# Patient Record
Sex: Male | Born: 1960 | Race: White | Hispanic: No | Marital: Single | Smoking: Current some day smoker
Health system: Southern US, Community
[De-identification: ages and names within clinical notes are randomized; demographics above are authoritative.]

## PROBLEM LIST (undated history)

## (undated) DIAGNOSIS — I872 Venous insufficiency (chronic) (peripheral): Secondary | ICD-10-CM

## (undated) DIAGNOSIS — J449 Chronic obstructive pulmonary disease, unspecified: Secondary | ICD-10-CM

## (undated) DIAGNOSIS — B20 Human immunodeficiency virus [HIV] disease: Secondary | ICD-10-CM

## (undated) DIAGNOSIS — F329 Major depressive disorder, single episode, unspecified: Secondary | ICD-10-CM

## (undated) DIAGNOSIS — Z21 Asymptomatic human immunodeficiency virus [HIV] infection status: Secondary | ICD-10-CM

## (undated) DIAGNOSIS — I472 Ventricular tachycardia, unspecified: Secondary | ICD-10-CM

## (undated) DIAGNOSIS — I341 Nonrheumatic mitral (valve) prolapse: Secondary | ICD-10-CM

## (undated) DIAGNOSIS — L738 Other specified follicular disorders: Secondary | ICD-10-CM

## (undated) DIAGNOSIS — J329 Chronic sinusitis, unspecified: Secondary | ICD-10-CM

## (undated) DIAGNOSIS — J849 Interstitial pulmonary disease, unspecified: Secondary | ICD-10-CM

## (undated) DIAGNOSIS — K7460 Unspecified cirrhosis of liver: Secondary | ICD-10-CM

## (undated) DIAGNOSIS — Q8740 Marfan's syndrome, unspecified: Secondary | ICD-10-CM

## (undated) DIAGNOSIS — H7190 Unspecified cholesteatoma, unspecified ear: Secondary | ICD-10-CM

## (undated) DIAGNOSIS — K219 Gastro-esophageal reflux disease without esophagitis: Secondary | ICD-10-CM

## (undated) DIAGNOSIS — F32A Depression, unspecified: Secondary | ICD-10-CM

## (undated) DIAGNOSIS — B181 Chronic viral hepatitis B without delta-agent: Secondary | ICD-10-CM

## (undated) HISTORY — DX: Venous insufficiency (chronic) (peripheral): I87.2

## (undated) HISTORY — DX: Chronic sinusitis, unspecified: J32.9

## (undated) HISTORY — DX: Chronic viral hepatitis B without delta-agent: B18.1

## (undated) HISTORY — PX: OTHER SURGICAL HISTORY: SHX169

## (undated) HISTORY — DX: Human immunodeficiency virus (HIV) disease: B20

## (undated) HISTORY — DX: Asymptomatic human immunodeficiency virus (hiv) infection status: Z21

## (undated) HISTORY — DX: Unspecified cirrhosis of liver: K74.60

## (undated) HISTORY — DX: Unspecified cholesteatoma, unspecified ear: H71.90

## (undated) HISTORY — DX: Gastro-esophageal reflux disease without esophagitis: K21.9

## (undated) HISTORY — DX: Interstitial pulmonary disease, unspecified: J84.9

## (undated) HISTORY — DX: Other specified follicular disorders: L73.8

## (undated) HISTORY — DX: Chronic obstructive pulmonary disease, unspecified: J44.9

## (undated) HISTORY — DX: Depression, unspecified: F32.A

## (undated) HISTORY — DX: Ventricular tachycardia, unspecified: I47.20

## (undated) HISTORY — DX: Marfan syndrome, unspecified: Q87.40

## (undated) HISTORY — DX: Ventricular tachycardia: I47.2

## (undated) HISTORY — DX: Major depressive disorder, single episode, unspecified: F32.9

## (undated) HISTORY — PX: APPENDECTOMY: SHX54

---

## 1998-04-21 ENCOUNTER — Emergency Department (HOSPITAL_COMMUNITY): Admission: EM | Admit: 1998-04-21 | Discharge: 1998-04-22 | Payer: Self-pay | Admitting: Internal Medicine

## 1999-07-09 ENCOUNTER — Encounter (INDEPENDENT_AMBULATORY_CARE_PROVIDER_SITE_OTHER): Payer: Self-pay | Admitting: *Deleted

## 1999-07-09 LAB — CONVERTED CEMR LAB: CD4 Count: 425 microliters

## 1999-09-29 ENCOUNTER — Inpatient Hospital Stay (HOSPITAL_COMMUNITY): Admission: EM | Admit: 1999-09-29 | Discharge: 1999-09-30 | Payer: Self-pay | Admitting: Emergency Medicine

## 1999-09-29 ENCOUNTER — Encounter: Payer: Self-pay | Admitting: Emergency Medicine

## 2000-03-02 ENCOUNTER — Encounter: Admission: RE | Admit: 2000-03-02 | Discharge: 2000-03-02 | Payer: Self-pay | Admitting: Infectious Diseases

## 2000-03-02 ENCOUNTER — Ambulatory Visit (HOSPITAL_COMMUNITY): Admission: RE | Admit: 2000-03-02 | Discharge: 2000-03-02 | Payer: Self-pay | Admitting: Infectious Diseases

## 2000-05-31 ENCOUNTER — Ambulatory Visit (HOSPITAL_COMMUNITY): Admission: RE | Admit: 2000-05-31 | Discharge: 2000-05-31 | Payer: Self-pay | Admitting: Infectious Diseases

## 2000-05-31 ENCOUNTER — Encounter: Admission: RE | Admit: 2000-05-31 | Discharge: 2000-05-31 | Payer: Self-pay | Admitting: Infectious Diseases

## 2000-08-23 ENCOUNTER — Encounter: Admission: RE | Admit: 2000-08-23 | Discharge: 2000-08-23 | Payer: Self-pay | Admitting: Infectious Diseases

## 2000-08-23 ENCOUNTER — Ambulatory Visit (HOSPITAL_COMMUNITY): Admission: RE | Admit: 2000-08-23 | Discharge: 2000-08-23 | Payer: Self-pay | Admitting: Infectious Diseases

## 2000-10-25 ENCOUNTER — Encounter: Admission: RE | Admit: 2000-10-25 | Discharge: 2000-10-25 | Payer: Self-pay | Admitting: Infectious Diseases

## 2001-04-18 ENCOUNTER — Encounter: Admission: RE | Admit: 2001-04-18 | Discharge: 2001-04-18 | Payer: Self-pay | Admitting: Infectious Diseases

## 2001-04-18 ENCOUNTER — Ambulatory Visit (HOSPITAL_COMMUNITY): Admission: RE | Admit: 2001-04-18 | Discharge: 2001-04-18 | Payer: Self-pay | Admitting: Infectious Diseases

## 2001-07-25 ENCOUNTER — Encounter: Admission: RE | Admit: 2001-07-25 | Discharge: 2001-07-25 | Payer: Self-pay | Admitting: Infectious Diseases

## 2001-07-25 ENCOUNTER — Ambulatory Visit (HOSPITAL_COMMUNITY): Admission: RE | Admit: 2001-07-25 | Discharge: 2001-07-25 | Payer: Self-pay | Admitting: Infectious Diseases

## 2002-01-01 ENCOUNTER — Encounter: Admission: RE | Admit: 2002-01-01 | Discharge: 2002-01-01 | Payer: Self-pay | Admitting: Internal Medicine

## 2002-01-01 ENCOUNTER — Ambulatory Visit (HOSPITAL_COMMUNITY): Admission: RE | Admit: 2002-01-01 | Discharge: 2002-01-01 | Payer: Self-pay | Admitting: Infectious Diseases

## 2002-01-16 ENCOUNTER — Encounter: Admission: RE | Admit: 2002-01-16 | Discharge: 2002-01-16 | Payer: Self-pay | Admitting: Infectious Diseases

## 2002-04-03 ENCOUNTER — Encounter: Admission: RE | Admit: 2002-04-03 | Discharge: 2002-04-03 | Payer: Self-pay | Admitting: Infectious Diseases

## 2002-04-03 ENCOUNTER — Ambulatory Visit (HOSPITAL_COMMUNITY): Admission: RE | Admit: 2002-04-03 | Discharge: 2002-04-03 | Payer: Self-pay | Admitting: Infectious Diseases

## 2002-06-18 ENCOUNTER — Encounter: Payer: Self-pay | Admitting: Emergency Medicine

## 2002-06-18 ENCOUNTER — Emergency Department (HOSPITAL_COMMUNITY): Admission: EM | Admit: 2002-06-18 | Discharge: 2002-06-18 | Payer: Self-pay | Admitting: Emergency Medicine

## 2002-07-08 ENCOUNTER — Ambulatory Visit (HOSPITAL_COMMUNITY): Admission: RE | Admit: 2002-07-08 | Discharge: 2002-07-08 | Payer: Self-pay | Admitting: Infectious Diseases

## 2002-07-08 ENCOUNTER — Encounter: Admission: RE | Admit: 2002-07-08 | Discharge: 2002-07-08 | Payer: Self-pay | Admitting: Infectious Diseases

## 2002-07-24 ENCOUNTER — Ambulatory Visit (HOSPITAL_COMMUNITY): Admission: RE | Admit: 2002-07-24 | Discharge: 2002-07-24 | Payer: Self-pay | Admitting: Infectious Diseases

## 2002-07-24 ENCOUNTER — Encounter: Payer: Self-pay | Admitting: Cardiology

## 2002-08-06 ENCOUNTER — Encounter: Admission: RE | Admit: 2002-08-06 | Discharge: 2002-08-06 | Payer: Self-pay | Admitting: Infectious Diseases

## 2002-10-30 ENCOUNTER — Encounter: Admission: RE | Admit: 2002-10-30 | Discharge: 2002-10-30 | Payer: Self-pay | Admitting: Infectious Diseases

## 2002-10-30 ENCOUNTER — Encounter: Payer: Self-pay | Admitting: Infectious Diseases

## 2003-01-29 ENCOUNTER — Ambulatory Visit (HOSPITAL_COMMUNITY): Admission: RE | Admit: 2003-01-29 | Discharge: 2003-01-29 | Payer: Self-pay | Admitting: Infectious Diseases

## 2003-01-29 ENCOUNTER — Encounter: Payer: Self-pay | Admitting: Infectious Diseases

## 2003-01-29 ENCOUNTER — Encounter: Admission: RE | Admit: 2003-01-29 | Discharge: 2003-01-29 | Payer: Self-pay | Admitting: Infectious Diseases

## 2003-03-11 ENCOUNTER — Emergency Department (HOSPITAL_COMMUNITY): Admission: EM | Admit: 2003-03-11 | Discharge: 2003-03-11 | Payer: Self-pay

## 2003-04-30 ENCOUNTER — Encounter: Admission: RE | Admit: 2003-04-30 | Discharge: 2003-04-30 | Payer: Self-pay | Admitting: Infectious Diseases

## 2003-04-30 ENCOUNTER — Encounter: Payer: Self-pay | Admitting: Infectious Diseases

## 2003-04-30 ENCOUNTER — Ambulatory Visit (HOSPITAL_COMMUNITY): Admission: RE | Admit: 2003-04-30 | Discharge: 2003-04-30 | Payer: Self-pay | Admitting: Infectious Diseases

## 2003-07-30 ENCOUNTER — Encounter: Admission: RE | Admit: 2003-07-30 | Discharge: 2003-07-30 | Payer: Self-pay | Admitting: Infectious Diseases

## 2003-08-06 ENCOUNTER — Ambulatory Visit (HOSPITAL_COMMUNITY): Admission: RE | Admit: 2003-08-06 | Discharge: 2003-08-06 | Payer: Self-pay | Admitting: Infectious Diseases

## 2003-08-13 ENCOUNTER — Encounter: Admission: RE | Admit: 2003-08-13 | Discharge: 2003-08-13 | Payer: Self-pay | Admitting: Infectious Diseases

## 2003-08-15 ENCOUNTER — Encounter (INDEPENDENT_AMBULATORY_CARE_PROVIDER_SITE_OTHER): Payer: Self-pay | Admitting: Cardiovascular Disease

## 2003-08-15 ENCOUNTER — Ambulatory Visit (HOSPITAL_COMMUNITY): Admission: RE | Admit: 2003-08-15 | Discharge: 2003-08-15 | Payer: Self-pay | Admitting: Infectious Diseases

## 2003-08-19 ENCOUNTER — Ambulatory Visit (HOSPITAL_COMMUNITY): Admission: RE | Admit: 2003-08-19 | Discharge: 2003-08-19 | Payer: Self-pay | Admitting: Infectious Diseases

## 2003-10-15 ENCOUNTER — Ambulatory Visit (HOSPITAL_COMMUNITY): Admission: RE | Admit: 2003-10-15 | Discharge: 2003-10-15 | Payer: Self-pay | Admitting: Infectious Diseases

## 2003-10-15 ENCOUNTER — Encounter: Admission: RE | Admit: 2003-10-15 | Discharge: 2003-10-15 | Payer: Self-pay | Admitting: Infectious Diseases

## 2003-10-17 ENCOUNTER — Ambulatory Visit (HOSPITAL_COMMUNITY): Admission: RE | Admit: 2003-10-17 | Discharge: 2003-10-17 | Payer: Self-pay | Admitting: Infectious Diseases

## 2003-11-18 ENCOUNTER — Encounter: Admission: RE | Admit: 2003-11-18 | Discharge: 2003-11-18 | Payer: Self-pay | Admitting: Infectious Diseases

## 2003-11-18 ENCOUNTER — Ambulatory Visit (HOSPITAL_COMMUNITY): Admission: RE | Admit: 2003-11-18 | Discharge: 2003-11-18 | Payer: Self-pay | Admitting: Infectious Diseases

## 2003-11-19 ENCOUNTER — Ambulatory Visit (HOSPITAL_COMMUNITY): Admission: RE | Admit: 2003-11-19 | Discharge: 2003-11-19 | Payer: Self-pay | Admitting: Infectious Diseases

## 2003-12-01 ENCOUNTER — Ambulatory Visit (HOSPITAL_COMMUNITY): Admission: RE | Admit: 2003-12-01 | Discharge: 2003-12-01 | Payer: Self-pay | Admitting: Infectious Diseases

## 2003-12-10 ENCOUNTER — Ambulatory Visit (HOSPITAL_COMMUNITY): Admission: RE | Admit: 2003-12-10 | Discharge: 2003-12-10 | Payer: Self-pay | Admitting: Infectious Diseases

## 2003-12-10 ENCOUNTER — Encounter: Admission: RE | Admit: 2003-12-10 | Discharge: 2003-12-10 | Payer: Self-pay | Admitting: Infectious Diseases

## 2004-03-17 ENCOUNTER — Ambulatory Visit (HOSPITAL_COMMUNITY): Admission: RE | Admit: 2004-03-17 | Discharge: 2004-03-17 | Payer: Self-pay | Admitting: Infectious Diseases

## 2004-03-17 ENCOUNTER — Ambulatory Visit: Payer: Self-pay | Admitting: Infectious Diseases

## 2004-03-31 ENCOUNTER — Ambulatory Visit (HOSPITAL_COMMUNITY): Admission: RE | Admit: 2004-03-31 | Discharge: 2004-03-31 | Payer: Self-pay | Admitting: Infectious Diseases

## 2004-04-02 ENCOUNTER — Ambulatory Visit (HOSPITAL_COMMUNITY): Admission: RE | Admit: 2004-04-02 | Discharge: 2004-04-02 | Payer: Self-pay | Admitting: Infectious Diseases

## 2004-04-14 ENCOUNTER — Ambulatory Visit: Payer: Self-pay | Admitting: Infectious Diseases

## 2004-05-04 ENCOUNTER — Ambulatory Visit (HOSPITAL_COMMUNITY): Admission: RE | Admit: 2004-05-04 | Discharge: 2004-05-04 | Payer: Self-pay | Admitting: Infectious Diseases

## 2004-05-04 ENCOUNTER — Ambulatory Visit: Payer: Self-pay | Admitting: Infectious Diseases

## 2004-05-14 ENCOUNTER — Ambulatory Visit: Payer: Self-pay | Admitting: Infectious Diseases

## 2004-05-19 ENCOUNTER — Ambulatory Visit: Payer: Self-pay | Admitting: Infectious Diseases

## 2004-05-21 ENCOUNTER — Ambulatory Visit (HOSPITAL_COMMUNITY): Admission: RE | Admit: 2004-05-21 | Discharge: 2004-05-21 | Payer: Self-pay | Admitting: Infectious Diseases

## 2004-05-24 ENCOUNTER — Ambulatory Visit: Payer: Self-pay | Admitting: Internal Medicine

## 2005-01-21 ENCOUNTER — Emergency Department (HOSPITAL_COMMUNITY): Admission: EM | Admit: 2005-01-21 | Discharge: 2005-01-22 | Payer: Self-pay | Admitting: Emergency Medicine

## 2005-02-02 ENCOUNTER — Ambulatory Visit: Payer: Self-pay | Admitting: Infectious Diseases

## 2005-02-02 ENCOUNTER — Ambulatory Visit (HOSPITAL_COMMUNITY): Admission: RE | Admit: 2005-02-02 | Discharge: 2005-02-02 | Payer: Self-pay | Admitting: Infectious Diseases

## 2005-02-07 ENCOUNTER — Ambulatory Visit: Payer: Self-pay | Admitting: Infectious Diseases

## 2005-02-09 ENCOUNTER — Ambulatory Visit: Payer: Self-pay | Admitting: Infectious Diseases

## 2005-02-09 ENCOUNTER — Ambulatory Visit (HOSPITAL_COMMUNITY): Admission: RE | Admit: 2005-02-09 | Discharge: 2005-02-09 | Payer: Self-pay | Admitting: Infectious Diseases

## 2005-03-18 ENCOUNTER — Emergency Department (HOSPITAL_COMMUNITY): Admission: EM | Admit: 2005-03-18 | Discharge: 2005-03-18 | Payer: Self-pay | Admitting: Emergency Medicine

## 2005-03-30 ENCOUNTER — Ambulatory Visit (HOSPITAL_COMMUNITY): Admission: RE | Admit: 2005-03-30 | Discharge: 2005-03-30 | Payer: Self-pay | Admitting: Internal Medicine

## 2005-03-30 ENCOUNTER — Ambulatory Visit: Payer: Self-pay | Admitting: Infectious Diseases

## 2005-03-30 ENCOUNTER — Ambulatory Visit: Payer: Self-pay | Admitting: Internal Medicine

## 2005-03-31 ENCOUNTER — Inpatient Hospital Stay (HOSPITAL_COMMUNITY): Admission: AD | Admit: 2005-03-31 | Discharge: 2005-04-04 | Payer: Self-pay | Admitting: Hospitalist

## 2005-03-31 ENCOUNTER — Ambulatory Visit: Payer: Self-pay | Admitting: Hospitalist

## 2005-04-01 ENCOUNTER — Ambulatory Visit: Payer: Self-pay | Admitting: Cardiology

## 2005-04-01 ENCOUNTER — Encounter: Payer: Self-pay | Admitting: Cardiology

## 2005-04-11 ENCOUNTER — Ambulatory Visit: Payer: Self-pay | Admitting: Hospitalist

## 2005-04-20 ENCOUNTER — Ambulatory Visit: Payer: Self-pay | Admitting: Infectious Diseases

## 2005-04-20 ENCOUNTER — Ambulatory Visit (HOSPITAL_COMMUNITY): Admission: RE | Admit: 2005-04-20 | Discharge: 2005-04-20 | Payer: Self-pay | Admitting: Infectious Diseases

## 2005-04-26 ENCOUNTER — Ambulatory Visit: Payer: Self-pay | Admitting: Infectious Diseases

## 2005-04-27 ENCOUNTER — Ambulatory Visit: Payer: Self-pay | Admitting: Internal Medicine

## 2005-05-04 ENCOUNTER — Ambulatory Visit: Payer: Self-pay | Admitting: Internal Medicine

## 2005-05-09 ENCOUNTER — Inpatient Hospital Stay (HOSPITAL_COMMUNITY): Admission: AD | Admit: 2005-05-09 | Discharge: 2005-05-14 | Payer: Self-pay | Admitting: Infectious Diseases

## 2005-05-09 ENCOUNTER — Ambulatory Visit: Payer: Self-pay | Admitting: Infectious Diseases

## 2005-05-10 ENCOUNTER — Ambulatory Visit: Payer: Self-pay | Admitting: Gastroenterology

## 2005-05-12 ENCOUNTER — Ambulatory Visit: Payer: Self-pay | Admitting: Infectious Diseases

## 2005-05-18 ENCOUNTER — Ambulatory Visit: Payer: Self-pay | Admitting: Infectious Diseases

## 2005-06-01 ENCOUNTER — Ambulatory Visit (HOSPITAL_COMMUNITY): Admission: RE | Admit: 2005-06-01 | Discharge: 2005-06-01 | Payer: Self-pay | Admitting: Infectious Diseases

## 2005-06-01 ENCOUNTER — Ambulatory Visit: Payer: Self-pay | Admitting: Infectious Diseases

## 2005-07-09 ENCOUNTER — Emergency Department (HOSPITAL_COMMUNITY): Admission: EM | Admit: 2005-07-09 | Discharge: 2005-07-09 | Payer: Self-pay | Admitting: Emergency Medicine

## 2005-07-18 ENCOUNTER — Ambulatory Visit: Payer: Self-pay | Admitting: Infectious Diseases

## 2005-07-18 ENCOUNTER — Ambulatory Visit (HOSPITAL_COMMUNITY): Admission: RE | Admit: 2005-07-18 | Discharge: 2005-07-18 | Payer: Self-pay | Admitting: Infectious Diseases

## 2005-09-28 ENCOUNTER — Ambulatory Visit: Payer: Self-pay | Admitting: Infectious Diseases

## 2005-09-28 ENCOUNTER — Encounter: Admission: RE | Admit: 2005-09-28 | Discharge: 2005-09-28 | Payer: Self-pay | Admitting: Infectious Diseases

## 2005-11-22 ENCOUNTER — Ambulatory Visit: Payer: Self-pay | Admitting: Infectious Diseases

## 2005-11-22 ENCOUNTER — Encounter: Admission: RE | Admit: 2005-11-22 | Discharge: 2005-11-22 | Payer: Self-pay | Admitting: Infectious Diseases

## 2005-12-13 ENCOUNTER — Emergency Department (HOSPITAL_COMMUNITY): Admission: EM | Admit: 2005-12-13 | Discharge: 2005-12-13 | Payer: Self-pay | Admitting: Emergency Medicine

## 2005-12-28 ENCOUNTER — Ambulatory Visit: Payer: Self-pay | Admitting: Internal Medicine

## 2006-01-18 ENCOUNTER — Ambulatory Visit (HOSPITAL_COMMUNITY): Admission: RE | Admit: 2006-01-18 | Discharge: 2006-01-18 | Payer: Self-pay | Admitting: Internal Medicine

## 2006-01-18 ENCOUNTER — Ambulatory Visit: Payer: Self-pay | Admitting: Internal Medicine

## 2006-01-25 ENCOUNTER — Encounter (INDEPENDENT_AMBULATORY_CARE_PROVIDER_SITE_OTHER): Payer: Self-pay | Admitting: *Deleted

## 2006-01-25 ENCOUNTER — Ambulatory Visit: Payer: Self-pay | Admitting: Infectious Diseases

## 2006-01-25 ENCOUNTER — Encounter: Admission: RE | Admit: 2006-01-25 | Discharge: 2006-01-25 | Payer: Self-pay | Admitting: Infectious Diseases

## 2006-01-25 LAB — CONVERTED CEMR LAB
CD4 Count: 200 microliters
HIV 1 RNA Quant: 399 copies/mL

## 2006-02-20 ENCOUNTER — Ambulatory Visit: Payer: Self-pay | Admitting: Internal Medicine

## 2006-02-20 ENCOUNTER — Encounter: Payer: Self-pay | Admitting: Infectious Diseases

## 2006-03-03 ENCOUNTER — Ambulatory Visit: Payer: Self-pay | Admitting: Internal Medicine

## 2006-03-22 ENCOUNTER — Encounter: Admission: RE | Admit: 2006-03-22 | Discharge: 2006-03-22 | Payer: Self-pay | Admitting: Infectious Diseases

## 2006-03-22 ENCOUNTER — Encounter (INDEPENDENT_AMBULATORY_CARE_PROVIDER_SITE_OTHER): Payer: Self-pay | Admitting: *Deleted

## 2006-03-22 ENCOUNTER — Ambulatory Visit: Payer: Self-pay | Admitting: Infectious Diseases

## 2006-03-22 LAB — CONVERTED CEMR LAB
CD4 Count: 180 microliters
HIV 1 RNA Quant: 49 copies/mL

## 2006-04-13 DIAGNOSIS — J440 Chronic obstructive pulmonary disease with acute lower respiratory infection: Secondary | ICD-10-CM

## 2006-04-13 DIAGNOSIS — F329 Major depressive disorder, single episode, unspecified: Secondary | ICD-10-CM

## 2006-04-13 DIAGNOSIS — E1142 Type 2 diabetes mellitus with diabetic polyneuropathy: Secondary | ICD-10-CM

## 2006-04-13 DIAGNOSIS — J209 Acute bronchitis, unspecified: Secondary | ICD-10-CM

## 2006-04-13 DIAGNOSIS — B20 Human immunodeficiency virus [HIV] disease: Secondary | ICD-10-CM

## 2006-04-13 DIAGNOSIS — Q8740 Marfan's syndrome, unspecified: Secondary | ICD-10-CM

## 2006-04-13 DIAGNOSIS — F32A Depression, unspecified: Secondary | ICD-10-CM | POA: Insufficient documentation

## 2006-04-13 DIAGNOSIS — G589 Mononeuropathy, unspecified: Secondary | ICD-10-CM

## 2006-04-13 DIAGNOSIS — Z8619 Personal history of other infectious and parasitic diseases: Secondary | ICD-10-CM

## 2006-06-21 ENCOUNTER — Encounter (INDEPENDENT_AMBULATORY_CARE_PROVIDER_SITE_OTHER): Payer: Self-pay | Admitting: *Deleted

## 2006-06-21 ENCOUNTER — Encounter: Admission: RE | Admit: 2006-06-21 | Discharge: 2006-06-21 | Payer: Self-pay | Admitting: Infectious Diseases

## 2006-06-21 ENCOUNTER — Ambulatory Visit: Payer: Self-pay | Admitting: Infectious Diseases

## 2006-06-21 LAB — CONVERTED CEMR LAB
ALT: 9 units/L (ref 0–53)
AST: 14 units/L (ref 0–37)
Albumin: 4.4 g/dL (ref 3.5–5.2)
Alkaline Phosphatase: 211 units/L — ABNORMAL HIGH (ref 39–117)
BUN: 10 mg/dL (ref 6–23)
CD4 Count: 250 microliters
CO2: 24 meq/L (ref 19–32)
Calcium: 9.3 mg/dL (ref 8.4–10.5)
Chloride: 103 meq/L (ref 96–112)
Creatinine, Ser: 0.78 mg/dL (ref 0.40–1.50)
Glucose, Bld: 76 mg/dL (ref 70–99)
HCT: 43.6 % (ref 41.0–49.0)
HIV 1 RNA Quant: 49 copies/mL
HIV 1 RNA Quant: <50 copies/mL (ref <50)
HIV-1 RNA Quant, Log: <1.7 (ref <1.70)
Hemoglobin: 14.6 g/dL (ref 13.9–16.8)
MCHC: 33.5 g/dL (ref 33.1–35.4)
MCV: 88.6 fL (ref 78.8–100.0)
Platelets: 248 10*3/uL (ref 152–374)
Potassium: 4.1 meq/L (ref 3.5–5.3)
RBC: 4.92 M/uL (ref 4.20–5.50)
RDW: 14.3 % (ref 11.5–15.3)
Sodium: 138 meq/L (ref 135–145)
Total Bilirubin: 0.6 mg/dL (ref 0.3–1.2)
Total Protein: 8.3 g/dL (ref 6.0–8.3)
WBC: 8.1 10*3/uL (ref 3.7–10.0)

## 2006-08-02 ENCOUNTER — Ambulatory Visit: Payer: Self-pay | Admitting: Infectious Diseases

## 2006-08-28 ENCOUNTER — Encounter (INDEPENDENT_AMBULATORY_CARE_PROVIDER_SITE_OTHER): Payer: Self-pay | Admitting: *Deleted

## 2006-08-28 LAB — CONVERTED CEMR LAB

## 2006-08-31 ENCOUNTER — Telehealth (INDEPENDENT_AMBULATORY_CARE_PROVIDER_SITE_OTHER): Payer: Self-pay | Admitting: Infectious Diseases

## 2006-09-10 ENCOUNTER — Encounter (INDEPENDENT_AMBULATORY_CARE_PROVIDER_SITE_OTHER): Payer: Self-pay | Admitting: *Deleted

## 2006-09-12 ENCOUNTER — Telehealth (INDEPENDENT_AMBULATORY_CARE_PROVIDER_SITE_OTHER): Payer: Self-pay | Admitting: *Deleted

## 2006-09-12 ENCOUNTER — Telehealth: Payer: Self-pay | Admitting: Infectious Diseases

## 2006-09-13 ENCOUNTER — Encounter: Payer: Self-pay | Admitting: Infectious Diseases

## 2006-10-12 ENCOUNTER — Telehealth: Payer: Self-pay

## 2006-10-16 ENCOUNTER — Telehealth (INDEPENDENT_AMBULATORY_CARE_PROVIDER_SITE_OTHER): Payer: Self-pay | Admitting: *Deleted

## 2006-10-25 ENCOUNTER — Telehealth: Payer: Self-pay | Admitting: Infectious Diseases

## 2006-10-27 ENCOUNTER — Encounter (INDEPENDENT_AMBULATORY_CARE_PROVIDER_SITE_OTHER): Payer: Self-pay | Admitting: *Deleted

## 2006-11-09 ENCOUNTER — Ambulatory Visit: Payer: Self-pay | Admitting: Internal Medicine

## 2006-11-20 ENCOUNTER — Telehealth (INDEPENDENT_AMBULATORY_CARE_PROVIDER_SITE_OTHER): Payer: Self-pay | Admitting: *Deleted

## 2006-11-20 ENCOUNTER — Encounter: Admission: RE | Admit: 2006-11-20 | Discharge: 2006-11-20 | Payer: Self-pay | Admitting: Infectious Diseases

## 2006-11-20 ENCOUNTER — Ambulatory Visit: Payer: Self-pay | Admitting: Infectious Diseases

## 2006-11-20 LAB — CONVERTED CEMR LAB
Alkaline Phosphatase: 197 units/L — ABNORMAL HIGH (ref 39–117)
BUN: 10 mg/dL (ref 6–23)
Cholesterol: 169 mg/dL (ref 0–200)
Creatinine, Ser: 0.84 mg/dL (ref 0.40–1.50)
Glucose, Bld: 112 mg/dL — ABNORMAL HIGH (ref 70–99)
HCT: 43.4 % (ref 39.0–52.0)
HDL: 36 mg/dL — ABNORMAL LOW (ref >39)
Hemoglobin: 14.1 g/dL (ref 13.0–17.0)
LDL Cholesterol: 108 mg/dL — ABNORMAL HIGH (ref 0–99)
MCHC: 32.5 g/dL (ref 30.0–36.0)
MCV: 87.7 fL (ref 78.0–100.0)
RBC: 4.95 M/uL (ref 4.22–5.81)
Sodium: 138 meq/L (ref 135–145)
Total Bilirubin: 0.5 mg/dL (ref 0.3–1.2)
Total CHOL/HDL Ratio: 4.7
Triglycerides: 127 mg/dL (ref <150)
VLDL: 25 mg/dL (ref 0–40)

## 2006-11-24 ENCOUNTER — Encounter (HOSPITAL_BASED_OUTPATIENT_CLINIC_OR_DEPARTMENT_OTHER): Admission: RE | Admit: 2006-11-24 | Discharge: 2006-12-05 | Payer: Self-pay | Admitting: Surgery

## 2006-11-30 ENCOUNTER — Telehealth (INDEPENDENT_AMBULATORY_CARE_PROVIDER_SITE_OTHER): Payer: Self-pay | Admitting: *Deleted

## 2006-11-30 ENCOUNTER — Encounter: Payer: Self-pay | Admitting: Infectious Diseases

## 2006-11-30 ENCOUNTER — Ambulatory Visit (HOSPITAL_COMMUNITY): Admission: RE | Admit: 2006-11-30 | Discharge: 2006-11-30 | Payer: Self-pay | Admitting: Infectious Diseases

## 2006-12-07 ENCOUNTER — Telehealth: Payer: Self-pay | Admitting: Infectious Diseases

## 2006-12-08 ENCOUNTER — Telehealth: Payer: Self-pay | Admitting: Infectious Diseases

## 2006-12-19 ENCOUNTER — Telehealth: Payer: Self-pay | Admitting: Infectious Diseases

## 2006-12-20 ENCOUNTER — Telehealth: Payer: Self-pay | Admitting: *Deleted

## 2006-12-20 ENCOUNTER — Encounter: Payer: Self-pay | Admitting: Infectious Diseases

## 2007-01-01 ENCOUNTER — Telehealth: Payer: Self-pay | Admitting: *Deleted

## 2007-01-16 ENCOUNTER — Telehealth: Payer: Self-pay | Admitting: Infectious Diseases

## 2007-01-18 ENCOUNTER — Encounter: Payer: Self-pay | Admitting: Infectious Diseases

## 2007-02-12 ENCOUNTER — Telehealth (INDEPENDENT_AMBULATORY_CARE_PROVIDER_SITE_OTHER): Payer: Self-pay | Admitting: *Deleted

## 2007-02-14 ENCOUNTER — Encounter: Payer: Self-pay | Admitting: Infectious Diseases

## 2007-03-21 ENCOUNTER — Encounter: Admission: RE | Admit: 2007-03-21 | Discharge: 2007-03-21 | Payer: Self-pay | Admitting: Infectious Diseases

## 2007-03-21 ENCOUNTER — Ambulatory Visit: Payer: Self-pay | Admitting: Infectious Diseases

## 2007-03-21 LAB — CONVERTED CEMR LAB
ALT: 13 units/L (ref 0–53)
AST: 13 units/L (ref 0–37)
Albumin: 4.4 g/dL (ref 3.5–5.2)
Alkaline Phosphatase: 173 units/L — ABNORMAL HIGH (ref 39–117)
BUN: 13 mg/dL (ref 6–23)
CO2: 22 meq/L (ref 19–32)
Calcium: 8.7 mg/dL (ref 8.4–10.5)
Chloride: 103 meq/L (ref 96–112)
Creatinine, Ser: 0.86 mg/dL (ref 0.40–1.50)
Glucose, Bld: 102 mg/dL — ABNORMAL HIGH (ref 70–99)
HCT: 42.2 % (ref 39.0–52.0)
HIV 1 RNA Quant: 88 copies/mL — ABNORMAL HIGH (ref <50)
HIV-1 RNA Quant, Log: 1.94 — ABNORMAL HIGH (ref <1.70)
Hemoglobin: 14.2 g/dL (ref 13.0–17.0)
MCHC: 33.6 g/dL (ref 30.0–36.0)
MCV: 86.3 fL (ref 78.0–100.0)
Platelets: 210 10*3/uL (ref 150–400)
Potassium: 4.4 meq/L (ref 3.5–5.3)
RBC: 4.89 M/uL (ref 4.22–5.81)
RDW: 14.7 % — ABNORMAL HIGH (ref 11.5–14.0)
Sodium: 138 meq/L (ref 135–145)
Total Bilirubin: 0.5 mg/dL (ref 0.3–1.2)
Total Protein: 7.9 g/dL (ref 6.0–8.3)
WBC: 7.9 10*3/uL (ref 4.0–10.5)

## 2007-03-22 ENCOUNTER — Telehealth: Payer: Self-pay | Admitting: *Deleted

## 2007-04-02 ENCOUNTER — Telehealth: Payer: Self-pay | Admitting: Infectious Diseases

## 2007-04-18 ENCOUNTER — Telehealth: Payer: Self-pay | Admitting: Infectious Diseases

## 2007-04-19 ENCOUNTER — Encounter: Payer: Self-pay | Admitting: Infectious Diseases

## 2007-05-07 ENCOUNTER — Encounter: Payer: Self-pay | Admitting: Infectious Diseases

## 2007-05-10 ENCOUNTER — Encounter: Payer: Self-pay | Admitting: Infectious Diseases

## 2007-05-10 ENCOUNTER — Ambulatory Visit (HOSPITAL_COMMUNITY): Admission: RE | Admit: 2007-05-10 | Discharge: 2007-05-10 | Payer: Self-pay | Admitting: Infectious Diseases

## 2007-05-17 ENCOUNTER — Telehealth: Payer: Self-pay | Admitting: Infectious Diseases

## 2007-05-22 ENCOUNTER — Telehealth (INDEPENDENT_AMBULATORY_CARE_PROVIDER_SITE_OTHER): Payer: Self-pay | Admitting: *Deleted

## 2007-06-15 ENCOUNTER — Telehealth (INDEPENDENT_AMBULATORY_CARE_PROVIDER_SITE_OTHER): Payer: Self-pay | Admitting: *Deleted

## 2007-06-18 ENCOUNTER — Telehealth (INDEPENDENT_AMBULATORY_CARE_PROVIDER_SITE_OTHER): Payer: Self-pay | Admitting: *Deleted

## 2007-06-20 ENCOUNTER — Encounter: Admission: RE | Admit: 2007-06-20 | Discharge: 2007-06-20 | Payer: Self-pay | Admitting: Infectious Diseases

## 2007-06-20 ENCOUNTER — Ambulatory Visit: Payer: Self-pay | Admitting: Infectious Diseases

## 2007-06-20 LAB — CONVERTED CEMR LAB
ALT: 11 units/L (ref 0–53)
Albumin: 4.3 g/dL (ref 3.5–5.2)
CO2: 22 meq/L (ref 19–32)
Calcium: 9.2 mg/dL (ref 8.4–10.5)
Chloride: 101 meq/L (ref 96–112)
Glucose, Bld: 97 mg/dL (ref 70–99)
HCT: 42 % (ref 39.0–52.0)
HIV 1 RNA Quant: 63 copies/mL — ABNORMAL HIGH (ref <50)
HIV-1 RNA Quant, Log: 1.8 — ABNORMAL HIGH (ref <1.70)
MCV: 86.8 fL (ref 78.0–100.0)
Platelets: 235 10*3/uL (ref 150–400)
RBC: 4.84 M/uL (ref 4.22–5.81)
Sodium: 138 meq/L (ref 135–145)
Total Bilirubin: 0.4 mg/dL (ref 0.3–1.2)
Total Protein: 7.9 g/dL (ref 6.0–8.3)
WBC: 8.5 10*3/uL (ref 4.0–10.5)

## 2007-07-18 ENCOUNTER — Telehealth: Payer: Self-pay | Admitting: Infectious Diseases

## 2007-08-20 ENCOUNTER — Telehealth (INDEPENDENT_AMBULATORY_CARE_PROVIDER_SITE_OTHER): Payer: Self-pay | Admitting: *Deleted

## 2007-09-13 ENCOUNTER — Telehealth: Payer: Self-pay | Admitting: Infectious Diseases

## 2007-10-22 ENCOUNTER — Ambulatory Visit: Payer: Self-pay | Admitting: Infectious Diseases

## 2007-10-22 ENCOUNTER — Encounter: Admission: RE | Admit: 2007-10-22 | Discharge: 2007-10-22 | Payer: Self-pay | Admitting: Infectious Diseases

## 2007-10-22 LAB — CONVERTED CEMR LAB: HIV 1 RNA Quant: 107 copies/mL — ABNORMAL HIGH (ref <50)

## 2007-10-24 LAB — CONVERTED CEMR LAB
ALT: 15 units/L (ref 0–53)
AST: 13 units/L (ref 0–37)
Albumin: 4.5 g/dL (ref 3.5–5.2)
Alkaline Phosphatase: 169 units/L — ABNORMAL HIGH (ref 39–117)
BUN: 10 mg/dL (ref 6–23)
Calcium: 8.9 mg/dL (ref 8.4–10.5)
Chloride: 105 meq/L (ref 96–112)
HDL: 37 mg/dL — ABNORMAL LOW (ref >39)
Hemoglobin: 14.2 g/dL (ref 13.0–17.0)
LDL Cholesterol: 115 mg/dL — ABNORMAL HIGH (ref 0–99)
MCHC: 32.8 g/dL (ref 30.0–36.0)
Platelets: 239 10*3/uL (ref 150–400)
Potassium: 4.2 meq/L (ref 3.5–5.3)
RDW: 15.1 % (ref 11.5–15.5)
Sodium: 137 meq/L (ref 135–145)
Total Protein: 8 g/dL (ref 6.0–8.3)

## 2007-11-14 ENCOUNTER — Telehealth: Payer: Self-pay | Admitting: Infectious Diseases

## 2007-11-19 ENCOUNTER — Telehealth: Payer: Self-pay | Admitting: Infectious Diseases

## 2007-11-22 DIAGNOSIS — R197 Diarrhea, unspecified: Secondary | ICD-10-CM

## 2007-12-13 ENCOUNTER — Telehealth: Payer: Self-pay | Admitting: Infectious Diseases

## 2007-12-18 ENCOUNTER — Ambulatory Visit: Payer: Self-pay | Admitting: Internal Medicine

## 2007-12-18 DIAGNOSIS — R0789 Other chest pain: Secondary | ICD-10-CM

## 2008-01-10 ENCOUNTER — Telehealth: Payer: Self-pay | Admitting: Infectious Diseases

## 2008-01-15 ENCOUNTER — Telehealth (INDEPENDENT_AMBULATORY_CARE_PROVIDER_SITE_OTHER): Payer: Self-pay | Admitting: *Deleted

## 2008-01-21 ENCOUNTER — Encounter: Admission: RE | Admit: 2008-01-21 | Discharge: 2008-01-21 | Payer: Self-pay | Admitting: Infectious Diseases

## 2008-01-21 ENCOUNTER — Ambulatory Visit: Payer: Self-pay | Admitting: Infectious Diseases

## 2008-01-21 DIAGNOSIS — L97529 Non-pressure chronic ulcer of other part of left foot with unspecified severity: Secondary | ICD-10-CM

## 2008-01-21 DIAGNOSIS — L259 Unspecified contact dermatitis, unspecified cause: Secondary | ICD-10-CM

## 2008-01-21 DIAGNOSIS — E11621 Type 2 diabetes mellitus with foot ulcer: Secondary | ICD-10-CM

## 2008-01-21 LAB — CONVERTED CEMR LAB
AST: 11 units/L (ref 0–37)
Albumin: 4.2 g/dL (ref 3.5–5.2)
Alkaline Phosphatase: 181 units/L — ABNORMAL HIGH (ref 39–117)
Basophils Relative: 0 % (ref 0–1)
Calcium: 9 mg/dL (ref 8.4–10.5)
Chloride: 103 meq/L (ref 96–112)
Eosinophils Absolute: 0.1 10*3/uL (ref 0.0–0.7)
Glucose, Bld: 116 mg/dL — ABNORMAL HIGH (ref 70–99)
Lymphs Abs: 1.5 10*3/uL (ref 0.7–4.0)
MCV: 86.3 fL (ref 78.0–100.0)
Neutro Abs: 4.8 10*3/uL (ref 1.7–7.7)
Neutrophils Relative %: 69 % (ref 43–77)
Platelets: 245 10*3/uL (ref 150–400)
Potassium: 3.8 meq/L (ref 3.5–5.3)
RBC: 4.38 M/uL (ref 4.22–5.81)
Sodium: 139 meq/L (ref 135–145)
Total Protein: 7.6 g/dL (ref 6.0–8.3)
WBC: 6.9 10*3/uL (ref 4.0–10.5)

## 2008-02-01 ENCOUNTER — Telehealth: Payer: Self-pay

## 2008-02-06 ENCOUNTER — Telehealth: Payer: Self-pay | Admitting: Infectious Disease

## 2008-02-20 ENCOUNTER — Ambulatory Visit: Payer: Self-pay | Admitting: Infectious Diseases

## 2008-03-06 ENCOUNTER — Encounter (INDEPENDENT_AMBULATORY_CARE_PROVIDER_SITE_OTHER): Payer: Self-pay | Admitting: *Deleted

## 2008-03-12 ENCOUNTER — Telehealth (INDEPENDENT_AMBULATORY_CARE_PROVIDER_SITE_OTHER): Payer: Self-pay | Admitting: *Deleted

## 2008-03-31 ENCOUNTER — Encounter (INDEPENDENT_AMBULATORY_CARE_PROVIDER_SITE_OTHER): Payer: Self-pay | Admitting: *Deleted

## 2008-04-10 ENCOUNTER — Encounter: Payer: Self-pay | Admitting: Infectious Diseases

## 2008-04-25 ENCOUNTER — Encounter: Payer: Self-pay | Admitting: Infectious Diseases

## 2008-05-05 ENCOUNTER — Encounter (INDEPENDENT_AMBULATORY_CARE_PROVIDER_SITE_OTHER): Payer: Self-pay | Admitting: *Deleted

## 2008-05-08 ENCOUNTER — Telehealth: Payer: Self-pay | Admitting: Infectious Diseases

## 2008-06-09 ENCOUNTER — Telehealth: Payer: Self-pay | Admitting: Infectious Diseases

## 2008-06-16 ENCOUNTER — Ambulatory Visit: Payer: Self-pay | Admitting: Infectious Diseases

## 2008-06-16 LAB — CONVERTED CEMR LAB
ALT: 18 units/L (ref 0–53)
AST: 17 units/L (ref 0–37)
Alkaline Phosphatase: 190 units/L — ABNORMAL HIGH (ref 39–117)
Basophils Absolute: 0 10*3/uL (ref 0.0–0.1)
Basophils Relative: 1 % (ref 0–1)
Eosinophils Absolute: 0.1 10*3/uL (ref 0.0–0.7)
Eosinophils Relative: 1 % (ref 0–5)
Glucose, Bld: 101 mg/dL — ABNORMAL HIGH (ref 70–99)
HCT: 44.4 % (ref 39.0–52.0)
HIV-1 RNA Quant, Log: 1.83 — ABNORMAL HIGH (ref <1.68)
MCV: 86.7 fL (ref 78.0–100.0)
Platelets: 232 10*3/uL (ref 150–400)
RDW: 14.7 % (ref 11.5–15.5)
Sodium: 134 meq/L — ABNORMAL LOW (ref 135–145)
Total Bilirubin: 0.5 mg/dL (ref 0.3–1.2)
Total Protein: 8.1 g/dL (ref 6.0–8.3)

## 2008-07-07 ENCOUNTER — Ambulatory Visit: Payer: Self-pay | Admitting: Infectious Diseases

## 2008-07-23 ENCOUNTER — Ambulatory Visit: Payer: Self-pay | Admitting: Cardiovascular Disease

## 2008-07-23 ENCOUNTER — Encounter: Payer: Self-pay | Admitting: Infectious Diseases

## 2008-07-23 ENCOUNTER — Ambulatory Visit (HOSPITAL_COMMUNITY): Admission: RE | Admit: 2008-07-23 | Discharge: 2008-07-23 | Payer: Self-pay | Admitting: Infectious Diseases

## 2008-08-05 ENCOUNTER — Telehealth: Payer: Self-pay | Admitting: Infectious Diseases

## 2008-09-05 ENCOUNTER — Telehealth: Payer: Self-pay | Admitting: Infectious Diseases

## 2008-09-18 ENCOUNTER — Telehealth: Payer: Self-pay

## 2008-11-04 ENCOUNTER — Telehealth: Payer: Self-pay | Admitting: Infectious Diseases

## 2008-11-18 ENCOUNTER — Ambulatory Visit: Payer: Self-pay | Admitting: Infectious Diseases

## 2008-11-18 LAB — CONVERTED CEMR LAB
ALT: 13 units/L (ref 0–53)
Basophils Absolute: 0 10*3/uL (ref 0.0–0.1)
Basophils Relative: 0 % (ref 0–1)
CO2: 22 meq/L (ref 19–32)
Calcium: 9.3 mg/dL (ref 8.4–10.5)
Chloride: 102 meq/L (ref 96–112)
Cholesterol: 201 mg/dL — ABNORMAL HIGH (ref 0–200)
Creatinine, Ser: 0.84 mg/dL (ref 0.40–1.50)
GFR calc Af Amer: >60 mL/min (ref >60)
HIV 1 RNA Quant: 135 copies/mL — ABNORMAL HIGH (ref <48)
HIV-1 RNA Quant, Log: 2.13 — ABNORMAL HIGH (ref <1.68)
MCHC: 33.7 g/dL (ref 30.0–36.0)
Monocytes Relative: 5 % (ref 3–12)
Neutro Abs: 6.2 10*3/uL (ref 1.7–7.7)
Neutrophils Relative %: 73 % (ref 43–77)
RBC: 4.92 M/uL (ref 4.22–5.81)
RDW: 14.7 % (ref 11.5–15.5)

## 2008-12-10 ENCOUNTER — Ambulatory Visit: Payer: Self-pay | Admitting: Infectious Diseases

## 2008-12-10 ENCOUNTER — Emergency Department (HOSPITAL_COMMUNITY): Admission: EM | Admit: 2008-12-10 | Discharge: 2008-12-10 | Payer: Self-pay | Admitting: Emergency Medicine

## 2008-12-10 DIAGNOSIS — S61209A Unspecified open wound of unspecified finger without damage to nail, initial encounter: Secondary | ICD-10-CM | POA: Insufficient documentation

## 2008-12-23 ENCOUNTER — Telehealth (INDEPENDENT_AMBULATORY_CARE_PROVIDER_SITE_OTHER): Payer: Self-pay | Admitting: *Deleted

## 2009-01-06 ENCOUNTER — Telehealth: Payer: Self-pay | Admitting: Infectious Diseases

## 2009-03-10 ENCOUNTER — Telehealth: Payer: Self-pay | Admitting: Infectious Disease

## 2009-03-25 ENCOUNTER — Ambulatory Visit: Payer: Self-pay | Admitting: Infectious Diseases

## 2009-03-25 LAB — CONVERTED CEMR LAB
ALT: 295 units/L — ABNORMAL HIGH (ref 0–53)
Albumin: 3.7 g/dL (ref 3.5–5.2)
Alkaline Phosphatase: 141 units/L — ABNORMAL HIGH (ref 39–117)
CO2: 21 meq/L (ref 19–32)
Glucose, Bld: 122 mg/dL — ABNORMAL HIGH (ref 70–99)
Hemoglobin: 13.8 g/dL (ref 13.0–17.0)
Lymphocytes Relative: 31 % (ref 12–46)
Monocytes Absolute: 0.4 10*3/uL (ref 0.1–1.0)
Monocytes Relative: 6 % (ref 3–12)
Neutro Abs: 3.8 10*3/uL (ref 1.7–7.7)
Potassium: 3.8 meq/L (ref 3.5–5.3)
RBC: 4.91 M/uL (ref 4.22–5.81)
Sodium: 132 meq/L — ABNORMAL LOW (ref 135–145)
Total Protein: 8.9 g/dL — ABNORMAL HIGH (ref 6.0–8.3)
WBC: 6.5 10*3/uL (ref 4.0–10.5)

## 2009-04-01 LAB — CONVERTED CEMR LAB: HIV 1 RNA Quant: 3060 copies/mL — ABNORMAL HIGH (ref <48)

## 2009-08-03 ENCOUNTER — Telehealth: Payer: Self-pay | Admitting: Infectious Diseases

## 2009-08-04 ENCOUNTER — Encounter: Payer: Self-pay | Admitting: Infectious Diseases

## 2009-08-05 ENCOUNTER — Encounter (INDEPENDENT_AMBULATORY_CARE_PROVIDER_SITE_OTHER): Payer: Self-pay | Admitting: *Deleted

## 2009-08-11 ENCOUNTER — Ambulatory Visit: Payer: Self-pay | Admitting: Infectious Diseases

## 2009-08-11 DIAGNOSIS — L97909 Non-pressure chronic ulcer of unspecified part of unspecified lower leg with unspecified severity: Secondary | ICD-10-CM

## 2009-08-11 DIAGNOSIS — I83009 Varicose veins of unspecified lower extremity with ulcer of unspecified site: Secondary | ICD-10-CM

## 2009-08-11 LAB — CONVERTED CEMR LAB
AST: 23 units/L (ref 0–37)
BUN: 11 mg/dL (ref 6–23)
Calcium: 9.2 mg/dL (ref 8.4–10.5)
Chloride: 99 meq/L (ref 96–112)
Cholesterol: 150 mg/dL (ref 0–200)
Creatinine, Ser: 0.83 mg/dL (ref 0.40–1.50)
Eosinophils Relative: 2 % (ref 0–5)
Glucose, Bld: 105 mg/dL — ABNORMAL HIGH (ref 70–99)
HCT: 42.7 % (ref 39.0–52.0)
HDL: 25 mg/dL — ABNORMAL LOW (ref >39)
Hemoglobin: 14.2 g/dL (ref 13.0–17.0)
LDL Cholesterol: 97 mg/dL (ref 0–99)
Lymphocytes Relative: 17 % (ref 12–46)
Lymphs Abs: 1 10*3/uL (ref 0.7–4.0)
Monocytes Absolute: 0.4 10*3/uL (ref 0.1–1.0)
Monocytes Relative: 6 % (ref 3–12)
Neutro Abs: 4.6 10*3/uL (ref 1.7–7.7)
RBC: 5.11 M/uL (ref 4.22–5.81)
RDW: 15.4 % (ref 11.5–15.5)
Total CHOL/HDL Ratio: 6
Triglycerides: 140 mg/dL (ref <150)
VLDL: 28 mg/dL (ref 0–40)
WBC: 6.2 10*3/uL (ref 4.0–10.5)

## 2009-08-19 ENCOUNTER — Ambulatory Visit (HOSPITAL_COMMUNITY): Admission: RE | Admit: 2009-08-19 | Discharge: 2009-08-19 | Payer: Self-pay | Admitting: Infectious Diseases

## 2009-08-19 ENCOUNTER — Encounter: Payer: Self-pay | Admitting: Infectious Diseases

## 2009-08-19 ENCOUNTER — Ambulatory Visit: Payer: Self-pay | Admitting: Cardiology

## 2009-10-09 ENCOUNTER — Ambulatory Visit: Payer: Self-pay | Admitting: Infectious Diseases

## 2009-11-25 ENCOUNTER — Ambulatory Visit: Payer: Self-pay | Admitting: Infectious Diseases

## 2010-01-06 ENCOUNTER — Telehealth: Payer: Self-pay | Admitting: Internal Medicine

## 2010-01-25 ENCOUNTER — Ambulatory Visit: Payer: Self-pay | Admitting: Infectious Diseases

## 2010-01-25 DIAGNOSIS — F17210 Nicotine dependence, cigarettes, uncomplicated: Secondary | ICD-10-CM

## 2010-01-26 ENCOUNTER — Encounter: Payer: Self-pay | Admitting: Infectious Diseases

## 2010-01-26 LAB — CONVERTED CEMR LAB
HIV 1 RNA Quant: 29600 {copies}/mL — ABNORMAL HIGH (ref <48)
HIV-1 RNA Quant, Log: 4.47 — ABNORMAL HIGH (ref <1.68)

## 2010-03-19 ENCOUNTER — Telehealth: Payer: Self-pay | Admitting: Internal Medicine

## 2010-04-28 ENCOUNTER — Ambulatory Visit (HOSPITAL_COMMUNITY): Admission: RE | Admit: 2010-04-28 | Discharge: 2010-04-28 | Payer: Self-pay | Admitting: Infectious Diseases

## 2010-04-28 ENCOUNTER — Ambulatory Visit: Payer: Self-pay | Admitting: Infectious Diseases

## 2010-04-28 LAB — CONVERTED CEMR LAB
Albumin: 3.9 g/dL (ref 3.5–5.2)
Alkaline Phosphatase: 102 units/L (ref 39–117)
BUN: 6 mg/dL (ref 6–23)
CO2: 25 meq/L (ref 19–32)
Eosinophils Absolute: 0.1 10*3/uL (ref 0.0–0.7)
Eosinophils Relative: 2 % (ref 0–5)
Glucose, Bld: 84 mg/dL (ref 70–99)
HCT: 38 % — ABNORMAL LOW (ref 39.0–52.0)
HIV 1 RNA Quant: 59000 copies/mL — ABNORMAL HIGH (ref <20)
HIV-1 RNA Quant, Log: 4.77 — ABNORMAL HIGH (ref <1.30)
Lymphocytes Relative: 18 % (ref 12–46)
Lymphs Abs: 1 10*3/uL (ref 0.7–4.0)
MCV: 82.6 fL (ref 78.0–100.0)
Monocytes Relative: 6 % (ref 3–12)
Platelets: 164 10*3/uL (ref 150–400)
Potassium: 3.8 meq/L (ref 3.5–5.3)
RBC: 4.6 M/uL (ref 4.22–5.81)
Sodium: 139 meq/L (ref 135–145)
Total Protein: 8.1 g/dL (ref 6.0–8.3)
WBC: 5.6 10*3/uL (ref 4.0–10.5)

## 2010-05-31 ENCOUNTER — Encounter (INDEPENDENT_AMBULATORY_CARE_PROVIDER_SITE_OTHER): Payer: Self-pay | Admitting: *Deleted

## 2010-07-25 ENCOUNTER — Encounter: Payer: Self-pay | Admitting: Infectious Diseases

## 2010-08-03 NOTE — Miscellaneous (Signed)
  Clinical Lists Changes  Observations: Added Tekesha Almgren observation of YEARAIDSPOS: 2006  (05/31/2010 16:32)

## 2010-08-03 NOTE — Medication Information (Signed)
Summary: [facility]: RX  [facility]   Imported By: Florinda Marker 08/04/2009 14:17:01  _____________________________________________________________________  External Attachment:    Type:   Image     Comment:   External Document

## 2010-08-03 NOTE — Progress Notes (Signed)
Summary: cough  Phone Note Call from Patient   Caller: Patient Call For: Johny Sax MD Summary of Call: Patient called stating that he has been coughing all night, and he can't afford otc medication but rx would be free on his insurance. The patient is requesting tussinex for cough called in to [facility] on [phone] Initial call taken by: Starleen Arms CMA,  March 19, 2010 3:53 PM  Follow-up for Phone Call        ok Follow-up by: Yisroel Ramming MD,  March 19, 2010 4:25 PM    New/Updated Medications: Sandria Senter ER 10-8 MG/5ML LQCR (HYDROCOD POLST-CHLORPHEN POLST) 1 teaspoon every 12 hours Prescriptions: TUSSIONEX PENNKINETIC ER 10-8 MG/5ML LQCR (HYDROCOD POLST-CHLORPHEN POLST) 1 teaspoon every 12 hours  #64ml x 0   Entered by:   Starleen Arms CMA   Authorized by:   Yisroel Ramming MD   Signed by:   Starleen Arms CMA on 03/19/2010   Method used:   Telephoned to ...       [facility] [address] [phone]* (retail)       [address] [place], [place]  [address]       Ph: [phone]       Fax: [phone]   RxID:   [id number]

## 2010-08-03 NOTE — Progress Notes (Signed)
Summary: change in medication--Protonix not on formulary.  Phone Note Other Incoming   Caller: Fax from [facility] patient's insurere Summary of Call: Received a fax from patient's insurance stating that beginning July 04, 2009, [facility] will no longer cover Protonix.  They suggest physician change medication to Pantorprazole 40mg  in which will be covered under plan in tier 2.  Dr. Ninetta Lights changed medication to Omeprazole 40mg  #90 one a day.  Form scanned in EMR. Pharmacy and patient notified..  Drug changed as well in EMR. Initial call taken by: Paulo Fruit  BS,CPht II,MPH,  August 03, 2009 3:40 PM    New/Updated Medications: OMEPRAZOLE 40 MG CPDR (OMEPRAZOLE) Take 1 capsule by mouth once a day Prescriptions: OMEPRAZOLE 40 MG CPDR (OMEPRAZOLE) Take 1 capsule by mouth once a day  #90 x 3   Entered by:   Paulo Fruit  BS,CPht II,MPH   Authorized by:   Johny Sax MD   Signed by:   Paulo Fruit  BS,CPht II,MPH on 08/03/2009   Method used:   Electronically to        [facility] [phone]* (retail)       [address]       [place], [place]  [address]       Ph: [phone]       Fax: [phone]   RxID:   [id number]  Left message for patient to call office so he can be informed of the change.  Paulo Fruit  BS,CPht II,MPH  August 03, 2009 3:46 PM  Appended Document: change in medication--Protonix not on formulary. Patient called back and was told that his Medicare-D plan stopped covering the protonix so Ninetta Lights changed his medication to Omeprazole .I informed patient so he will know that he has something different waiting to be picked up at his pharmacy. Patient was very grateful that I called to let him know. He thought I called to tell him he missed his lab appt.  He wanted to be transferred to reshedule in which I did do for him.

## 2010-08-03 NOTE — Assessment & Plan Note (Signed)
Summary: 1 month f/u cfb   CC:  1 month follow up.  History of Present Illness: 50 yo M with PMH significant for neuropathy, HIV+, DM that is diet controlled, Marfan's syndrome, and COPD.   CD4 150, VL 31,900 (08-11-2009). Is having feeling of GERD, fullness in throat. Having trouble getting transportation, trouble sleeping, conflict with psychiatrist at [facility].   TTE (08-2009): Normal LV size with mild LV hypertrophy. EF 55-60%. There was mild       mitral regurgitation without significant mitral valve prolapse.       The aortic root was mildly dilated. The RV appeared normal. Given       known Marfan Syndrome, would consider MRA of full thoracic aorta       if this has not already been done.  Preventive Screening-Counseling & Management  Alcohol-Tobacco     Alcohol drinks/day: 0     Smoking Status: quit < 6 months     Smoking Cessation Counseling: yes     Packs/Day: 1.0     Year Started: started at the age of 29     Passive Smoke Exposure: no  Caffeine-Diet-Exercise     Caffeine use/day: sodas everyday     Does Patient Exercise: no     Type of exercise: walking     Times/week: 8+  Safety-Violence-Falls     Seat Belt Use: yes   Updated Prior Medication List: * VITAMIN E  * VITAMIN C  * FLAXSEED  OXYCONTIN 40 MG TB12 (OXYCODONE HCL) by mouth q 12h PROPRANOLOL HCL 20 MG TABS (PROPRANOLOL HCL) by mouth two times a day KLONOPIN 2 MG TABS (CLONAZEPAM) Take 1 tablet by mouth at bedtime SPIRONOLACTONE 25 MG TABS (SPIRONOLACTONE) Take one tablet by mouth once a day XANAX 0.5 MG TABS (ALPRAZOLAM) Take one tablet by mouth every  8 hours as needed FUROSEMIDE 40 MG  TABS (FUROSEMIDE) Take 1 tablet by mouth once a day OMEPRAZOLE 40 MG CPDR (OMEPRAZOLE) Take 1 capsule by mouth once a day PROMETHAZINE HCL 25 MG TABS (PROMETHAZINE HCL) Take 1 tablet by mouth three times a day as needed nausea  Current Allergies (reviewed today): ! TETRACYCLINE HCL (TETRACYCLINE HCL) Past  History:  Past medical, surgical, family and social histories (including risk factors) reviewed, and no changes noted (except as noted below).  Past Medical History: HIV+     Genotype 08-11-09 (L10V, Y188l) Marfan's Diet controlled DM2 COPD Depression Hepatitis B, hx of Mitral Stenosis (area .12) 1-10  Family History: Reviewed history from 01/21/2008 and no changes required. sister is local, involved. dad on home O2.  Social History: Reviewed history from 12/10/2008 and no changes required. Recently quit.   smoked for 32 years with  a 2 1/2 packs per day.  Prev 1 PPD.  Review of Systems       wt up 5#, feels like he has fluid in his lungs.   Vital Signs:  Patient profile:   50 year old male Height:      76 inches (193.04 cm) Weight:      246.4 pounds (112 kg) BMI:     30.10 Temp:     98.2 degrees F (36.78 degrees C) oral Pulse rate:   96 / minute BP sitting:   131 / 88  (right arm)  Vitals Entered By: Baxter Hire) (October 09, 2009 11:38 AM) CC: 1 month follow up Is Patient Diabetic? No Pain Assessment Patient in pain? yes     Location: feet Intensity: 2  Type: aching Onset of pain  Constant Nutritional Status BMI of > 30 = obese Nutritional Status Detail appetite is fine per patient  Have you ever been in a relationship where you felt threatened, hurt or afraid?No   Does patient need assistance? Functional Status Self care Ambulation Normal   Physical Exam  General:  well-developed, well-nourished, well-hydrated, and overweight-appearing.   Eyes:  pupils equal, pupils round, and pupils reactive to light.   Mouth:  pharynx pink and moist and no exudates.   Neck:  no masses.   Lungs:  normal respiratory effort.  basilar crackles Heart:  normal rate, regular rhythm, and grade  2/6 HSM.   Abdomen:  soft, non-tender, normal bowel sounds, and distended.   Psych:  depressed affect.  denies SI. not sure why he should restart ART.    Impression &  Recommendations:  Problem # 1:  HIV INFECTION (ICD-042)  will restart him on ART. He is amenable to this. will give him TRV/ISS due to his liver dysfunction. will have him back in 3-4 weeks. will need to see maria in the intervening period.   Orders: Est. Patient Level IV ([address])  Problem # 2:  DEPRESSION (ICD-311)  he is not suicidal but he is depressed. he needs to f/u with his psych at [facility] he is getting overloaded emotionally.  The following medications were removed from the medication list:    Nortriptyline Hcl Caps (Nortriptyline hcl caps) .Marland Kitchen... 125 mg by mouth at bedtime His updated medication list for this problem includes:    Klonopin 2 Mg Tabs (Clonazepam) .Marland Kitchen... Take 1 tablet by mouth at bedtime    Xanax 0.5 Mg Tabs (Alprazolam) .Marland Kitchen... Take one tablet by mouth every  8 hours as needed  Orders: Est. Patient Level IV ([address])  Medications Added to Medication List This Visit: 1)  Omeprazole 40 Mg Cpdr (Omeprazole) .... Take 1 capsule by mouth once a day 2)  Truvada 200-300 Mg Tabs (Emtricitabine-tenofovir) .... Take 1 tablet by mouth once a day 3)  Isentress 400 Mg Tabs (Raltegravir potassium) .... Take 1 tablet by mouth two times a day Prescriptions: ISENTRESS 400 MG TABS (RALTEGRAVIR POTASSIUM) Take 1 tablet by mouth two times a day  #60 x 3   Entered and Authorized by:   Johny Sax MD   Signed by:   Johny Sax MD on 10/21/2009   Method used:   Electronically to        [facility]* (retail)       [address]       [place], [place]  [address]       Ph: [phone] or [phone]       Fax: [phone]   RxID:   [id number] TRUVADA 200-300 MG TABS (EMTRICITABINE-TENOFOVIR) Take 1 tablet by mouth once a day  #90 x 3   Entered and Authorized by:   Johny Sax MD   Signed by:   Johny Sax MD on 10/21/2009   Method used:   Electronically to        [facility]* (retail)       [address]        [place], [place]  [address]       Ph: [phone] or [phone]       Fax: [phone]   RxID:   [id number]

## 2010-08-03 NOTE — Assessment & Plan Note (Signed)
Summary: EST-CK/FU/MEDS/CFB   CC:  follow-up visit.  History of Present Illness: 50 yo M with PMH significant for neuropathy, HIV+, DM that is diet controlled, Marfan's syndrome (last echo 11-08), and COPD.   CD4 360, VL 3060 (Sept-2010).  trying different diets for constipation. tried raw spinach, pinapple and apple juice drink last night which gave him best bm inseveral years! feels like he has built up a tolerance to his pain meds. has also recently had his klonipin decreased and has had worsening insomnia (has psy f/u at [facility]).   Preventive Screening-Counseling & Management  Alcohol-Tobacco     Alcohol drinks/day: 0     Smoking Status: current     Smoking Cessation Counseling: yes     Packs/Day: 1.0     Year Started: started at the age of 47     Passive Smoke Exposure: no  Caffeine-Diet-Exercise     Caffeine use/day: sodas everyday     Does Patient Exercise: no     Type of exercise: walking     Times/week: 8+  Safety-Violence-Falls     Seat Belt Use: yes  Current Medications (verified): 1)  Vitamin E 2)  Vitamin C 3)  Flaxseed 4)  Oxycontin 40 Mg Tb12 (Oxycodone Hcl) .... By Mouth Q 12h 5)  Propranolol Hcl 20 Mg Tabs (Propranolol Hcl) .... By Mouth Two Times A Day 6)  Nortriptyline Hcl  Caps (Nortriptyline Hcl Caps) .Marland Kitchen.. 125 Mg By Mouth At Bedtime 7)  Klonopin 2 Mg Tabs (Clonazepam) .... Take 1 Tablet By Mouth At Bedtime 8)  Spironolactone 25 Mg Tabs (Spironolactone) .... Take One Tablet By Mouth Once A Day 9)  Xanax 0.5 Mg Tabs (Alprazolam) .... Take One Tablet By Mouth Every  8 Hours As Needed 10)  Furosemide 40 Mg  Tabs (Furosemide) .... Take 1 Tablet By Mouth Once A Day 11)  Omeprazole 40 Mg Cpdr (Omeprazole) .... Take 1 Capsule By Mouth Once A Day  Allergies (verified): 1)  ! Tetracycline Hcl (Tetracycline Hcl)    Updated Prior Medication List: * VITAMIN E  * VITAMIN C  * FLAXSEED  OXYCONTIN 40 MG TB12 (OXYCODONE HCL) by mouth q 12h PROPRANOLOL HCL 20 MG  TABS (PROPRANOLOL HCL) by mouth two times a day NORTRIPTYLINE HCL  CAPS (NORTRIPTYLINE HCL CAPS) 125 mg by mouth at bedtime KLONOPIN 2 MG TABS (CLONAZEPAM) Take 1 tablet by mouth at bedtime SPIRONOLACTONE 25 MG TABS (SPIRONOLACTONE) Take one tablet by mouth once a day XANAX 0.5 MG TABS (ALPRAZOLAM) Take one tablet by mouth every  8 hours as needed FUROSEMIDE 40 MG  TABS (FUROSEMIDE) Take 1 tablet by mouth once a day OMEPRAZOLE 40 MG CPDR (OMEPRAZOLE) Take 1 capsule by mouth once a day  Current Allergies (reviewed today): ! TETRACYCLINE HCL (TETRACYCLINE HCL) Review of Systems       has quit taking his ART to "give his liver and kidneys a break", recently removed a growth on his big toe (topical clinda), cut his own toe nails, cont rash on his face, wants to see derm.  Vital Signs:  Patient profile:   50 year old male Height:      76 inches (193.04 cm) Weight:      241.6 pounds (109.82 kg) BMI:     29.51 Temp:     97.2 degrees F (36.22 degrees C) oral Pulse rate:   96 / minute BP sitting:   143 / 93  (left arm)  Vitals Entered By: Baxter Hire) (August 11, 2009 2:24 PM) CC: follow-up visit Is Patient Diabetic? Yes Did you bring your meter with you today? No Pain Assessment Patient in pain? yes     Location: lower back Intensity: 4 Type: dull Onset of pain  Constant Nutritional Status BMI of 25 - 29 = overweight Nutritional Status Detail patient checks blood sugar and it runs between 100-140 per patient. Appetite is good per patient.  Have you ever been in a relationship where you felt threatened, hurt or afraid?No   Does patient need assistance? Functional Status Self care Ambulation Normal           Prevention For Positives: 08/11/2009   Safe sex practices discussed with patient. Condoms offered.                             Physical Exam  General:  well-developed, well-nourished, well-hydrated, and underweight appearing.   Eyes:  pupils equal,  pupils round, and pupils reactive to light.   Mouth:  pharynx pink and moist and no exudates.   Neck:  no masses.   Lungs:  normal respiratory effort and normal breath sounds.   Heart:  normal rate, regular rhythm, and grade  4/6 HSM.   Abdomen:  soft, non-tender, and normal bowel sounds.   Extremities:  2-3+ edema, mod-severe venous insufficiency   Impression & Recommendations:  Problem # 1:  HIV INFECTION (ICD-042) he is off meds. will check his CD4 and VL, genotype as well as yearly labs. offered condoms. will have him back in 1-2 months.  Orders: Est. Patient Level IV [phone]) T-CD4SP [facility]) (CD4SP) T-HIV Viral Load [phone]) T-Comprehensive Metabolic Panel [phone]) T-Lipid Profile [phone]) T-CBC w/Diff [phone]) T-RPR (Syphilis) [phone]) T-HIV Genotype ([phone]) 2 D Echo (2 D Echo)  Problem # 2:  MARFAN'S SYNDROME (ICD-759.82)  will check repeat TTE.   Orders: 2 D Echo (2 D Echo)  Problem # 3:  DEPRESSION (ICD-311) he will f/u with Psy at [facility]. he is having a personality conflict with his provider there.  His updated medication list for this problem includes:    Nortriptyline Hcl Caps (Nortriptyline hcl caps) .Marland Kitchen... 125 mg by mouth at bedtime    Klonopin 2 Mg Tabs (Clonazepam) .Marland Kitchen... Take 1 tablet by mouth at bedtime    Xanax 0.5 Mg Tabs (Alprazolam) .Marland Kitchen... Take one tablet by mouth every  8 hours as needed  Problem # 4:  VENOUS INSUFFICIENCY, LEGS (ICD-459.81) advised pt to use compression stockings.

## 2010-08-03 NOTE — Assessment & Plan Note (Signed)
Summary: 81month f/u [mkj]   CC:  2 month follow up.  History of Present Illness: 50 yo M with PMH significant for neuropathy, HIV+, DM that is diet controlled, suspected  Marfan's syndrome, and COPD.   CD4 130, VL 29,600 (01-26-2010).  was written new ART at last visit (TFV/ISN). today feels like his modd is good, has not been gorging on food as much. Recently had URI but is improving. Has been having trouble with internal hemmeroids.  Cont to smoke. Has gotten new psychiatrist and he is pleased.   Preventive Screening-Counseling & Management  Alcohol-Tobacco     Alcohol drinks/day: 0     Smoking Status: current     Smoking Cessation Counseling: yes     Packs/Day: 1.0     Year Started: started at the age of 37     Passive Smoke Exposure: no  Caffeine-Diet-Exercise     Caffeine use/day: coffee, soda and tea     Does Patient Exercise: no     Type of exercise: walking  Safety-Violence-Falls     Seat Belt Use: yes   Updated Prior Medication List: * VITAMIN E  * VITAMIN C  * FLAXSEED  OXYCONTIN 40 MG TB12 (OXYCODONE HCL) by mouth q 12h PROPRANOLOL HCL 20 MG TABS (PROPRANOLOL HCL) by mouth two times a day KLONOPIN 2 MG TABS (CLONAZEPAM) Take 1 tablet by mouth at bedtime SPIRONOLACTONE 25 MG TABS (SPIRONOLACTONE) Take one tablet by mouth once a day XANAX 0.5 MG TABS (ALPRAZOLAM) Take one tablet by mouth every  8 hours as needed FUROSEMIDE 40 MG  TABS (FUROSEMIDE) Take 1 tablet by mouth once a day OMEPRAZOLE 40 MG CPDR (OMEPRAZOLE) Take 1 capsule by mouth once a day PROMETHAZINE HCL 25 MG TABS (PROMETHAZINE HCL) Take 1 tablet by mouth three times a day as needed nausea TRUVADA 200-300 MG TABS (EMTRICITABINE-TENOFOVIR) Take 1 tablet by mouth once a day ISENTRESS 400 MG TABS (RALTEGRAVIR POTASSIUM) Take 1 tablet by mouth two times a day TUSSIONEX PENNKINETIC ER 10-8 MG/5ML LQCR (HYDROCOD POLST-CHLORPHEN POLST) 1 teaspoon every 12 hours  Current Allergies (reviewed today): !  TETRACYCLINE HCL (TETRACYCLINE HCL) Past History:  Past Medical History: HIV+     Genotype 08-11-09 (L10V, Y188l) Marfan's     TTE (08-2009): Normal LV size with mild LV hypertrophy. EF 55-60%. There was mild       mitral regurgitation without significant mitral valve prolapse.       The aortic root was mildly dilated. The RV appeared normal. Given       known Marfan Syndrome, would consider MRA of full thoracic aorta       if this has not already been done. Diet controlled DM2 COPD Depression Hepatitis B, hx of Mitral Stenosis (area .12) 1-10  Review of Systems       wt down 5#, has started walking.   Vital Signs:  Patient profile:   50 year old male Height:      76 inches (193.04 cm) Weight:      238.8 pounds (108.55 kg) BMI:     29.17 Temp:     98.5 degrees F (36.94 degrees C) oral Pulse rate:   89 / minute BP sitting:   113 / 73  (left arm)  Vitals Entered By: Baxter Hire) (April 28, 2010 10:12 AM) CC: 2 month follow up Pain Assessment Patient in pain? yes     Location: feet and legs Intensity: 1 Type: aching Onset of pain  Constant Nutritional Status  BMI of 25 - 29 = overweight Nutritional Status Detail appetite is okay per patient  Have you ever been in a relationship where you felt threatened, hurt or afraid?Unable to ask-sister in the room   Does patient need assistance? Functional Status Self care Ambulation Normal   Physical Exam  General:  well-developed, well-nourished, well-hydrated, and overweight-appearing.   Eyes:  pupils equal, pupils round, and pupils reactive to light.   Mouth:  pharynx pink and moist and no exudates.   Neck:  no masses.   Lungs:  normal respiratory effort.  mild lower lung field crackles.  Heart:  normal rate, regular rhythm, and no murmur.   Abdomen:  soft, non-tender, and normal bowel sounds.   Rectal:  no external abnormalities and no hemorrhoids.             Prevention For Positives: 04/28/2010   Safe sex  practices discussed with patient. Condoms offered.                             Impression & Recommendations:  Problem # 1:  HIV INFECTION (ICD-042)  encouraged to start ART. not sexually active. get flushot today. will give pt rx for hemmeroids/hydrocortisone.  return to clinic 2-3 months.  Orders: Est. Patient Level IV ([address]) T-CD4SP ([facility]) (CD4SP) T-HIV Viral Load [phone]) T-Comprehensive Metabolic Panel [phone]) T-CBC w/Diff ([id number])  Problem # 2:  TOBACCO ABUSE (ICD-305.1) once again, counseled to quit....  Problem # 3:  COPD (ICD-496)  will check his CXr, he is concerned he has "fluid".   Orders: CXR- 2view (CXR)  Medications Added to Medication List This Visit: 1)  Hydrocortisone 1 % Crea (Hydrocortisone) .... Apply to rectum two times a day  Prescriptions: HYDROCORTISONE 1 % CREA (HYDROCORTISONE) apply to rectum two times a day  #2g x 0   Entered and Authorized by:   Johny Sax MD   Signed by:   Johny Sax MD on 04/28/2010   Method used:   Handwritten   RxID:   [id number] OXYCONTIN 40 MG TB12 (OXYCODONE HCL) by mouth q 12h  #60 x 0   Entered and Authorized by:   Johny Sax MD   Signed by:   Johny Sax MD on 04/28/2010   Method used:   Handwritten   RxID:   [id number]   Appended Document: Orders Update-flu shot    Clinical Lists Changes  Orders: Added new Service order of Influenza Vaccine MCR [phone]) - Signed Observations: Added new observation of FLU VAX#1VIS: 01/26/10 version given April 29, 2010. (04/29/2010 8:36) Added new observation of FLU VAXLOT: 11033P (04/29/2010 8:36) Added new observation of FLU VAX EXP: 10/03/2010 (04/29/2010 8:36) Added new observation of FLU VAXBY: Kathi Simpers [facility]) (04/29/2010 8:36) Added new observation of FLU VAXRTE: IM (04/29/2010 8:36) Added new observation of FLU VAX DSE: 0.5 ml (04/29/2010 8:36) Added new observation of FLU VAXMFR: [employer] (04/29/2010  8:36) Added new observation of FLU VAX SITE: left deltoid (04/29/2010 8:36) Added new observation of FLU VAX: Fluvax MCR (04/29/2010 8:36)       Influenza Vaccine    Vaccine Type: Fluvax MCR    Site: left deltoid    Mfr: [employer]    Dose: 0.5 ml    Route: IM    Given by: Kathi Simpers CMA(AAMA)    Exp. Date: 10/03/2010    Lot #: [id number]    VIS given: 01/26/10 version given April 29, 2010.  Flu Vaccine Consent Questions  Do you have a history of severe allergic reactions to this vaccine? no    Any prior history of allergic reactions to egg and/or gelatin? no    Do you have a sensitivity to the preservative Thimersol? no    Do you have a past history of Guillan-Barre Syndrome? no    Do you currently have an acute febrile illness? no    Have you ever had a severe reaction to latex? no    Vaccine information given and explained to patient? yes

## 2010-08-03 NOTE — Miscellaneous (Signed)
Summary: RW Update  Clinical Lists Changes  Observations: Added new observation of HIV STATUS: CDC-defined AIDS (08/05/2009 16:38)

## 2010-08-03 NOTE — Progress Notes (Signed)
Summary: RX  Phone Note Call from Patient   Caller: Patient Reason for Call: Refill Medication Summary of Call: Pt. called requesting to pick up pain medication RX on Friday 01/08/10 Initial call taken by: Wendall Mola CMA Duncan Dull),  January 06, 2010 3:43 PM
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
## 2010-08-03 NOTE — Assessment & Plan Note (Signed)
Summary: 6WK F/U/VS   CC:  6 week follow up visit.  History of Present Illness: 50 yo M with PMH significant for neuropathy, HIV+, DM that is diet controlled, Marfan's syndrome, and COPD.   CD4 150, VL 31,900 (08-11-2009).  TTE (08-2009): Normal LV size with mild LV hypertrophy. EF 55-60%. There was mild       mitral regurgitation without significant mitral valve prolapse.       The aortic root was mildly dilated. The RV appeared normal. Given       known Marfan Syndrome, would consider MRA of full thoracic aorta       if this has not already been done.  was written new ART at last visit (TFV/ISN). He did not start this as he was scared.  today states that he is doing well physically but mentally is having difficulties with [facility] psychiatry. FElt that they have been disrespectful of him acussing him of being an alcoholic (he does not drink) and that his brother gave him HIV (not true).   Preventive Screening-Counseling & Management  Alcohol-Tobacco     Alcohol drinks/day: 0     Smoking Status: current     Smoking Cessation Counseling: yes     Packs/Day: 1.0     Year Started: started at the age of 77     Passive Smoke Exposure: no  Caffeine-Diet-Exercise     Caffeine use/day: coffee, soda and tea     Does Patient Exercise: no     Type of exercise: walking     Times/week: 8+  Safety-Violence-Falls     Seat Belt Use: yes   Updated Prior Medication List: * VITAMIN E  * VITAMIN C  * FLAXSEED  OXYCONTIN 40 MG TB12 (OXYCODONE HCL) by mouth q 12h PROPRANOLOL HCL 20 MG TABS (PROPRANOLOL HCL) by mouth two times a day KLONOPIN 2 MG TABS (CLONAZEPAM) Take 1 tablet by mouth at bedtime SPIRONOLACTONE 25 MG TABS (SPIRONOLACTONE) Take one tablet by mouth once a day XANAX 0.5 MG TABS (ALPRAZOLAM) Take one tablet by mouth every  8 hours as needed FUROSEMIDE 40 MG  TABS (FUROSEMIDE) Take 1 tablet by mouth once a day OMEPRAZOLE 40 MG CPDR (OMEPRAZOLE) Take 1 capsule by mouth once a  day PROMETHAZINE HCL 25 MG TABS (PROMETHAZINE HCL) Take 1 tablet by mouth three times a day as needed nausea TRUVADA 200-300 MG TABS (EMTRICITABINE-TENOFOVIR) Take 1 tablet by mouth once a day ISENTRESS 400 MG TABS (RALTEGRAVIR POTASSIUM) Take 1 tablet by mouth two times a day  Current Allergies (reviewed today): ! TETRACYCLINE HCL (TETRACYCLINE HCL) Past History:  Past medical, surgical, family and social histories (including risk factors) reviewed, and no changes noted (except as noted below).  Past Medical History: Reviewed history from 10/09/2009 and no changes required. HIV+     Genotype 08-11-09 (L10V, Y188l) Marfan's Diet controlled DM2 COPD Depression Hepatitis B, hx of Mitral Stenosis (area .12) 1-10  Family History: Reviewed history from 10/09/2009 and no changes required. sister is local, involved.  Social History: Reviewed history from 11/25/2009 and no changes required. Current Smoker 0.5-1ppd Alcohol use-no  Review of Systems       states LE edema improves when he quits drinking [employer]   Vital Signs:  Patient profile:   50 year old male Height:      76 inches (193.04 cm) Weight:      243.0 pounds (110.45 kg) BMI:     29.69 Temp:     98.1 degrees F (36.72  degrees C) oral Pulse rate:   98 / minute BP sitting:   118 / 80  (left arm)  Vitals Entered By: Baxter Hire) (January 25, 2010 3:40 PM) CC: 6 week follow up visit Pain Assessment Patient in pain? no      Nutritional Status BMI of 25 - 29 = overweight Nutritional Status Detail appetite is good per patient  Have you ever been in a relationship where you felt threatened, hurt or afraid?No   Does patient need assistance? Functional Status Self care Ambulation Normal Comments Patient says pain is tolerable because of taking pain medication.   Physical Exam  General:  well-developed, well-nourished, well-hydrated, and overweight-appearing.   Eyes:  pupils equal, pupils round, and pupils  reactive to light.   Mouth:  pharynx pink and moist and poor dentition.   Neck:  no masses.   Lungs:  normal respiratory effort and normal breath sounds.   Heart:  normal rate, regular rhythm, and no murmur.   Abdomen:  soft, non-tender, and normal bowel sounds.   Extremities:  left pretibial edema and right pretibial edema.          Medication Adherence: 01/25/2010   Adherence to medications reviewed with patient. Counseling to provide adequate adherence provided                                Impression & Recommendations:  Problem # 1:  HIV INFECTION (ICD-042) pt will start MVI with iron. He is encouraged by me and his sister to start his ART. will check his CD4 and VL today at thier request. return to clinic 6-8 weeks.    Problem # 2:  DEPRESSION (ICD-311) will refer him to new mental health worker.  His updated medication list for this problem includes:    Klonopin 2 Mg Tabs (Clonazepam) .Marland Kitchen... Take 1 tablet by mouth at bedtime    Xanax 0.5 Mg Tabs (Alprazolam) .Marland Kitchen... Take one tablet by mouth every  8 hours as needed  Problem # 3:  TOBACCO ABUSE (ICD-305.1) encouraged to quit smoking.   Allergies: 1)  ! Tetracycline Hcl (Tetracycline Hcl)  Appended Document: Orders Update    Clinical Lists Changes  Orders: Added new Referral order of Psychiatric Referral (Psych) - Signed Added new Service order of Est. Patient Level IV (16109) - Signed Added new Test order of T-CD4SP [facility]) (CD4SP) - Signed Added new Test order of T-HIV Viral Load [phone]) - Signed

## 2010-08-18 ENCOUNTER — Encounter: Payer: Self-pay | Admitting: Infectious Diseases

## 2010-08-18 ENCOUNTER — Ambulatory Visit (INDEPENDENT_AMBULATORY_CARE_PROVIDER_SITE_OTHER): Payer: PRIVATE HEALTH INSURANCE | Admitting: Infectious Diseases

## 2010-08-18 DIAGNOSIS — Z23 Encounter for immunization: Secondary | ICD-10-CM

## 2010-08-18 DIAGNOSIS — B20 Human immunodeficiency virus [HIV] disease: Secondary | ICD-10-CM
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
## 2010-09-02 ENCOUNTER — Telehealth (INDEPENDENT_AMBULATORY_CARE_PROVIDER_SITE_OTHER): Payer: Self-pay | Admitting: *Deleted

## 2010-09-09 NOTE — Progress Notes (Addendum)
Summary: pain rx printed for signature  Phone Note Call from Patient   Caller: Patient Summary of Call: wants to make sure his pain med rx will be ready for pick up monday or tuesday Initial call taken by: Golden Circle RN,  September 02, 2010 4:50 PM    Prescriptions: OXYCONTIN 40 MG TB12 (OXYCODONE HCL) by mouth q 12h  #60 x 0   Entered by:   Jennet Maduro RN   Authorized by:   Johny Sax MD   Signed by:   Jennet Maduro RN on 09/02/2010   Method used:   Print then Give to Patient   RxID:   [id number]   Appended Document: pain rx printed for signature notified pt that rx was ready

## 2010-09-15 LAB — T-HELPER CELL (CD4) - (RCID CLINIC ONLY): CD4 T Cell Abs: 120 uL — ABNORMAL LOW (ref 400–2700)

## 2010-10-06 ENCOUNTER — Other Ambulatory Visit: Payer: Self-pay | Admitting: Licensed Clinical Social Worker

## 2010-10-06 DIAGNOSIS — G629 Polyneuropathy, unspecified: Secondary | ICD-10-CM

## 2010-10-06 MED ORDER — OXYCODONE HCL 40 MG PO TB12: 40.0000 mg | ORAL_TABLET | Freq: Two times a day (BID) | ORAL | Status: DC

## 2010-10-08 LAB — T-HELPER CELL (CD4) - (RCID CLINIC ONLY)
CD4 % Helper T Cell: 17 % — ABNORMAL LOW (ref 33–55)
CD4 T Cell Abs: 360 uL — ABNORMAL LOW (ref 400–2700)

## 2010-10-18 ENCOUNTER — Ambulatory Visit (INDEPENDENT_AMBULATORY_CARE_PROVIDER_SITE_OTHER): Payer: PRIVATE HEALTH INSURANCE

## 2010-10-18 DIAGNOSIS — Z79899 Other long term (current) drug therapy: Secondary | ICD-10-CM

## 2010-10-18 DIAGNOSIS — B20 Human immunodeficiency virus [HIV] disease: Secondary | ICD-10-CM

## 2010-10-18 DIAGNOSIS — Z113 Encounter for screening for infections with a predominantly sexual mode of transmission: Secondary | ICD-10-CM

## 2010-10-19 LAB — CBC WITH DIFFERENTIAL/PLATELET
Basophils Absolute: 0 10*3/uL (ref 0.0–0.1)
Eosinophils Absolute: 0.1 10*3/uL (ref 0.0–0.7)
Eosinophils Relative: 4 % (ref 0–5)
Lymphocytes Relative: 25 % (ref 12–46)
MCV: 83.2 fL (ref 78.0–100.0)
Platelets: 173 10*3/uL (ref 150–400)
RDW: 16.1 % — ABNORMAL HIGH (ref 11.5–15.5)
WBC: 3.5 10*3/uL — ABNORMAL LOW (ref 4.0–10.5)

## 2010-10-19 LAB — COMPLETE METABOLIC PANEL WITH GFR
Albumin: 3.9 g/dL (ref 3.5–5.2)
BUN: 8 mg/dL (ref 6–23)
CO2: 24 mEq/L (ref 19–32)
Calcium: 9.3 mg/dL (ref 8.4–10.5)
Chloride: 100 mEq/L (ref 96–112)
Creat: 0.8 mg/dL (ref 0.40–1.50)
GFR, Est African American: >60 mL/min (ref >60)
Potassium: 3.8 mEq/L (ref 3.5–5.3)

## 2010-10-19 LAB — LIPID PANEL
Cholesterol: 136 mg/dL (ref 0–200)
HDL: 21 mg/dL — ABNORMAL LOW (ref >39)
LDL Cholesterol: 89 mg/dL (ref 0–99)
Total CHOL/HDL Ratio: 6.5 Ratio
Triglycerides: 129 mg/dL (ref <150)
VLDL: 26 mg/dL (ref 0–40)

## 2010-10-19 LAB — T-HELPER CELL (CD4) - (RCID CLINIC ONLY): CD4 % Helper T Cell: 12 % — ABNORMAL LOW (ref 33–55)

## 2010-11-02 ENCOUNTER — Ambulatory Visit: Payer: PRIVATE HEALTH INSURANCE | Admitting: Infectious Diseases

## 2010-11-05 ENCOUNTER — Encounter: Payer: Self-pay | Admitting: Infectious Diseases

## 2010-11-05 ENCOUNTER — Ambulatory Visit (INDEPENDENT_AMBULATORY_CARE_PROVIDER_SITE_OTHER): Payer: PRIVATE HEALTH INSURANCE | Admitting: Infectious Diseases

## 2010-11-05 ENCOUNTER — Other Ambulatory Visit: Payer: Self-pay | Admitting: *Deleted

## 2010-11-05 DIAGNOSIS — F17200 Nicotine dependence, unspecified, uncomplicated: Secondary | ICD-10-CM

## 2010-11-05 DIAGNOSIS — G629 Polyneuropathy, unspecified: Secondary | ICD-10-CM

## 2010-11-05 DIAGNOSIS — B20 Human immunodeficiency virus [HIV] disease: Secondary | ICD-10-CM

## 2010-11-05 DIAGNOSIS — F329 Major depressive disorder, single episode, unspecified: Secondary | ICD-10-CM

## 2010-11-05 MED ORDER — OXYCODONE HCL 40 MG PO TB12: 40.0000 mg | ORAL_TABLET | Freq: Two times a day (BID) | ORAL | Status: DC

## 2010-11-05 NOTE — Progress Notes (Signed)
  Subjective:    Patient ID: [name], male    DOB: Oct 05, 1960, 50 y.o.   MRN: [id number]  HPI 50 yo M with neuropathy, HIV/AIDS, DM that is diet controlled, suspected  Marfan's syndrome, and COPD.  CD4 110, VL 56k (10-18-2010). Cont to smoke occasionally.  He was written new ART in 2011 (TRV/ISN) and has not yet started. He is here with his sister, and he has concerns about his mouth dropping open due to his lack of dentures. He worries about people staring at him.   Review of Systems     Objective:   Physical Exam  Constitutional: He appears well-developed and well-nourished.  Eyes: EOM are normal. Pupils are equal, round, and reactive to light.  Neck: Neck supple.  Cardiovascular: Normal rate, regular rhythm and normal heart sounds.   Pulmonary/Chest: Effort normal and breath sounds normal.  Abdominal: Soft. Bowel sounds are normal. He exhibits no distension.          Assessment & Plan:

## 2010-11-05 NOTE — Assessment & Plan Note (Signed)
Continued to encourage him to quit.Marland KitchenMarland KitchenMarland Kitchen

## 2010-11-05 NOTE — Assessment & Plan Note (Signed)
He has concerns about recurrent negative thoughts. I gave him some strategies about reframing these.

## 2010-11-05 NOTE — Assessment & Plan Note (Signed)
i have again encouraged him to start his ART. He is worried about this changing his skin. Will see him back in 3-4 months.

## 2010-11-16 NOTE — Consult Note (Signed)
[name], [name]                   ACCOUNT NO.:  [id number]   MEDICAL RECORD NO.:  [id number]          PATIENT TYPE:  REC   LOCATION:  FOOT                         FACILITY:  [facility]   PHYSICIAN:  Theresia Majors. Tanda Rockers, M.D.DATE OF BIRTH:  1961-05-14   DATE OF CONSULTATION:  11/28/2006  DATE OF DISCHARGE:                                 CONSULTATION   SUBJECTIVE:  Mr. Wigfall is a 50 year old man referred by Dr. Eligah East for  evaluation of an ulceration of his left foot.   IMPRESSION:  Resolved ulceration, left foot.   RECOMMENDATIONS:  The patient is reassured and encouraged to keep his  follow-up with Dr. Maple Hudson and Dr. Ninetta Lights.   SUBJECTIVE:  Mr. Schara is a 50 year old man who was seen 1 week ago by  Dr. Maple Hudson and was noted to have an ulceration on his left foot.  The  wound had been present for a month and was related to trauma.   PAST MEDICAL HISTORY:  Remarkable for a history of congestive failure  and a heart murmur.  The patient is concurrently being managed by Dr. Ninetta Lights for positive  HIV.   CURRENT MEDICATIONS:  1. Nortriptyline 25 mg h.s.  2. Clonidine 2 mg h.s.  3. Xanax 0.5 mg q.8h.  4. Genalac 10 mg per 15mL 4-6 tablespoons two to three times a day.  5. Atripla tablets h.s.  6. Spirolactone 25 mg a day.  7. Protonix 40 mg a day.  8. Propranolol 20 mg b.i.d.  9. Lasix 40 mg q.a.m.  10.OxyContin 40 mg q.12h.   The patient is allergic to TETRACYCLINE, which gives him hives and  swelling.   His past surgeries included a resection of a right cholesteatoma and a  ruptured colon repair 5 years ago/.   FAMILY HISTORY:  Positive for vascular disease and hypercoagulable  state.   SOCIAL HISTORY:  He is disabled due to his HIV medications and  management.   He is also having significant exercise intolerance due to presumed  valvular heart disease.  He denies syncope.  His exercise tolerance has  actually improved over the last 6 months.  He has trouble with  occasional  constipation but of recent has improved due to medication.  There are no GU symptoms.  He denies syncope, seizure activity,  transient visual loss or paralysis.  The remainder of the review of  systems is negative.   PHYSICAL EXAMINATION:  GENERAL:  He is an alert, oriented man in no  acute distress.  He is in good contact with reality, responding  appropriately to all questioning.  HEENT:  Clear.  NECK:  Supple.  Trachea is midline.  Thyroid is nonpalpable.  LUNGS:  Clear.  CARDIAC:  The heart sounds are abnormal with a systolic murmur at the  left sternal border.  ABDOMEN:  There is trace ascites.  EXTREMITIES:  Femoral pulses are 3+.  The dorsalis pedis pulse is 3+  bilaterally.  There is +3 pitting edema bilaterally.  The patient is  insensate to [employer] in both feet.   DISCUSSION:  This  patient was initially referred for evaluation of an  ulceration.  This has completely resolved.  We have reassured the  patient that our exam fails to disclose any concurrent ulcerations in  spite of a thorough exam of his foot.  We have encouraged him to  continue his follow-up with Dr. Ninetta Lights and Dr. Maple Hudson.  We will see him  on a p.r.n. basis.      Harold A. Tanda Rockers, M.D.  Electronically Signed     HAN/MEDQ  D:  11/28/2006  T:  11/28/2006  Job:  [id number]

## 2010-11-18 ENCOUNTER — Other Ambulatory Visit: Payer: Self-pay | Admitting: Infectious Diseases

## 2010-11-19 NOTE — Discharge Summary (Signed)
[place]  Patient:    [name], [name]                          MRN: [id number] Adm. Date:  [id number] Disc. Date: [id number] Attending:  Heber Carrollton Dictator:   Cornell Barman, P.A. CC:         Oliver Barre, M.D., Medicine                           Discharge Summary  DISCHARGE DIAGNOSES: 1. Abdominal pain. 2. Constipation.  BRIEF ADMISSION HISTORY:  Mr. Tapanes is a 50 year old white male who developed epigastric pain around 9 a.m. on the morning of admission.  Pain radiated to his lower abdomen and back to epigastric region.   The pain was constant cramping. He did have some associated shortness of breath.  He did have nausea around 10:30 a.m., but no emesis.  He did have a bowel movement on the morning of admission, but it was a small bowel movement.  Patient does occasionally have constipation.  PAST MEDICAL HISTORY: 1. HIV positive since 1996. 2. Marfan syndrome. 3. Mitral valve prolapse. 4. History of perforated colon, status post surgical repair. 5. Status post appendectomy. 6. Gastroesophageal reflux disease. 7. Hypertension. 8. Depression. 9. Removal of his right eardrum. 10. Status post resection of a benign growth to his right skull.  LABS ON ADMISSION: Unremarkable.  Abdomen films showed increased stool.  HOSPITAL COURSE:  Abdominal pain consistent with constipation secondary to increased narcotics.  No evidence of obstruction.  The patient was given Fleet enema and simethicone with good results.  The patient was felt to be stable for  discharge home the next day, and patient was discharged on his previous home medications, including Zygan, Combivir, Prilosec, Nortriptyline, OxyContin, Clonopin, Xanax, and Phenergan p.r.n. DD:  10/22/99 TD:  10/23/99 Job: [id number]

## 2010-11-19 NOTE — Discharge Summary (Signed)
[name], [name]                   ACCOUNT NO.:  [id number]   MEDICAL RECORD NO.:  [id number]          PATIENT TYPE:  INP   LOCATION:  5740                         FACILITY:  [facility]   PHYSICIAN:  Judie Grieve, MD  DATE OF BIRTH:  [birth year]   DATE OF ADMISSION:  03/31/2005  DATE OF DISCHARGE:  04/04/2005                                 DISCHARGE SUMMARY   PRIMARY CARE PHYSICIAN:  OBC.   DISCHARGE DIAGNOSES:  1.  Bilateral lower extremity edema.  2.  Human immunodeficiency virus (HIV).  3.  Anemia.  4.  Depression/anxiety.  5.  Shingles.  6.  Hepatitis-B infection.  7.  Hemorrhoids.  8.  Status post surgical repair of perforated colon.  9.  Status post appendectomy.  10. Gastroesophageal reflux disease.  11. Mitral regurgitation.  12. History of lower extremity cellulitis.   DISCHARGE MEDICATIONS:  1.  Furosemide 40 mg a day.  2.  Azithromycin 1200 mg q. week.  3.  Bactrim 40 mg every Monday, Wednesday, Friday.  4.  Atenolol 50 mg a day.  5.  Ferrous sulfate 325 mg a day.  6.  Protonix 40 mg a day.  7.  Nortriptyline 50 mg a day.  8.  Clonazepam 2 mg q.h.s.   DISPOSITION:  [name] was discharged with improvement of his lower  extremity edema, which is believed to be due to noninfectious causes not  related to congestive heart failure but primarily related to chronic venous  insufficiency.  He was discharged with an increased dose of Lasix p.o. and  encouraged to use compression hose during the day.  He was also encouraged  to follow up with Dr. Ninetta Lights to restart HAART for HIV.   PROCEDURES:  1.  Transthoracic echocardiogram performed on September 29th:  Left      ventricular size was at the upper limits of normal, normal left      ventricular systolic dysfunction with an ejection fraction of 55 to 65%,      dyskinesis of the basal posterolateral wall, mild mitral valvular      regurgitation, mildly dilated left atrium, mitral valve prolapse no      longer  clearly present from prior study.  2.  Images:  Tibia-fibula x-ray, no evidence for bony infection.  3.  Abdominal ultrasound:  Hepatosplenomegaly; stable, diffusely, echogenic      liver compatible with fatty infiltration; contracted gallbladder with      progressive cholelithiasis; mild gallbladder wall thickening; stable,      chronically dilated, common duct.   HISTORY OF PRESENT ILLNESS:  [name] is a 50 year old man with HIV,  Marfan's disease, hepatitis-B, and COPD, who finished a six-weeks course of  Augmentin and Cipro for right leg cellulitis.  He presents complaining of  leg swelling bilaterally for years that recently has been worsening over the  last two weeks.  He has completed two six-week courses of Cipro and  Augmentin.  No fever and chills.  No nausea and vomiting.  Increasing  shortness of breath with exertion.  No chest pain.   VITAL  SIGNS ON ADMISSION:  Temp 98.7, pulse 93, respirations 20, blood  pressure 124/73, satting 96% on room air, 74 inches tall, 103 kg in weight.   PHYSICAL EXAMINATION:  NECK:  No JVD.  No carotid bruits.  SKIN:  No ulcerations.  HEART:  3/6 systolic ejection murmur heard best at the left lower sternal  border, radiation to axilla.  EXTREMITIES:  Slight erythema on right anterior leg.  Bilateral 2+ pitting  edema to knee.  Decreased sensation in lower extremity to six inches below  knee.   ADMISSION LABORATORY DATA:  White blood cells 3.9, hemoglobin 9.7,  hematocrit 29.3, MCV 73.9, platelets 203, PT 16.5, INR 1.3, PTT 38.  Sodium  137, potassium 3.4, chloride 105, CO2 25, glucose 103, BUN 3, creatinine  0.7.  Alkaline phosphatase 97, AST 33, ALT 18, total protein 7.8, albumin  2.9, calcium 8.5.  BNP less than 30.  Urine drug screen negative.  Blood  culture no growth.   HOSPITAL COURSE:  1.  Bilateral lower extremity edema:  The patient was initially started on      Vancomycin on admission in the event that there was a component of       cellulitis in his bilateral lower extremity edema.  It was determined      over the course of the hospitalization that infectious causes were      extremely unlikely, and antibiotics were discontinued.  The patient was      also evaluated for CHF exacerbation with an echocardiogram, which showed      good systolic function.  It was determined that congestive heart failure      was likely not playing a large role in his lower extremity edema and      that its worsening was due mainly to inadequate diuresis on Lasix 20 mg      a day.  He was treated with Lasix 40 mg IV throughout the course of his      hospitalization and discharged on Lasix 40 mg by mouth.  We will follow      him up in one week in clinic to evaluate for a continuing resolution of      his lower extremity edema and to follow his electrolytes.  2.  HIV:  The patient was started on antibiotic prophylaxis on admission.      Last CD-4 count was 80.  He was started on Bactrim every Monday,      Wednesday, and Friday, and on Azithromycin every week for PCP and MAI      prophylaxis respectively.  The patient has not taken any retroviral      therapy due to side effects.  Patient was encouraged to follow up with      Dr. Ninetta Lights and take his HAART for better control of his HIV infection.      Patient expressed understanding of the need to restart HAART and      expressed a willingness to do so.  3.  Anemia:  Iron panel in-house showed a total iron of 36, a total iron      binding capacity of 365, percent saturation 10, B12 and folate within      normal limits, ferritin 16.  This picture was most consistent with iron      deficiency anemia.  The patient said he has had frequent nosebleeds,      which he attributes to using irritating perfumes in his home.  He is  instructed how to properly apply pressure to stop these nosebleeds and     to avoid using irritating chemicals.  He was also given three stool      cards to check  for fecal occult blood.  We will follow these results in      clinic, and if necessary we will schedule the patient for a colonoscopy.      He does have a history of duodenal ulcers diagnosed by EGD, which healed      several years ago and have not been a problem for him since.  4.  Depression/anxiety:  His psychiatric meds were continued throughout the      hospitalization.  Symptoms were stable on discharge.  5.  Shingles:  These lesions appear to be crusted over.  He says they tend      to become red after he showers.  The patient was placed on __________      precautions starting on hospital day two of his hospitalization.  6.  Gastroesophageal reflux disease:  Protonix was continued throughout the      hospitalization.      Judie Grieve, MD     SY/MEDQ  D:  04/04/2005  T:  04/04/2005  Job:  [id number]   cc:   Lacretia Leigh. Ninetta Lights, M.D.  Fax: [phone]

## 2010-11-19 NOTE — Discharge Summary (Signed)
[name] NO.:  [id number]   MEDICAL RECORD NO.:  [id number]          PATIENT TYPE:  INP   LOCATION:  4742                         FACILITY:  [facility]   PHYSICIAN:  Fransisco Hertz, M.D.  DATE OF BIRTH:  14-Sep-1960   DATE OF ADMISSION:  05/09/2005  DATE OF DISCHARGE:  05/14/2005                                 DISCHARGE SUMMARY   CONSULTATIONS:  Rachael Fee, M.D., [facility], Department of  Gastroenterology.   CONTINUITY PHYSICIAN:  Lacretia Leigh. Ninetta Lights, M.D., [facility],  Infectious Disease.   DISCHARGE DIAGNOSES:  1.  Hepatomegaly suggestive of cirrhosis, likely secondary to hepatitis B      infection.  2.  Iron-deficiency anemia secondary to sessile colonic polyps, status post      polypectomy.  3.  Sessile colonic polyp, status post polypectomy, pending pathology      report.  4.  Chronic hepatitis B infection.  5.  Advanced human immunodeficiency virus disease. Viral load of 3680 and      CD4 of 130, dated April 20, 2005.  6.  Hypertensive gastropathy.  7.  Chronic bilateral lower extremity edema.  8.  Depression.   DISCHARGE MEDICATIONS:  1.  Spironolactone 25 mg p.o. once daily.  2.  Furosemide 10 mg one tablet p.o. once daily.  3.  Lactulose 20-30 mL three to four times daily to get two to three soft      bowel movements daily.  4.  Nortriptyline 125 mg p.o. once daily.  5.  Atripla one tablet p.o. q.h.s.  6.  Pantoprazole 40 mg p.o. q.a.m.  7.  Ferrous sulfate one tablet p.o. daily.  8.  Propranolol 20 mg p.o. b.i.d.  9.  Bactrim one double strength tablet daily on Monday, Wednesday, and      Friday.  10. Clonazepam 0.5 mg p.o. b.i.d. p.r.n. anxiety.  11. Preparation H cream.   The patient was also placed on a low salt diet.   DISPOSITION AND FOLLOW-UP:  At the time of discharge [name] is a 50 year  old HIV/HBV coinfected male who was admitted with hepatomegaly, complicated  by ascites, left at the time  of discharge in good condition. The patient was  asked to follow up with Dr. Ninetta Lights on the morning of November 15th at [facility], outpatient services, to follow up on hepatomegaly as well as  HIV therapy along with iron-deficiency anemia. The patient will see Dr.  Ninetta Lights for that follow-up at gastroenterology with Dr. Christella Hartigan on an as-  needed basis. He should receive a colonoscopy every five years for  surveillance of further polyps, one sessile polyp diagnosed on colonoscopy  during admission. At the time of discharge the tissue sample from that polyp  was still in pathology, pending histopathological diagnosis. Please kindly  follow up on that if [name] has not done that himself.   Consultations were as above with Dr. Christella Hartigan of gastroenterology, primary  consultant.   PROCEDURE PERFORMED:  1.  Abdominal CT scan done at the time of admission that  revealed a large      fatty liver with multiple echogenicity suggestive of cirrhosis. Also      showed mild ascites as well as portosystemic shunting.  2.  On November 9th the patient had an upper endoscopy and EGD that showed a      hypertensive gastropathy.  3.  On May 13, 2005, the day prior to discharge, colonoscopy was      performed which revealed a 3-cm sessile colonic polyp that was excised      and sent for pathologic diagnosis. The histopathologic report is still      pending at the time of discharge.  4.  A Doppler ultrasound was obtained of the lower extremities bilaterally      on May 10, 2005, that was done to rule out venous thrombosis. It      showed no signs of deep venous thrombosis, Baker's cyst, or superficial      thrombosis.   ADMITTING HISTORY AND PHYSICAL:  On May 09, 2005, [name] is a 50-year-  old HIV infected male with CD4 count of 130 and viral load of 3680 and past  medical history significant for hepatitis B infection as well as Marfan  syndrome, who was admitted from the infectious  disease clinic on May 09, 2005, where he presented with a two week history of increasing abdominal  girth, right upper quadrant pain, shortness of breath, and bilateral lower  extremity edema. The patient was discharged from [facility] on  April 04, 2005, with an unexplained etiology of lower extremity edema with  a normal echocardiogram that showed a normal left ventricular ejection  fraction. The patient had been on furosemide 40 mg daily after discharge at  the beginning of October, but had to stop it two weeks prior to the current  hospital admission secondary to hypotension, after which his bilateral lower  extremity edema worsened and he experienced increasing abdominal girth,  followed by shortness of breath. There was no cough, chest pain, orthopnea,  dysuria, vomiting, or fever at the time of admission. At the time of  admission, his physical exam was as follows: Vital signs showed temperature  of 98.4, blood pressure 149/86, pulse 100, respiratory rate 22, weight 240.8  pounds. Generally,  he appeared as moderately distressed and lethargic. His  eyes showed pupils were equal and reactive to light bilaterally with 5-mm in  diameter. There was no icterus. ENT showed mucous membranes. Respiratory  exam was clear to auscultation bilaterally. Cardiovascular exam revealed a  regular sinus rhythm, with normal S1 and S2, accompanied by a holosystolic  murmur 3/6 best heard at the apex. GI exam was significant for distended  abdomen, positive right upper quadrant tenderness. The liver was distended  to approximately 8 cm below the right costal margin. There was shifting  dullness ascertained. There was no guarding. Bowel sounds were hypoactive.  In the extremity exam, there was 4+ pitting edema to the knees with warm  erythematous skin overlying the area of edema. Neurologically, there was no asterixis. Cranial nerves were intact, II-XII. 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   Labs on admission, for point of reference, showed sodium of 133, potassium  3.7, chloride 04, bicarbonate 25, BUN 5, creatinine 0.7, and glucose 107,  bilirubin 0.7, alkaline phosphatase 91, AST 26, ALT 13. Protein was 8.0,  albumin 2.7, calcium 8.3. CBC  showed a white count of 4.1, ANC 3.1, and an  MCV of 74.1, hemoglobin 10.0, hematocrit 30.4, platelet count 258,000.   EKG showed left ventricular hypertrophy and left atrial enlargement. Chest x-  ray showed bibasilar atelectasis.   HOSPITAL COURSE:  Problem #1: Hepatomegaly complicated by ascites. It was  felt that this problem was probably secondary to chronic hepatitis B  infection. On abdominal CT scan at the time of admission the patient was  found to have hepatomegaly with fatty infiltration, multiple echogenicity in  the liver with increased portal vein portal systemic shunting. It was  unclear as this was only a radiographic evaluation whether the patient  actually had cirrhosis. The patient was managed with diuretic therapy  including spironolactone and furosemide which he responded well to,  decreasing 13 pounds at the time of admission as compared to the weight on  admission. The patient had decreased bilateral lower extremity edema. At the  time of discharge his respiratory air movement improved due to decreased  abdominal distention. The patient also had a hepatitis panel conducted which  showed that he was positive for hepatitis B surface antigen as well as  positive for anti-hepatitis B/E component and antibody. He was negative for  hepatitis C virus RNA. He was negative for anti-hepatitis C antibody. He was  negative for hepatitis D core antibody. He was negative for hepatitis B/E  antigen. At the time of discharge the patient was also placed on  emtricitabine, which was in combination of antiretroviral therapy and  the  drug, Atripla, which was given at one tablet daily during admission at the  of admission.  This was done as emtricitabine has been shown to have strong  anti-hepatitis B viral activity. The patient tolerated Atripla well and was  continued on that at the time of discharge. The patient was to follow up  with Dr. Ninetta Lights regarding his hepatitis as well as to follow up for further  hepatitis B management.   Regarding hepatomegaly ascites, gastroenterologist, Dr. Christella Hartigan, was called  to consult regarding Mr. Sherren Kerns hepatomegaly. Gastroenterology was decided to  do an upper endoscopy with an EGD on May 12, 2005, to rule out any  occult esophageal varices, especially in the context of iron-deficiency  anemia. Please see the results under procedures performed, but briefly that  showed hypertensive gastropathy. They also performed a colonoscopy at the same time just to give them the iron-deficiency anemia and the patient was  found to have a sessile colonic polyp.   Problem #2: Colonic sessile polyp. Please see procedures performed and  problem #1 for further details.   Problem #3: Iron-deficiency anemia. It is felt that these were likely  secondary to problems #2 and hypertension gastropathy. Please see under #1  for further details. Iron-deficiency anemia was managed with ferrous sulfate  tablet given daily and the patient was discharged with at the time of  discharge.   Problem #4: HIV infection. The patient had multiple issues and inability to  adhere to followup appointments secondary to depression and a difficult  social situation in which he lives in public housing and has drug abuse in  the neighborhood. The patient does not use drugs, but the patient was  troubled abusing neighbors. The patient also recently experienced  bereavement from the loss of his mother, but the patient was counseled on  these issues and he agreed to continue after he started the first time  September  on antiretroviral therapy. The patient complained about nausea  and  vomiting secondary to after being started Sustiva and Truvada in the middle  of September. So we converted him to Atripla during admission which he  tolerated well. It was a good prognosis for this patient it seems in terms  of his HIV disease as he seems to be responding to antiretroviral therapy  which he has been given for the first time during his time with HIV disease.  Please Dr. Moshe Cipro clinic records for further details. Briefly, it was  shown that after the patient started for the first time in September 2006 on  Truvada and Sustiva the patient's viral load declined several fold and the  patient's CD4 count rose from 80 to 130 within a matter of four to five  weeks it seems.   Problem #5: Depression. The patient was continued on nortriptyline 125 mg  daily for his depression. The patient was also referred to mental health  services including a psychologist at [facility] where he gets  psychiatric care. In terms of his outlook on life and emotional affect, the  patient seemed to improve tremendously from time of admission to time of  discharge with multiple conversations from hospice, but the patient rejected  now the need for hospice as he has a more positive outlook on his diseases,  he feels assured by knowledge that there maybe a chance that any cirrhosis  damage to the liver may possibly be corrected as well as HIV disease be  further controlled with proper adherence to Atripla tablet.   Problem #6: Pain management. The patient has chronically been taking  narcotic pain medication for pain in the lower extremities secondary to  edema. The patient had been taking OxyContin 30 mg p.o. b.i.d. and had been  taking it for long-term for an outpatient basis. While narcotics are known to cause further damage to the liver and in this patient is particularly  important given his already extensive liver  failure. We decided at the time  of discharge to keep the patient on OxyContin and ask Dr. Ninetta Lights to  consider tapering the narcotics and converting to an alternative form of  pain management over a longer period.   At the time of discharge, laboratory values were as follows: The patient had  a basic metabolic panel that showed a sodium of 134, potassium 3.5, chloride  103, bicarbonate 24, BUN  8, creatinine 1.0, glucose 113, calcium 8.7.      Coralie Carpen, M.D.    ______________________________  Fransisco Hertz, M.D.    FR/MEDQ  D:  05/17/2005  T:  05/18/2005  Job:  [id number]   cc:   Lacretia Leigh. Ninetta Lights, M.D.  Fax: [phone]   Rachael Fee, M.D.

## 2010-12-06 ENCOUNTER — Other Ambulatory Visit: Payer: Self-pay | Admitting: Licensed Clinical Social Worker

## 2010-12-06 DIAGNOSIS — G629 Polyneuropathy, unspecified: Secondary | ICD-10-CM

## 2010-12-06 MED ORDER — OXYCODONE HCL 40 MG PO TB12: 40.0000 mg | ORAL_TABLET | Freq: Two times a day (BID) | ORAL | Status: DC

## 2010-12-30 ENCOUNTER — Telehealth: Payer: Self-pay | Admitting: *Deleted

## 2010-12-30 NOTE — Telephone Encounter (Signed)
States he fell this am & left a large dent in wall where his head hit it. He was not sure if he lost consiousness. Has not yet eaten or drunk anything. He was confused & rambling during the conversation. States he did not know he had diabetes. He also questioned his antianxiety meds. States he is not sure he is taking them right. I asked him to go drink juice while I waited on the phone. Advised calling 911 NOW & bring all meds with him to hospital. He promised he would do this. I asked him, to call us later to tell us how he was. He agreed with plan

## 2010-12-30 NOTE — Telephone Encounter (Signed)
States he does not want the bill for using EMS. States his sister will bring him in about an hour. She is at the doctor's now. He wanted to take a bath. Advised him not to. Told him I am concerned about falls. Asked that he NOT bathe, while alone in home. He assured me he will go to ED, but states this has happened before & he thinks he mixed up his medicines & that is why he fell

## 2011-01-06 ENCOUNTER — Other Ambulatory Visit: Payer: Self-pay | Admitting: *Deleted

## 2011-01-06 DIAGNOSIS — G629 Polyneuropathy, unspecified: Secondary | ICD-10-CM

## 2011-01-06 MED ORDER — OXYCODONE HCL 40 MG PO TB12: 40.0000 mg | ORAL_TABLET | Freq: Two times a day (BID) | ORAL | Status: DC

## 2011-01-23 ENCOUNTER — Observation Stay (HOSPITAL_COMMUNITY)
Admission: EM | Admit: 2011-01-23 | Discharge: 2011-01-25 | Disposition: A | Discharge status: Home / Self Care | Payer: PRIVATE HEALTH INSURANCE | Attending: Internal Medicine | Admitting: Internal Medicine

## 2011-01-23 ENCOUNTER — Emergency Department (HOSPITAL_COMMUNITY): Payer: PRIVATE HEALTH INSURANCE

## 2011-01-23 ENCOUNTER — Encounter: Payer: Self-pay | Admitting: Ophthalmology

## 2011-01-23 DIAGNOSIS — E876 Hypokalemia: Secondary | ICD-10-CM | POA: Insufficient documentation

## 2011-01-23 DIAGNOSIS — G609 Hereditary and idiopathic neuropathy, unspecified: Secondary | ICD-10-CM | POA: Insufficient documentation

## 2011-01-23 DIAGNOSIS — F411 Generalized anxiety disorder: Secondary | ICD-10-CM | POA: Insufficient documentation

## 2011-01-23 DIAGNOSIS — F29 Unspecified psychosis not due to a substance or known physiological condition: Secondary | ICD-10-CM | POA: Insufficient documentation

## 2011-01-23 DIAGNOSIS — M549 Dorsalgia, unspecified: Secondary | ICD-10-CM | POA: Insufficient documentation

## 2011-01-23 DIAGNOSIS — B20 Human immunodeficiency virus [HIV] disease: Secondary | ICD-10-CM | POA: Insufficient documentation

## 2011-01-23 DIAGNOSIS — K7460 Unspecified cirrhosis of liver: Secondary | ICD-10-CM | POA: Insufficient documentation

## 2011-01-23 DIAGNOSIS — B181 Chronic viral hepatitis B without delta-agent: Secondary | ICD-10-CM | POA: Insufficient documentation

## 2011-01-23 DIAGNOSIS — J438 Other emphysema: Secondary | ICD-10-CM | POA: Insufficient documentation

## 2011-01-23 DIAGNOSIS — F329 Major depressive disorder, single episode, unspecified: Secondary | ICD-10-CM | POA: Insufficient documentation

## 2011-01-23 DIAGNOSIS — R42 Dizziness and giddiness: Principal | ICD-10-CM | POA: Insufficient documentation

## 2011-01-23 DIAGNOSIS — Z79899 Other long term (current) drug therapy: Secondary | ICD-10-CM | POA: Insufficient documentation

## 2011-01-23 DIAGNOSIS — Q8740 Marfan's syndrome, unspecified: Secondary | ICD-10-CM | POA: Insufficient documentation

## 2011-01-23 DIAGNOSIS — B370 Candidal stomatitis: Secondary | ICD-10-CM | POA: Insufficient documentation

## 2011-01-23 DIAGNOSIS — F3289 Other specified depressive episodes: Secondary | ICD-10-CM | POA: Insufficient documentation

## 2011-01-23 DIAGNOSIS — R079 Chest pain, unspecified: Secondary | ICD-10-CM | POA: Insufficient documentation

## 2011-01-23 DIAGNOSIS — Z9181 History of falling: Secondary | ICD-10-CM | POA: Insufficient documentation

## 2011-01-23 LAB — URINALYSIS, ROUTINE W REFLEX MICROSCOPIC
Ketones, ur: NEGATIVE mg/dL
Leukocytes, UA: NEGATIVE
Protein, ur: NEGATIVE mg/dL
Urobilinogen, UA: >8 mg/dL — ABNORMAL HIGH (ref 0.0–1.0)

## 2011-01-23 LAB — COMPREHENSIVE METABOLIC PANEL
Alkaline Phosphatase: 94 U/L (ref 39–117)
BUN: 14 mg/dL (ref 6–23)
CO2: 27 mEq/L (ref 19–32)
Chloride: 101 mEq/L (ref 96–112)
Creatinine, Ser: 1.06 mg/dL (ref 0.50–1.35)
GFR calc non Af Amer: >60 mL/min (ref >60)
Glucose, Bld: 74 mg/dL (ref 70–99)
Potassium: 3.9 mEq/L (ref 3.5–5.1)
Total Bilirubin: 0.7 mg/dL (ref 0.3–1.2)

## 2011-01-23 LAB — CBC
MCH: 28.3 pg (ref 26.0–34.0)
MCHC: 34.4 g/dL (ref 30.0–36.0)
Platelets: 131 10*3/uL — ABNORMAL LOW (ref 150–400)
RBC: 4.06 MIL/uL — ABNORMAL LOW (ref 4.22–5.81)
RDW: 15.9 % — ABNORMAL HIGH (ref 11.5–15.5)

## 2011-01-23 LAB — DIFFERENTIAL
Basophils Relative: 0 % (ref 0–1)
Eosinophils Absolute: 0.1 10*3/uL (ref 0.0–0.7)
Eosinophils Relative: 2 % (ref 0–5)
Lymphs Abs: 0.8 10*3/uL (ref 0.7–4.0)
Monocytes Absolute: 0.2 10*3/uL (ref 0.1–1.0)
Monocytes Relative: 8 % (ref 3–12)

## 2011-01-23 LAB — CK TOTAL AND CKMB (NOT AT ARMC): Total CK: 23 U/L (ref 7–232)

## 2011-01-23 MED ORDER — IOHEXOL 300 MG/ML  SOLN: 80.0000 mL | Freq: Once | INTRAMUSCULAR | Status: AC | PRN

## 2011-01-23 NOTE — H&P (Signed)
Hospital Admission Note Date: 01/23/2011  Patient name: [name] Medical record number: [id number] Date of birth: Jul 22, 1960 Age: 50 y.o. Gender: male PCP: Johny Sax, MD, MD  Medical Service: Internal Medicine Teaching Service  Attending physician:  Dr. Josem Kaufmann Resident [phone]): Dr. Baltazar Apo  Pager: [phone] Resident (R1): Dr. Milbert Coulter   Pager: [phone]  Chief Complaint: Multiple falls, confusion  History of Present Illness: [name] is a 50 year old with a history of HIV, Hepatitis B, and peripheral neuropathy who presents with approximately 10 days of confusion and three recent falls. He reports that his head went through the stucco wall during his first fall approximately 10 days ago. He denies any loss of consciousness after the fall and reports that the falls are not associated with standing up and describes himself as being wobbly and his legs as feeling 'rubbery'. He also complained of sharp pain in his left chest that resolved while in the emergency room. He also complains of visual changes that occur after blinking in the same time period. He lives alone and self administers his medications and uses a cane to ambulate. He denies any infectious symptoms such as headache, nausea, vomiting, dyspnea, cough, urinary symptoms.  Meds:  Current Outpatient Prescriptions  Medication Sig Dispense Refill  . ALPRAZolam (XANAX) 0.5 MG tablet Take 0.5 mg by mouth every 8 (eight) hours.        . clonazePAM (KLONOPIN) 2 MG tablet Take 2 mg by mouth 2 (two) times daily as needed.        Marland Kitchen FREESTYLE LITE test strip USE AS DIRECTED  32 each  6  . furosemide (LASIX) 40 MG tablet Take 40 mg by mouth daily.        . hydrocortisone 1 % cream Apply 1 application topically 2 (two) times daily. Apply to rectum       . omeprazole (PRILOSEC) 40 MG capsule Take 40 mg by mouth daily.        Marland Kitchen oxyCODONE (OXYCONTIN) 40 MG 12 hr tablet Take 1 tablet (40 mg total) by mouth every 12 (twelve) hours.  60 tablet  0  .  PROMETHAZINE HCL PO Take 25 mg by mouth 3 (three) times daily as needed.        Marland Kitchen PROPRANOLOL HCL PO Take 20 mg by mouth 2 (two) times daily.        Marland Kitchen spironolactone (ALDACTONE) 25 MG tablet Take 25 mg by mouth daily.         Allergies:Tetracycline hcl  Past Medical History  Diagnosis Date  . Chronic hepatitis B   . HIV (human immunodeficiency virus infection)   . Cholesteatoma   . Depression    Past Surgical History  Procedure Date  . Cholesteotoma removal    No family history on file.  History   Social History  . Marital Status: Single    Spouse Name: [name]    Number of Children: [name]  . Years of Education: [name]   Occupational History  . Not on file.   Social History Main Topics  . Smoking status: Current Everyday Smoker -- 1.5 packs/day for 35 years    Types: Cigarettes  . Smokeless tobacco: Never Used  . Alcohol Use: No  . Drug Use: No  . Sexually Active: No     pt. declined condoms   Other Topics Concern  . Not on file   Social History Narrative  . No narrative on file   Review of Systems: Otherwise negative except as  noted in HPI  Physical Exam:  Vitals: T: 98.6  HR: 96   BP: 105/75   RR: 20   O2 saturation: 100% RA General: tall male resting in bed HEENT: PERRL, EOMI, no scleral icterus,  Cardiac: RRR, 3/6 holosystolic murmur best heard at the apex Pulm: clear to auscultation bilaterally, moving normal volumes of air Abd: soft, nontender, nondistended, BS present Ext: cool and poorly perfused, no pedal edema, vasculopathic skin changes in bilateral LE Neuro: alert and oriented X3, cranial nerves II-XII grossly intact, strength and sensation to light touch equal in bilateral upper and lower extremities  Lab results: Results for orders placed during the hospital encounter of 01/23/11 (from the past 24 hour(s))  DIFFERENTIAL     Status: Normal      Component Value Range   Neutrophils Relative 63  43 - 77 (%)   Neutro Abs 1.8  1.7 - 7.7 (K/uL)    Lymphocytes Relative 27  12 - 46 (%)   Lymphs Abs 0.8  0.7 - 4.0 (K/uL)   Monocytes Relative 8  3 - 12 (%)   Monocytes Absolute 0.2  0.1 - 1.0 (K/uL)   Eosinophils Relative 2  0 - 5 (%)   Eosinophils Absolute 0.1  0.0 - 0.7 (K/uL)   Basophils Relative 0  0 - 1 (%)   Basophils Absolute 0.0  0.0 - 0.1 (K/uL)  CBC     Status: Abnormal      Component Value Range   WBC 2.9 (*) 4.0 - 10.5 (K/uL)   RBC 4.06 (*) 4.22 - 5.81 (MIL/uL)   Hemoglobin 11.5 (*) 13.0 - 17.0 (g/dL)   HCT 40.9 (*) 81.1 - 52.0 (%)   MCV 82.3  78.0 - 100.0 (fL)   MCH 28.3  26.0 - 34.0 (pg)   MCHC 34.4  30.0 - 36.0 (g/dL)   RDW 91.4 (*) 78.2 - 15.5 (%)   Platelets 131 (*) 150 - 400 (K/uL)  CK TOTAL AND CKMB     Status: Normal      Component Value Range   Total CK 23  7 - 232 (U/L)   CK, MB 1.1  0.3 - 4.0 (ng/mL)   Relative Index RELATIVE INDEX IS INVALID  0.0 - 2.5   TROPONIN I     Status: Normal      Component Value Range   Troponin I <0.30  <0.30 (ng/mL)  COMPREHENSIVE METABOLIC PANEL     Status: Abnormal      Component Value Range   Sodium 138  135 - 145 (mEq/L)   Potassium 3.9  3.5 - 5.1 (mEq/L)   Chloride 101  96 - 112 (mEq/L)   CO2 27  19 - 32 (mEq/L)   Glucose, Bld 74  70 - 99 (mg/dL)   BUN 14  6 - 23 (mg/dL)   Creatinine, Ser 9.56  0.50 - 1.35 (mg/dL)   Calcium 9.1  8.4 - 21.3 (mg/dL)   Total Protein 8.4 (*) 6.0 - 8.3 (g/dL)   Albumin 3.1 (*) 3.5 - 5.2 (g/dL)   AST 26  0 - 37 (U/L)   ALT 20  0 - 53 (U/L)   Alkaline Phosphatase 94  39 - 117 (U/L)   Total Bilirubin 0.7  0.3 - 1.2 (mg/dL)   GFR calc non Af Amer >60  >60 (mL/min)   GFR calc Af Amer >60  >60 (mL/min)  AMMONIA - [place] ONLY     Status: Abnormal      Component  Value Range   Ammonia <10 (*) 11 - 60 (umol/L)  URINALYSIS, ROUTINE W REFLEX MICROSCOPIC     Status: Abnormal      Component Value Range   Color, Urine YELLOW  YELLOW    Appearance HAZY (*) CLEAR    Specific Gravity, Urine >1.046 (*) 1.005 - 1.030    pH 7.0  5.0 - 8.0     Glucose, UA NEGATIVE  NEGATIVE (mg/dL)   Hgb urine dipstick NEGATIVE  NEGATIVE    Bilirubin Urine SMALL (*) NEGATIVE    Ketones, ur NEGATIVE  NEGATIVE (mg/dL)   Protein, ur NEGATIVE  NEGATIVE (mg/dL)   Urobilinogen, UA >7.8 (*) 0.0 - 1.0 (mg/dL)   Nitrite NEGATIVE  NEGATIVE    Leukocytes, UA NEGATIVE  NEGATIVE    Imaging results:  CHEST - 2 VIEW: IMPRESSION: Stable COPD/emphysema and chronic elevation of the left hemidiaphragm with chronic atelectasis and/or scarring in the left lower lobe.  No acute cardiopulmonary disease.   CT HEAD WITHOUT CONTRAST   IMPRESSION: 1. Negative for bleed or other acute intracranial process. 2.  Postop changes as above.   CT ANGIOGRAPHY CHEST WITH CONTRAST    IMPRESSION:  1.  Negative for pulmonary embolus or thoracic aortic dissection. 2.  Bibasilar dependent consolidation or atelectasis. 3. Cirrhosis and splenomegaly as before.    Other results: EKG: no major changes from previous tracings  Assessment & Plan by Problem:  1) Frequent falls and instability: Likely due to Medications. He is on Xanax, clonazepam,NOrtryptiline, OxyContin.  He also has hepatic cirrhosis, although his LFTs are normal. Other differentials include HIV dementia- as he reports recent onset confusion and change in memory. - Will admit to regular bed -Hold Xanax and clonazepam.  - Will do PT OT evaluation -Social work consult for home safety measures.  2) Chest pain: Atypical, with EKG showing no signs of ACS and cardiac enzymes x 1 negative in ED.  CT angiogram chest shows no PE or dissection. Chest pain resolved in the ED. Will consider cyclical enzymes if chest pain recurs.  3) HIV: Last CD4 count 110 in April 2012. - Followed by Dr. Ninetta Lights. - Not on any HIV medications at home and last note from Dr. Moshe Cipro office visit says that he was encouraged to start back on his HIV medications.  4) Depression/Anxiety: Medications might be the cause of his current problem. -  Will hold benzos as per #1. -Needs adjustment of his medications.  5) Hepatic cirrhosis: LFTs normal. CT scan and past imaging consistent with cirrhosis of liver. -Will continue spironolactone and Lasix.  6) VTE Prophylaxis Lovenox 40mg  QD.    R2/3______________________________      R1________________________________  ATTENDING: I performed and/or observed a history and physical examination of the patient.  I discussed the case with the residents as noted and reviewed the residents' notes.  I agree with the findings and plan--please refer to the attending physician note for more details.  Signature________________________________  Printed Name_____________________________

## 2011-01-24 DIAGNOSIS — Z9181 History of falling: Secondary | ICD-10-CM

## 2011-01-24 DIAGNOSIS — R413 Other amnesia: Secondary | ICD-10-CM

## 2011-01-24 DIAGNOSIS — B20 Human immunodeficiency virus [HIV] disease: Secondary | ICD-10-CM

## 2011-01-24 LAB — BASIC METABOLIC PANEL
Calcium: 8.8 mg/dL (ref 8.4–10.5)
Creatinine, Ser: 0.93 mg/dL (ref 0.50–1.35)
GFR calc non Af Amer: >60 mL/min (ref >60)
Glucose, Bld: 93 mg/dL (ref 70–99)
Sodium: 138 mEq/L (ref 135–145)

## 2011-01-25 LAB — BASIC METABOLIC PANEL
BUN: 9 mg/dL (ref 6–23)
Chloride: 104 mEq/L (ref 96–112)
GFR calc Af Amer: >60 mL/min (ref >60)
GFR calc non Af Amer: >60 mL/min (ref >60)
Potassium: 3.7 mEq/L (ref 3.5–5.1)
Sodium: 137 mEq/L (ref 135–145)

## 2011-01-26 LAB — HIV-1 RNA QUANT-NO REFLEX-BLD
HIV 1 RNA Quant: 78400 copies/mL — ABNORMAL HIGH (ref <20)
HIV-1 RNA Quant, Log: 4.89 {Log} — ABNORMAL HIGH (ref <1.30)

## 2011-01-26 LAB — T-HELPER CELLS (CD4) COUNT (NOT AT ARMC): CD4 % Helper T Cell: 10 % — ABNORMAL LOW (ref 33–55)

## 2011-01-26 LAB — URINE CULTURE: Colony Count: 50000

## 2011-01-28 LAB — URINE CULTURE: Culture  Setup Time: 201207231925

## 2011-02-01 LAB — HIV-1 GENOTYPR PLUS: Resistance Assoc RT Mutations: NOT DETECTED

## 2011-02-02 ENCOUNTER — Ambulatory Visit (INDEPENDENT_AMBULATORY_CARE_PROVIDER_SITE_OTHER): Payer: PRIVATE HEALTH INSURANCE | Admitting: Infectious Diseases

## 2011-02-02 ENCOUNTER — Encounter: Payer: Self-pay | Admitting: Infectious Diseases

## 2011-02-02 DIAGNOSIS — B20 Human immunodeficiency virus [HIV] disease: Secondary | ICD-10-CM

## 2011-02-02 DIAGNOSIS — R4182 Altered mental status, unspecified: Secondary | ICD-10-CM

## 2011-02-02 DIAGNOSIS — F17200 Nicotine dependence, unspecified, uncomplicated: Secondary | ICD-10-CM

## 2011-02-02 NOTE — Assessment & Plan Note (Signed)
He needs to get onto ART to see if we can improve a possible HIV dementia or encephalopathy. Will see him back in 3-4 days. He does not want to go to SNF. Family would like to get him some home help. Wants to be DNR if he does not have a correctable condition. Has a living will but has not filled it out.

## 2011-02-02 NOTE — Progress Notes (Signed)
  Subjective:    Patient ID: [name], male    DOB: [birth year], 50 y.o.   MRN: [id number]  HPI  50 yo M with neuropathy, HIV/AIDS, DM that is diet controlled, suspected Marfan's syndrome, and COPD. CD4 110, VL 56k (10-18-2010).  Cont to smoke occasionally. He was written new ART in 2011 (TRV/ISN) and has not yet started. He was adm to [facility] 7-23 to 7-25 for dizziness and falls which was felt to be multi-factorial. He had CT head negative, cardiac enzymes negative. CD4 50, VL 78,400 and genotype naive.  Was given new SSRI which gave him headaches and he went back to xanax. He still has difficulty with short term memory loss, falling. Still having some paranoia. History provided by his sister as well today [phone]). Is getting home help and also from his sister and her kids. They do not want him in a SNF. Bowels moving well. C/o headaches and R ear pain.  Has lost 17# since May.   Review of Systems     Objective:   Physical Exam  Constitutional:       wasting  HENT:  Ears:  Eyes: EOM are normal. Pupils are equal, round, and reactive to light.  Neck: Neck supple.  Cardiovascular: Normal rate, regular rhythm and normal heart sounds.   Pulmonary/Chest: Effort normal and breath sounds normal.  Abdominal: Soft. Bowel sounds are normal. He exhibits distension. There is no tenderness.          Assessment & Plan:

## 2011-02-02 NOTE — Assessment & Plan Note (Signed)
He has prev been on [place] and had trouble tolerating the dreams. Will change him to complera. See him back in 3-4 weeks.

## 2011-02-02 NOTE — Assessment & Plan Note (Signed)
Encouraged to quit. 

## 2011-02-04 ENCOUNTER — Other Ambulatory Visit: Payer: Self-pay | Admitting: *Deleted

## 2011-02-04 DIAGNOSIS — G629 Polyneuropathy, unspecified: Secondary | ICD-10-CM

## 2011-02-04 DIAGNOSIS — B20 Human immunodeficiency virus [HIV] disease: Secondary | ICD-10-CM

## 2011-02-04 MED ORDER — EMTRICITABINE-TENOFOVIR DF 200-300 MG PO TABS: 1.0000 | ORAL_TABLET | Freq: Every day | ORAL | Status: DC

## 2011-02-04 MED ORDER — OXYCODONE HCL 40 MG PO TB12: 40.0000 mg | ORAL_TABLET | Freq: Two times a day (BID) | ORAL | Status: DC

## 2011-02-04 MED ORDER — RALTEGRAVIR POTASSIUM 400 MG PO TABS: 400.0000 mg | ORAL_TABLET | Freq: Two times a day (BID) | ORAL | Status: DC

## 2011-02-05 NOTE — Discharge Summary (Signed)
NAMERIVEN, MABILE NO.:  [id number]  MEDICAL RECORD NO.:  [id number]  LOCATION:  3023                         FACILITY:  [facility]  PHYSICIAN:  Doneen Poisson, MD     DATE OF BIRTH:  [birth year]  DATE OF ADMISSION:  01/23/2011 DATE OF DISCHARGE:  01/25/2011                              DISCHARGE SUMMARY   DISCHARGE DIAGNOSES: 1. Multifactorial dizziness. 2. Human immunodeficiency virus. 3. Depression. 4. Anxiety. 5. Hepatic cirrhosis. 6. Oral candidiasis.  DISCHARGE MEDICATIONS: 1. Citalopram 20 mg by mouth daily. 2. Fluconazole 100 mg by mouth daily for 7 days. 3. Clonazepam 1 mg by mouth daily at bedtime. 4. Furosemide 40 mg 1 tablet by mouth.  The patient was instructed to     hold furosemide every other day until his follow-up appointment at     [facility]. 5. Nortriptyline 75 mg 2 capsules by mouth daily at bedtime. 6. Omeprazole 40 mg by mouth every morning. 7. OxyContin 40 mg 1 tablet by mouth twice daily p.r.n. 8. Propranolol 20 mg 1 tablet by mouth twice daily. 9. Spironolactone 25 mg 1 tablet by mouth every morning.  DISPOSITION AND FOLLOWUP:  [name] is medically stable for discharge to home.  He was instructed to follow up with Dr. Ninetta Lights at his first available appointment to discuss restarting HIV medications.  He is also instructed to follow up with Dr. Donell Beers, his outpatient psychiatrist, regarding changes in his psychotropic medications.  After he was discharged, I called both him and his sister, [name] at [phone] who takes care of him to discuss holding Lasix every other day for 1 week until they follow up at the [facility]. The patient's sister agreed to make follow-up appointments as they wanted to ensure appropriate scheduling.  His sister is supportive and understood  all the changes and suggestions made at discharge.  PROCEDURES: 1. Chest x-ray; chronic  elevation of the left hemidiaphragm with chronic      atelectasis and/or scarring in the left lower lobe.  No acute      cardiopulmonary disease. 2. CT of head negative for bleed or other acute intracranial process. 3. CT angio of chest negative for pulmonary embolus or thoracic aortic     dissection, bibasilar dependent consolidation or atelectasis,     cirrhosis, and splenomegaly.  CONSULTATIONS:  None.  CHIEF COMPLAINT:  Multiple falls and confusion.  [name] is a 50 year old man with history of HIV, hepatitis B, and peripheral neuropathy who presents with approximately 10 days of confusion and recent falls.  He reports that his head went through the stucco wall during his first fall approximately 10 days ago.  He denies any loss of consciousness after the fall and reports that the falls are not associated with standing and describes himself as being wobbly and his legs feeling rubbery.  He also complained of sharp pain in his left chest that resolved while in the emergency room.  He also complained of visual changes that occur at the same time.  He lives alone and self  administers his medication and uses a cane to ambulate.  He  denies any  infectious symptoms such as headache, nausea, vomiting, dyspnea, cough,  or urinary symptoms.  PHYSICAL EXAMINATION:  VITAL SIGNS:  Temperature 98.6, heart rate 96, blood pressure 105/75,  respiratory rate 28, O2 saturation 100% on room air. GENERAL:  Tall, resting in bed. HEENT:  Pupils equal, round, and reactive to light.  Extraocular muscles intact.  No scleral icterus.  No upper dentures. CARDIAC:  Regular rate and rhythm.  3/6 holosystolic murmur best heard at the apex. PULMONARY:  Clear to auscultation bilaterally.  Moving normal volumes of air. ABDOMEN:  Soft, nontender, and nondistended.  Bowel sounds present. EXTREMITIES:  Cool and poorly perfused.  No pedal edema.  Vasculopathic skin changes in bilateral lower  extremities. NEUROLOGIC:  Alert and oriented x 3.  Cranial nerves II through XII grossly intact.  Strength and sensation to light touch equal in bilateral upper and lower extremities.  LABORATORY DATA AT ADMISSION:  White blood cell count 2.9, hemoglobin 11.5, hematocrit 33.4, platelets 131, MCV 82.3.  Total CK 23, CK-MB 1.1, troponin less than 0.3.  Sodium 138, potassium 3.9, chloride 101, CO2 27, glucose 74, BUN 14, creatinine 1.06, calcium 9.1.  Total protein 8.4, albumin 3.1, AST 26, ALT 20, alkaline phosphatase 94, total bilirubin 0.7.  Ammonia less than 10.  Urinalysis; color yellow, appearance hazy, specific gravity greater than 1.046, pH 7, glucose negative, hemoglobin urine dipstick negative, bilirubin urine small, ketones negative, protein negative, urobilinogen greater than 8, nitrite negative, and leukocytes negative.  HOSPITAL COURSE: 1. Dizziness and recent falls.  [name] has several predisposing     factors such as peripheral neuropathy and history of cholesteatoma.     He was also being treated with 2 benzodiazepines and was found to      be orthostatic.  He endorses taking oregano oil which we found can      cause dizziness.  He was orally hydrated during the hospitalization      and he promised to increase fluids at home.  He is on Lasix and      spironolactone which is contributing to his dehydration, but is      likely worsened now because of the summer heat and he does not keep      his apartment cool.  Given that this is a new problem and the high      temperature is temporary, we encouraged p.o. fluids.  We also asked      that for the next week he take the Lasix every other day since he      does keep his apartment warm.  We discontinued the Xanax and he      agreed to discontinue the oregano oil.  At discharge, he reported      feeling steady and had no complaints of dizziness.  2. Confusion.  The sister of [name] describes some confusion for 10      days  which she charaterizes as a short-term memory problem.  He      did not exhibit these problems during the hospitalization but it      is possible that he may be experiencing memory deficits to either      depression or possibly the onset of HIV dementia.  We discussed      keeping an eye on these problems and discussing these problems      with both his psychiatrist and ID physician.  3. Depression and anxiety.  [name] was found to be very  tangential during my encounter.  He also had complaints of feeling     down and exhausted but denied suicidal ideation.  Because we     discontinued his Xanax, we started him on citalopram which can be     used to treat both anxiety and depression.  He preferred     to make follow up appointments on his own since he wanted control     over what time he would be seen.  His sister was also in     the room while I gave discharge instructions and she assured that     he would be followed up.  He agreed to follow up with Dr. Donell Beers,     his outpatient psychiatrist, in 2 weeks to monitor progress and his     new medication regimen.  We continued his nortriptyline during and     after hospitalization.  He was instructed to follow up with Dr.     Donell Beers sooner should his symptoms worsens or if he experiences     suicidal ideation.  4. Hypokalemia.  [name] potassium was normal at admission and     dropped during hospitalization.  He was repleted during     hospitalization.  He denied chest pain and palpitations.  He is on     Lasix and spironolactone and Lasix may be causing potassium wasting     more than the spironolactone is causing potassium sparing.  His     diuretics need to be readjusted as an outpatient.  For now, he was     instructed to hold Lasix every other day until he follows up with     the [facility] in 1 week.  He may need a     BMET at this appointment.  He was encouraged to eat a banana on     most  days.  5. Oral candidiasis.  On day of discharge, [name] exhibited signs     of oral thrush.  He was started on fluconazole 100 mg daily for a     total of 7 days.  6. HIV.  [name] is not currently on ART.  His last CD-4 count in     April 2012 was 110.  A repeat CD-4 count, viral load, and a HIV     genotype were ordered during this hospitalization and he is to     follow up with ID Clinic to receive results.  At discharge, he     was agreeable to starting ART.  For this reason, we encouraged      him to follow up with Dr. Ninetta Lights at his first available     appointment.  7. Cirrhosis.  [name] is on Lasix and spironolactone for his     liver failure which is secondary to hepatitis B.  Physical exam did     not suggest extensive ascites, but he is distended.  There were no     acute issues regarding his liver failure during this     hospitalization.  DISCHARGE VITAL SIGNS:  Temperature 97, pulse 91, respiratory rate 18, blood pressure 102/74, oxygen saturation 96% on room air.  DISCHARGE LABS:Sodium 137, potassium 3.7, chloride 104, CO2 23,  glucose 159, BUN 9, creatinine 0.87, calcium 8.4, magnesium 1.6.   ______________________________ Vernice Jefferson, MD   ______________________________ Doneen Poisson, MD   NK/MEDQ  D:  01/26/2011  T:  01/27/2011  Job:  [id number]  cc:  Lacretia Leigh. Ninetta Lights, M.D.  Electronically Signed by Vernice Jefferson MD on 01/27/2011 02:25:57 PM Electronically Signed by Doneen Poisson  on 02/05/2011 05:45:32 PM

## 2011-02-14 ENCOUNTER — Other Ambulatory Visit: Payer: Self-pay | Admitting: Infectious Diseases

## 2011-02-14 DIAGNOSIS — L309 Dermatitis, unspecified: Secondary | ICD-10-CM

## 2011-02-16 ENCOUNTER — Encounter: Payer: Self-pay | Admitting: Infectious Diseases

## 2011-02-16 ENCOUNTER — Ambulatory Visit (INDEPENDENT_AMBULATORY_CARE_PROVIDER_SITE_OTHER): Payer: PRIVATE HEALTH INSURANCE | Admitting: Infectious Diseases

## 2011-02-16 VITALS — BP 125/80 | HR 106 | Temp 98.0°F | Ht 74.0 in | Wt 217.0 lb

## 2011-02-16 DIAGNOSIS — B20 Human immunodeficiency virus [HIV] disease: Secondary | ICD-10-CM

## 2011-02-16 DIAGNOSIS — Z23 Encounter for immunization: Secondary | ICD-10-CM

## 2011-02-16 MED ORDER — OXYCODONE HCL 5 MG PO TABS: 5.0000 mg | ORAL_TABLET | Freq: Three times a day (TID) | ORAL | Status: DC | PRN

## 2011-02-16 NOTE — Assessment & Plan Note (Addendum)
Will recheck his CD4 and VL to see if he has had any response. He gets flu shot today. I encouraged him to complete his living will. He is given a rx for oxycodone IR for breakthrough. Will see him back in 1 month.

## 2011-02-16 NOTE — Progress Notes (Signed)
  Subjective:    Patient ID: ANTRON SETH, male    DOB: 1961-03-13, 50 y.o.   MRN: [id number]  HPI 50 yo M with neuropathy, HIV/AIDS, DM that is diet controlled, suspected Marfan's syndrome, and COPD. CD4 110, VL 56k (10-18-2010).  Cont to smoke occasionally. He was written new ART in 2011 (TRV/ISN) and has not yet started.  He was adm to [facility] 7-23 to 7-25 for dizziness and falls which was felt to be multi-factorial. He had CT head negative, cardiac enzymes negative.  CD4 50, VL 78,400 and genotype naive (01-25-11). Was given new SSRI which gave him headaches and he went back to xanax. sister is very involved in his care [phone]). Is getting home help. Feels better, feels like his memory is improved. Is taking his ART with home help and his sister. Had his toenails trimmed by his sister   Review of Systems     Objective:   Physical Exam  Constitutional: No distress.  Musculoskeletal: He exhibits edema.  Neurological:       Decreased light touch bilaterally          Assessment & Plan:

## 2011-02-17 ENCOUNTER — Other Ambulatory Visit: Payer: Self-pay | Admitting: Licensed Clinical Social Worker

## 2011-02-17 DIAGNOSIS — B370 Candidal stomatitis: Secondary | ICD-10-CM

## 2011-02-17 LAB — T-HELPER CELL (CD4) - (RCID CLINIC ONLY): CD4 T Cell Abs: 80 uL — ABNORMAL LOW (ref 400–2700)

## 2011-02-17 MED ORDER — FLUCONAZOLE 100 MG PO TABS: 100.0000 mg | ORAL_TABLET | Freq: Every day | ORAL | Status: AC

## 2011-02-18 ENCOUNTER — Other Ambulatory Visit: Payer: Self-pay | Admitting: Licensed Clinical Social Worker

## 2011-02-18 DIAGNOSIS — R609 Edema, unspecified: Secondary | ICD-10-CM

## 2011-02-18 MED ORDER — FUROSEMIDE 40 MG PO TABS: 40.0000 mg | ORAL_TABLET | Freq: Every day | ORAL | Status: DC

## 2011-03-01 ENCOUNTER — Telehealth: Payer: Self-pay | Admitting: Licensed Clinical Social Worker

## 2011-03-01 NOTE — Telephone Encounter (Signed)
Patient wants to switch to the complera because the [place] and [place] makes him feel "loopy" and he would also like something stronger for pain because he is having to take the oxycodone 40 mg more often than every 12 to relieve the pain.

## 2011-03-03 ENCOUNTER — Other Ambulatory Visit: Payer: Self-pay | Admitting: *Deleted

## 2011-03-03 ENCOUNTER — Telehealth: Payer: Self-pay | Admitting: *Deleted

## 2011-03-03 DIAGNOSIS — B20 Human immunodeficiency virus [HIV] disease: Secondary | ICD-10-CM

## 2011-03-03 MED ORDER — EMTRICITAB-RILPIVIR-TENOFOV DF 200-25-300 MG PO TABS: 1.0000 | ORAL_TABLET | Freq: Every day | ORAL | Status: DC

## 2011-03-03 NOTE — Telephone Encounter (Signed)
Notified patient and his sister that Dr. Ninetta Lights changed his medication to Complera and it was sent to his pharmacy.  Patient was asking for his pain medication to be increased or switched to something else.  This is not something Dr. Ninetta Lights is wanting to do at this time and they can discuss further with him at his appointment in September.  Sister is concerned there is something else going on with [name] because at times he seems "very out of it" and other times he is fine.  Will discuss further with Dr. Ninetta Lights at his next office visit. Wendall Mola CMA

## 2011-03-03 NOTE — Telephone Encounter (Signed)
Medication problem resolved by Dr. Ninetta Lights, patient notified.

## 2011-03-09 ENCOUNTER — Other Ambulatory Visit: Payer: Self-pay | Admitting: *Deleted

## 2011-03-09 DIAGNOSIS — G629 Polyneuropathy, unspecified: Secondary | ICD-10-CM

## 2011-03-09 MED ORDER — OXYCODONE HCL 40 MG PO TB12: 40.0000 mg | ORAL_TABLET | Freq: Two times a day (BID) | ORAL | Status: DC

## 2011-03-29 LAB — T-HELPER CELL (CD4) - (RCID CLINIC ONLY): CD4 % Helper T Cell: 13 — ABNORMAL LOW

## 2011-03-30 ENCOUNTER — Ambulatory Visit (INDEPENDENT_AMBULATORY_CARE_PROVIDER_SITE_OTHER): Payer: PRIVATE HEALTH INSURANCE | Admitting: Infectious Diseases

## 2011-03-30 ENCOUNTER — Encounter: Payer: Self-pay | Admitting: Infectious Diseases

## 2011-03-30 DIAGNOSIS — B20 Human immunodeficiency virus [HIV] disease: Secondary | ICD-10-CM

## 2011-03-30 DIAGNOSIS — I872 Venous insufficiency (chronic) (peripheral): Secondary | ICD-10-CM

## 2011-03-30 DIAGNOSIS — E8779 Other fluid overload: Secondary | ICD-10-CM

## 2011-03-30 DIAGNOSIS — R609 Edema, unspecified: Secondary | ICD-10-CM

## 2011-03-30 MED ORDER — FUROSEMIDE 40 MG PO TABS
20.0000 mg | ORAL_TABLET | Freq: Every day | ORAL | Status: DC
Start: 2011-03-30 — End: 2011-05-25

## 2011-03-30 MED ORDER — OXYCODONE HCL 5 MG PO TABS: 5.0000 mg | ORAL_TABLET | Freq: Three times a day (TID) | ORAL | Status: AC | PRN

## 2011-03-30 NOTE — Assessment & Plan Note (Addendum)
Have tried to convince him to take his complera. See him back in 6 weeks. Explained to him that due to his overall health issues, i doubt that any medication will be "easy" for him to take.

## 2011-03-30 NOTE — Assessment & Plan Note (Signed)
Have asked him to 1/2 hi lasix. He appears dry today.

## 2011-03-30 NOTE — Progress Notes (Signed)
  Subjective:    Patient ID: [name], male    DOB: 05/07/1961, 50 y.o.   MRN: [id number]  HPI 50 yo M with neuropathy, HIV/AIDS, DM that is diet controlled, suspected Marfan's syndrome, and COPD. CD4 110, VL 56k (10-18-2010).  Cont to smoke occasionally. He was written new ART in 2011 (TRV/ISN) and has not yet started.  He was adm to [facility] 7-23 to 7-25 for dizziness and falls which was felt to be multi-factorial. He had CT head negative, cardiac enzymes negative.  CD4 50, VL 78,400 and genotype naive (01-25-11). He was started on complera and f/u lab (02-16-2011) CD4 80, VL 805.  States today that he has come off complera due to prev n/v, diarrhea. Not sure if he needs his labs done today.  Feels better- has lost weight, looks healthier. Memory is better.  sister is very involved in his care [phone]     Review of Systems  HENT: Positive for mouth sores (occas tongue swelling. ).        Objective:   Physical Exam  Constitutional: He appears well-developed and well-nourished. No distress.  Eyes: EOM are normal. Pupils are equal, round, and reactive to light.  Neck: Neck supple.  Cardiovascular: Normal rate, regular rhythm and normal heart sounds.   Pulmonary/Chest: Effort normal and breath sounds normal.  Abdominal: Soft. Bowel sounds are normal. He exhibits distension.  Lymphadenopathy:    He has no cervical adenopathy.  Skin:       Venous varicosities of feet.           Assessment & Plan:

## 2011-04-01 LAB — T-HELPER CELL (CD4) - (RCID CLINIC ONLY): CD4 % Helper T Cell: 19 — ABNORMAL LOW

## 2011-04-05 ENCOUNTER — Other Ambulatory Visit: Payer: Self-pay | Admitting: *Deleted

## 2011-04-05 DIAGNOSIS — Z8619 Personal history of other infectious and parasitic diseases: Secondary | ICD-10-CM

## 2011-04-05 MED ORDER — SPIRONOLACTONE 25 MG PO TABS: 25.0000 mg | ORAL_TABLET | Freq: Every day | ORAL | Status: DC

## 2011-04-07 ENCOUNTER — Telehealth: Payer: Self-pay

## 2011-04-07 DIAGNOSIS — G629 Polyneuropathy, unspecified: Secondary | ICD-10-CM

## 2011-04-07 MED ORDER — OXYCODONE HCL 40 MG PO TB12: 40.0000 mg | ORAL_TABLET | Freq: Two times a day (BID) | ORAL | Status: DC

## 2011-04-08 LAB — T-HELPER CELL (CD4) - (RCID CLINIC ONLY)
CD4 % Helper T Cell: 16 % — ABNORMAL LOW (ref 33–55)
CD4 T Cell Abs: 260 uL — ABNORMAL LOW (ref 400–2700)

## 2011-04-14 LAB — T-HELPER CELL (CD4) - (RCID CLINIC ONLY): CD4 T Cell Abs: 250 — ABNORMAL LOW

## 2011-04-22 ENCOUNTER — Encounter: Payer: Self-pay | Admitting: Infectious Diseases

## 2011-04-22 ENCOUNTER — Ambulatory Visit (INDEPENDENT_AMBULATORY_CARE_PROVIDER_SITE_OTHER): Payer: PRIVATE HEALTH INSURANCE | Admitting: Infectious Diseases

## 2011-04-22 ENCOUNTER — Other Ambulatory Visit: Payer: Self-pay | Admitting: *Deleted

## 2011-04-22 VITALS — BP 130/89 | HR 97 | Temp 98.0°F | Wt 207.8 lb

## 2011-04-22 DIAGNOSIS — B20 Human immunodeficiency virus [HIV] disease: Secondary | ICD-10-CM

## 2011-04-22 DIAGNOSIS — I872 Venous insufficiency (chronic) (peripheral): Secondary | ICD-10-CM

## 2011-04-22 LAB — T-HELPER CELL (CD4) - (RCID CLINIC ONLY): CD4 T Cell Abs: 60 uL — ABNORMAL LOW (ref 400–2700)

## 2011-04-22 NOTE — Progress Notes (Signed)
  Subjective:    Patient ID: [name], male    DOB: Feb 13, 1961, 50 y.o.   MRN: [id number]  HPI 50 yo M with neuropathy, HIV/AIDS, DM that is diet controlled, suspected Marfan's syndrome, and COPD. CD4 110, VL 56k (10-18-2010).  He was written new ART in 2011 (TRV/ISN) but did not take.  He was adm to [facility] 7-23 to 7-25 for dizziness and falls which was felt to be multi-factorial. He had CT head negative, cardiac enzymes negative. Genotype naive (01-25-11). He was started on complera and f/u lab (02-16-2011) CD4 80, VL 805. Took intermittently.  Slept poorly last PM. Has not started meds since his last visit. Wants labs again today.    sister is very involved in his care [phone]).     Review of Systems     Objective:   Physical Exam  Constitutional: He appears well-developed and well-nourished.  Eyes: EOM are normal. Pupils are equal, round, and reactive to light.  Neck: Neck supple.  Cardiovascular: Normal rate, regular rhythm and normal heart sounds.   Pulmonary/Chest: Effort normal and breath sounds normal.  Abdominal: Soft. Bowel sounds are normal. There is no tenderness. There is no rebound.  Musculoskeletal: He exhibits edema.  Lymphadenopathy:    He has no cervical adenopathy.  Skin:             Assessment & Plan:

## 2011-04-22 NOTE — Assessment & Plan Note (Signed)
He is again encouraged to take his medications. His sister pledges to help him will see him back in 1 month.

## 2011-04-22 NOTE — Telephone Encounter (Signed)
He wanted to know when his pain meds were able to be refilled. Told caller he got it on 10/10 /12. Due 05/14/11. I could hear pt in the backround. He said ok

## 2011-04-22 NOTE — Assessment & Plan Note (Signed)
He has been taking 12.5 of lasix. Will check his K+ today, asked him to take 25mg  due to his edema. He most likely has cirrhosis. He has propranolol which i asked him to take for SBP > 140 (he has home BP monitor, last SPB 118). See him back in 1 month

## 2011-04-23 LAB — COMPREHENSIVE METABOLIC PANEL
AST: 27 U/L (ref 0–37)
Albumin: 3.5 g/dL (ref 3.5–5.2)
BUN: 16 mg/dL (ref 6–23)
CO2: 25 mEq/L (ref 19–32)
Calcium: 9 mg/dL (ref 8.4–10.5)
Chloride: 101 mEq/L (ref 96–112)
Creat: 0.83 mg/dL (ref 0.50–1.35)
Glucose, Bld: 82 mg/dL (ref 70–99)
Potassium: 4.4 mEq/L (ref 3.5–5.3)

## 2011-04-23 LAB — CBC
HCT: 33.7 % — ABNORMAL LOW (ref 39.0–52.0)
Hemoglobin: 10.7 g/dL — ABNORMAL LOW (ref 13.0–17.0)
MCHC: 31.8 g/dL (ref 30.0–36.0)
RBC: 3.89 MIL/uL — ABNORMAL LOW (ref 4.22–5.81)
WBC: 3.2 10*3/uL — ABNORMAL LOW (ref 4.0–10.5)

## 2011-04-25 LAB — HIV-1 RNA QUANT-NO REFLEX-BLD
HIV 1 RNA Quant: 64400 {copies}/mL — ABNORMAL HIGH
HIV-1 RNA Quant, Log: 4.81 {Log} — ABNORMAL HIGH

## 2011-05-25 ENCOUNTER — Encounter: Payer: Self-pay | Admitting: Infectious Diseases

## 2011-05-25 ENCOUNTER — Ambulatory Visit (INDEPENDENT_AMBULATORY_CARE_PROVIDER_SITE_OTHER): Payer: PRIVATE HEALTH INSURANCE | Admitting: Infectious Diseases

## 2011-05-25 DIAGNOSIS — G629 Polyneuropathy, unspecified: Secondary | ICD-10-CM

## 2011-05-25 DIAGNOSIS — B20 Human immunodeficiency virus [HIV] disease: Secondary | ICD-10-CM

## 2011-05-25 DIAGNOSIS — G589 Mononeuropathy, unspecified: Secondary | ICD-10-CM

## 2011-05-25 DIAGNOSIS — I872 Venous insufficiency (chronic) (peripheral): Secondary | ICD-10-CM

## 2011-05-25 MED ORDER — OXYCODONE HCL 40 MG PO TB12: 40.0000 mg | ORAL_TABLET | Freq: Two times a day (BID) | ORAL | Status: DC

## 2011-05-25 NOTE — Progress Notes (Signed)
  Subjective:    Patient ID: [name], male    DOB: 22-Aug-1960, 50 y.o.   MRN: [id number]  HPI 50 yo M with neuropathy, HIV/AIDS, DM that is diet controlled, suspected Marfan's syndrome, and COPD. CD4 110, VL 56k (10-18-2010).  He was written new ART in 2011 (TRV/ISN) but did not take.  He was adm to [facility] 7-23 to 7-25 for dizziness and falls which was felt to be multi-factorial. He had CT head negative, cardiac enzymes negative. Genotype naive (01-25-11). He was started on complera and f/u lab (02-16-2011) CD4 80, VL 805. Took intermittently then quit altogether. CD4 60 and VL 64,000.   Feels better, taking ART since April 24, 2011. Has not missed any doses, denies any side effects. "ALL IS GOOD". Wt has come back up some. apettite is improved. Needs to keep taking stool softener.    Review of Systems     Objective:   Physical Exam  Constitutional: He appears well-developed and well-nourished.  HENT:       Ear canals clean  Eyes: EOM are normal. Pupils are equal, round, and reactive to light.  Neck: Neck supple.  Cardiovascular: Normal rate, regular rhythm and normal heart sounds.   Pulmonary/Chest: Effort normal and breath sounds normal.  Abdominal: Soft. Bowel sounds are normal. There is no tenderness.  Musculoskeletal: He exhibits edema.  Lymphadenopathy:    He has no cervical adenopathy.          Assessment & Plan:

## 2011-05-25 NOTE — Assessment & Plan Note (Addendum)
Will recheck his Cr and K today. His edema is not significantly changed. Encouraged him to wear support hose.

## 2011-05-25 NOTE — Assessment & Plan Note (Addendum)
He is doing well. Will recheck his CD4 and VL and genotype. He has gotten vax. Offered condoms which he refuses and does not like being asked about. Will see him back in 3 months. His pain medications are refilled today- oxycontin 40mg  bid #60, oxycodone 10mg  q8prn #30

## 2011-05-26 LAB — CBC WITH DIFFERENTIAL/PLATELET
Basophils Absolute: 0 10*3/uL (ref 0.0–0.1)
HCT: 37.7 % — ABNORMAL LOW (ref 39.0–52.0)
Lymphocytes Relative: 21 % (ref 12–46)
Lymphs Abs: 1 10*3/uL (ref 0.7–4.0)
Monocytes Absolute: 0.3 10*3/uL (ref 0.1–1.0)
Neutro Abs: 3.4 10*3/uL (ref 1.7–7.7)
Platelets: 228 10*3/uL (ref 150–400)
RBC: 4.38 MIL/uL (ref 4.22–5.81)
RDW: 15.6 % — ABNORMAL HIGH (ref 11.5–15.5)
WBC: 4.8 10*3/uL (ref 4.0–10.5)

## 2011-05-26 LAB — BASIC METABOLIC PANEL
Calcium: 9.6 mg/dL (ref 8.4–10.5)
Glucose, Bld: 78 mg/dL (ref 70–99)
Potassium: 4.5 mEq/L (ref 3.5–5.3)
Sodium: 136 mEq/L (ref 135–145)

## 2011-05-27 LAB — T-HELPER CELL (CD4) - (RCID CLINIC ONLY)
CD4 % Helper T Cell: 10 % — ABNORMAL LOW (ref 33–55)
CD4 T Cell Abs: 110 uL — ABNORMAL LOW (ref 400–2700)

## 2011-05-27 LAB — HIV-1 RNA ULTRAQUANT REFLEX TO GENTYP+: HIV-1 RNA Quant, Log: 2.54 {Log} — ABNORMAL HIGH (ref <1.30)

## 2011-06-08 ENCOUNTER — Telehealth: Payer: Self-pay | Admitting: *Deleted

## 2011-06-08 NOTE — Telephone Encounter (Signed)
Pt has received a letter stating that his oxycontin will need prior approval in the future.  [employer] Medicare no longer covering this drug.  Pt gave all necessary information including phone number to start the processing of prior approval.  RN called [facility] Medicare and completed prior approval with representative.  [employer] will notify Center of decision.

## 2011-06-09 ENCOUNTER — Telehealth: Payer: Self-pay | Admitting: *Deleted

## 2011-06-09 NOTE — Telephone Encounter (Signed)
Too early for prior authorization for oxycontin.  Will need to be done prior to March 2013. Patient will bring copy of his letter from insurance at his appointment in January. Wendall Mola CMA

## 2011-06-23 ENCOUNTER — Telehealth: Payer: Self-pay | Admitting: *Deleted

## 2011-06-23 ENCOUNTER — Other Ambulatory Visit: Payer: Self-pay | Admitting: *Deleted

## 2011-06-23 NOTE — Telephone Encounter (Signed)
Received a fax from [employer] that as of 07/05/11 they will no longer cover pts oxycontin.  Dr. Ninetta Lights is changing Rx to morphine sulfate ER 40 mg twice daily.  Spoke with Jillyn Hidden and he said he received a letter that it will be covered until 09/02/11.  He is going to fax this to [place].  However, Dr. Ninetta Lights has changed this medication and patient is not happy with this decision. Wendall Mola CMA

## 2011-06-23 NOTE — Telephone Encounter (Signed)
Patient called back and he said he is willing to give the new Rx a chance.  Dr. Ninetta Lights is changing the oxycontin to morphine ER 40 mg, twice daily.  He will pick up the script 07/07/11. Wendall Mola CMA

## 2011-07-08 ENCOUNTER — Other Ambulatory Visit: Payer: Self-pay | Admitting: *Deleted

## 2011-07-08 DIAGNOSIS — G629 Polyneuropathy, unspecified: Secondary | ICD-10-CM

## 2011-07-08 MED ORDER — MORPHINE SULFATE ER 40 MG PO CP24: 40.0000 mg | ORAL_CAPSULE | Freq: Two times a day (BID) | ORAL | Status: DC

## 2011-07-11 ENCOUNTER — Telehealth: Payer: Self-pay

## 2011-07-11 NOTE — Telephone Encounter (Signed)
Authorization for fax sent to [facility].  at [phone]  Copy sent for scanning.  Message left on pt's voicemail hard copy ready for pick up.  Laurell Josephs, RN

## 2011-07-19 ENCOUNTER — Other Ambulatory Visit: Payer: Self-pay | Admitting: Licensed Clinical Social Worker

## 2011-07-19 DIAGNOSIS — B20 Human immunodeficiency virus [HIV] disease: Secondary | ICD-10-CM

## 2011-07-20 ENCOUNTER — Other Ambulatory Visit: Payer: Self-pay | Admitting: Infectious Diseases

## 2011-07-20 DIAGNOSIS — Z79899 Other long term (current) drug therapy: Secondary | ICD-10-CM

## 2011-07-20 DIAGNOSIS — Z113 Encounter for screening for infections with a predominantly sexual mode of transmission: Secondary | ICD-10-CM

## 2011-07-21 ENCOUNTER — Telehealth: Payer: Self-pay | Admitting: *Deleted

## 2011-07-21 ENCOUNTER — Other Ambulatory Visit (INDEPENDENT_AMBULATORY_CARE_PROVIDER_SITE_OTHER): Payer: PRIVATE HEALTH INSURANCE

## 2011-07-21 DIAGNOSIS — B20 Human immunodeficiency virus [HIV] disease: Secondary | ICD-10-CM

## 2011-07-21 DIAGNOSIS — Z79899 Other long term (current) drug therapy: Secondary | ICD-10-CM

## 2011-07-21 DIAGNOSIS — Z113 Encounter for screening for infections with a predominantly sexual mode of transmission: Secondary | ICD-10-CM

## 2011-07-21 LAB — CBC
HCT: 39 % (ref 39.0–52.0)
Hemoglobin: 12.7 g/dL — ABNORMAL LOW (ref 13.0–17.0)
MCH: 27.7 pg (ref 26.0–34.0)
MCHC: 32.6 g/dL (ref 30.0–36.0)
MCV: 85 fL (ref 78.0–100.0)

## 2011-07-21 LAB — LIPID PANEL
HDL: 32 mg/dL — ABNORMAL LOW (ref >39)
LDL Cholesterol: 93 mg/dL (ref 0–99)
Total CHOL/HDL Ratio: 4.4 Ratio
Triglycerides: 87 mg/dL (ref <150)
VLDL: 17 mg/dL (ref 0–40)

## 2011-07-21 LAB — COMPREHENSIVE METABOLIC PANEL
AST: 14 U/L (ref 0–37)
Alkaline Phosphatase: 115 U/L (ref 39–117)
BUN: 18 mg/dL (ref 6–23)
Creat: 1.2 mg/dL (ref 0.50–1.35)

## 2011-07-21 NOTE — Telephone Encounter (Signed)
Patient came in for labs and is requesting oxycodone 10 mg.  He said Dr. Ninetta Lights previously gave him this for breakthrough pain.  Records show he had oxycodone 5 mg one time.  He said the morphine is working, but he still has pain in his upper chest area radiating to his back. Advised him this may be cardiac related and he needed to be checked for this.  Will speak to Dr. Ninetta Lights and call [name] this afternoon. Wendall Mola CMA

## 2011-07-21 NOTE — Telephone Encounter (Signed)
Spoke with Dr. Ninetta Lights and he wants [name] to come in to be seen and have an EKG.  Left him a voicemail to call back and schedule an appointment for tomorrow morning. Wendall Mola CMA

## 2011-07-28 NOTE — Telephone Encounter (Signed)
Spoke with patient and he agreed to go to ED if he has an episode of chest pain.  He thinks he has back pain from sleeping on the couch.  Will cancel his appointment in February for labs, because he had them done in January.  His appointment with Dr. Ninetta Lights is 09/05/11. Wendall Mola CMA

## 2011-08-09 ENCOUNTER — Other Ambulatory Visit: Payer: Self-pay | Admitting: Licensed Clinical Social Worker

## 2011-08-09 ENCOUNTER — Other Ambulatory Visit: Payer: PRIVATE HEALTH INSURANCE

## 2011-08-09 DIAGNOSIS — G629 Polyneuropathy, unspecified: Secondary | ICD-10-CM

## 2011-08-09 MED ORDER — MORPHINE SULFATE ER 40 MG PO CP24: 40.0000 mg | ORAL_CAPSULE | Freq: Two times a day (BID) | ORAL | Status: DC

## 2011-08-18 ENCOUNTER — Telehealth: Payer: Self-pay | Admitting: *Deleted

## 2011-08-18 ENCOUNTER — Other Ambulatory Visit: Payer: Self-pay | Admitting: *Deleted

## 2011-08-18 DIAGNOSIS — B20 Human immunodeficiency virus [HIV] disease: Secondary | ICD-10-CM

## 2011-08-18 MED ORDER — EMTRICITAB-RILPIVIR-TENOFOV DF 200-25-300 MG PO TABS: 1.0000 | ORAL_TABLET | Freq: Every day | ORAL | Status: DC

## 2011-08-18 NOTE — Telephone Encounter (Signed)
Message left requesting pt call Center to reschedule lab work appt.

## 2011-08-23 ENCOUNTER — Other Ambulatory Visit: Payer: PRIVATE HEALTH INSURANCE

## 2011-08-25 ENCOUNTER — Other Ambulatory Visit: Payer: PRIVATE HEALTH INSURANCE

## 2011-09-05 ENCOUNTER — Telehealth: Payer: Self-pay | Admitting: *Deleted

## 2011-09-05 ENCOUNTER — Ambulatory Visit (INDEPENDENT_AMBULATORY_CARE_PROVIDER_SITE_OTHER): Payer: PRIVATE HEALTH INSURANCE | Admitting: Infectious Diseases

## 2011-09-05 ENCOUNTER — Encounter: Payer: Self-pay | Admitting: Infectious Diseases

## 2011-09-05 DIAGNOSIS — B20 Human immunodeficiency virus [HIV] disease: Secondary | ICD-10-CM

## 2011-09-05 DIAGNOSIS — G629 Polyneuropathy, unspecified: Secondary | ICD-10-CM

## 2011-09-05 DIAGNOSIS — Q8740 Marfan's syndrome, unspecified: Secondary | ICD-10-CM

## 2011-09-05 DIAGNOSIS — F17200 Nicotine dependence, unspecified, uncomplicated: Secondary | ICD-10-CM

## 2011-09-05 DIAGNOSIS — Z113 Encounter for screening for infections with a predominantly sexual mode of transmission: Secondary | ICD-10-CM

## 2011-09-05 DIAGNOSIS — Z79899 Other long term (current) drug therapy: Secondary | ICD-10-CM

## 2011-09-05 DIAGNOSIS — I872 Venous insufficiency (chronic) (peripheral): Secondary | ICD-10-CM

## 2011-09-05 MED ORDER — MORPHINE SULFATE ER 40 MG PO CP24: 40.0000 mg | ORAL_CAPSULE | Freq: Two times a day (BID) | ORAL | Status: DC

## 2011-09-05 MED ORDER — SULFAMETHOXAZOLE-TRIMETHOPRIM 400-80 MG PO TABS: 1.0000 | ORAL_TABLET | ORAL | Status: AC

## 2011-09-05 NOTE — Assessment & Plan Note (Signed)
He appears to be doing well. Will add bactrim TIW. He has incomplete immune reconstitution.

## 2011-09-05 NOTE — Assessment & Plan Note (Signed)
Will recheck his Echo.

## 2011-09-05 NOTE — Progress Notes (Signed)
  Subjective:    Patient ID: [name], male    DOB: 12/15/1960, 51 y.o.   MRN: [id number]  HPI 51 yo M with neuropathy, HIV/AIDS, DM that is diet controlled, suspected Marfan's syndrome, and COPD. CD4 90, VL 26 (07-21-2011).  He was written new ART in 2011 (TRV/ISN). He did not take this, was started on complera 2012, actually started to take in October 2012.   Feels well today. Has had some mild congestion, clear nasal d/c, no further CP, has cut down on his smoking to <1ppd, has been walking. No missed doses of ART. Urine has a bad odor, neon bright. has taken bactrim before, no problems taking it.       Review of Systems     Objective:   Physical Exam  Constitutional: He appears well-developed and well-nourished.  HENT:  Mouth/Throat: No oropharyngeal exudate.  Eyes: EOM are normal. Pupils are equal, round, and reactive to light.  Neck: Neck supple.  Cardiovascular: Normal rate and regular rhythm.   Murmur heard.  Systolic murmur is present with a grade of 4/6  Pulmonary/Chest: Effort normal and breath sounds normal.  Abdominal: Soft. Bowel sounds are normal. He exhibits no distension.  Musculoskeletal: He exhibits edema.  Lymphadenopathy:    He has no cervical adenopathy.          Assessment & Plan:

## 2011-09-05 NOTE — Assessment & Plan Note (Signed)
Has cut back, will encourage pt.

## 2011-09-05 NOTE — Telephone Encounter (Signed)
error 

## 2011-09-05 NOTE — Assessment & Plan Note (Signed)
Appears unchanged. 

## 2011-09-06 LAB — CBC WITH DIFFERENTIAL/PLATELET
Hemoglobin: 13 g/dL (ref 13.0–17.0)
Lymphocytes Relative: 20 % (ref 12–46)
Lymphs Abs: 1.1 10*3/uL (ref 0.7–4.0)
MCH: 28.3 pg (ref 26.0–34.0)
Monocytes Relative: 5 % (ref 3–12)
Neutrophils Relative %: 72 % (ref 43–77)
Platelets: 201 10*3/uL (ref 150–400)
RBC: 4.59 MIL/uL (ref 4.22–5.81)
WBC: 5.6 10*3/uL (ref 4.0–10.5)

## 2011-09-06 LAB — COMPREHENSIVE METABOLIC PANEL
ALT: 20 U/L (ref 0–53)
AST: 21 U/L (ref 0–37)
CO2: 22 mEq/L (ref 19–32)
Calcium: 9.9 mg/dL (ref 8.4–10.5)
Chloride: 102 mEq/L (ref 96–112)
Creat: 1.08 mg/dL (ref 0.50–1.35)
Total Bilirubin: 0.5 mg/dL (ref 0.3–1.2)
Total Protein: 8.2 g/dL (ref 6.0–8.3)

## 2011-09-06 LAB — T-HELPER CELL (CD4) - (RCID CLINIC ONLY)
CD4 % Helper T Cell: 8 % — ABNORMAL LOW (ref 33–55)
CD4 T Cell Abs: 90 uL — ABNORMAL LOW (ref 400–2700)

## 2011-09-06 LAB — RPR

## 2011-09-06 LAB — LIPID PANEL
Cholesterol: 134 mg/dL (ref 0–200)
Total CHOL/HDL Ratio: 3.9 Ratio
VLDL: 14 mg/dL (ref 0–40)

## 2011-09-07 LAB — HIV-1 RNA ULTRAQUANT REFLEX TO GENTYP+
HIV 1 RNA Quant: 34 copies/mL — ABNORMAL HIGH (ref <20)
HIV-1 RNA Quant, Log: 1.53 {Log} — ABNORMAL HIGH (ref <1.30)

## 2011-09-09 ENCOUNTER — Telehealth: Payer: Self-pay | Admitting: *Deleted

## 2011-09-09 NOTE — Telephone Encounter (Signed)
I gave him the VL & CD4 levels. He wanted to know about testosterone. I told him that was not done. Will discuss at next visit

## 2011-09-14 ENCOUNTER — Ambulatory Visit (HOSPITAL_COMMUNITY): Payer: PRIVATE HEALTH INSURANCE

## 2011-09-14 ENCOUNTER — Telehealth: Payer: Self-pay | Admitting: *Deleted

## 2011-09-14 NOTE — Telephone Encounter (Signed)
RN shared CD4, VL, CBC, and CMP results with the pt.

## 2011-09-19 ENCOUNTER — Other Ambulatory Visit: Payer: Self-pay | Admitting: *Deleted

## 2011-09-19 DIAGNOSIS — B20 Human immunodeficiency virus [HIV] disease: Secondary | ICD-10-CM

## 2011-09-19 MED ORDER — OMEPRAZOLE 40 MG PO CPDR: 40.0000 mg | DELAYED_RELEASE_CAPSULE | Freq: Every day | ORAL | Status: DC

## 2011-09-22 ENCOUNTER — Ambulatory Visit (HOSPITAL_COMMUNITY)
Admission: RE | Admit: 2011-09-22 | Discharge: 2011-09-22 | Disposition: A | Discharge status: Home / Self Care | Payer: PRIVATE HEALTH INSURANCE | Source: Ambulatory Visit | Attending: Infectious Diseases | Admitting: Infectious Diseases

## 2011-09-22 DIAGNOSIS — R0989 Other specified symptoms and signs involving the circulatory and respiratory systems: Secondary | ICD-10-CM | POA: Insufficient documentation

## 2011-09-22 DIAGNOSIS — J449 Chronic obstructive pulmonary disease, unspecified: Secondary | ICD-10-CM | POA: Insufficient documentation

## 2011-09-22 DIAGNOSIS — Q8740 Marfan's syndrome, unspecified: Secondary | ICD-10-CM | POA: Insufficient documentation

## 2011-09-22 DIAGNOSIS — E119 Type 2 diabetes mellitus without complications: Secondary | ICD-10-CM | POA: Insufficient documentation

## 2011-09-22 DIAGNOSIS — R0609 Other forms of dyspnea: Secondary | ICD-10-CM | POA: Insufficient documentation

## 2011-09-22 DIAGNOSIS — J4489 Other specified chronic obstructive pulmonary disease: Secondary | ICD-10-CM | POA: Insufficient documentation

## 2011-09-22 NOTE — Progress Notes (Signed)
  Echocardiogram 2D Echocardiogram has been performed.  Cathie Beams Deneen 09/22/2011, 12:33 PM

## 2011-10-04 ENCOUNTER — Other Ambulatory Visit: Payer: Self-pay | Admitting: Licensed Clinical Social Worker

## 2011-10-04 DIAGNOSIS — G629 Polyneuropathy, unspecified: Secondary | ICD-10-CM

## 2011-10-04 MED ORDER — MORPHINE SULFATE ER 40 MG PO CP24: 40.0000 mg | ORAL_CAPSULE | Freq: Two times a day (BID) | ORAL | Status: DC

## 2011-11-02 ENCOUNTER — Telehealth: Payer: Self-pay | Admitting: *Deleted

## 2011-11-02 DIAGNOSIS — B20 Human immunodeficiency virus [HIV] disease: Secondary | ICD-10-CM

## 2011-11-02 DIAGNOSIS — G629 Polyneuropathy, unspecified: Secondary | ICD-10-CM

## 2011-11-02 MED ORDER — SULFAMETHOXAZOLE-TMP DS 800-160 MG PO TABS: 1.0000 | ORAL_TABLET | ORAL | Status: DC

## 2011-11-02 MED ORDER — MORPHINE SULFATE ER 40 MG PO CP24: 40.0000 mg | ORAL_CAPSULE | Freq: Two times a day (BID) | ORAL | Status: DC

## 2011-11-02 NOTE — Telephone Encounter (Signed)
Please call pt when Morphine sulfate rx is signed so that he may pick-up.

## 2011-11-03 ENCOUNTER — Other Ambulatory Visit: Payer: Self-pay | Admitting: *Deleted

## 2011-11-03 NOTE — Telephone Encounter (Signed)
His sister came by to pick up his rx for morphine. She has done this before. I gave her the contract that he needs to sign & asked her to drop it off after he signs it. He does not have an appt here until July

## 2011-12-01 ENCOUNTER — Other Ambulatory Visit: Payer: Self-pay | Admitting: *Deleted

## 2011-12-01 DIAGNOSIS — G629 Polyneuropathy, unspecified: Secondary | ICD-10-CM

## 2011-12-01 MED ORDER — MORPHINE SULFATE ER 40 MG PO CP24: 40.0000 mg | ORAL_CAPSULE | Freq: Two times a day (BID) | ORAL | Status: DC

## 2011-12-19 ENCOUNTER — Other Ambulatory Visit: Payer: Self-pay | Admitting: Licensed Clinical Social Worker

## 2011-12-19 ENCOUNTER — Other Ambulatory Visit: Payer: PRIVATE HEALTH INSURANCE

## 2011-12-19 DIAGNOSIS — B20 Human immunodeficiency virus [HIV] disease: Secondary | ICD-10-CM

## 2011-12-19 LAB — CBC WITH DIFFERENTIAL/PLATELET
Basophils Relative: 1 % (ref 0–1)
Eosinophils Relative: 2 % (ref 0–5)
HCT: 37 % — ABNORMAL LOW (ref 39.0–52.0)
Lymphocytes Relative: 25 % (ref 12–46)
MCH: 27.1 pg (ref 26.0–34.0)
MCV: 81 fL (ref 78.0–100.0)
Monocytes Absolute: 0.2 10*3/uL (ref 0.1–1.0)
Platelets: 196 10*3/uL (ref 150–400)
RDW: 15.4 % (ref 11.5–15.5)
WBC: 4.3 10*3/uL (ref 4.0–10.5)

## 2011-12-19 LAB — COMPREHENSIVE METABOLIC PANEL
ALT: 21 U/L (ref 0–53)
AST: 21 U/L (ref 0–37)
Albumin: 4.1 g/dL (ref 3.5–5.2)
Alkaline Phosphatase: 131 U/L — ABNORMAL HIGH (ref 39–117)
Glucose, Bld: 93 mg/dL (ref 70–99)
Potassium: 4.4 mEq/L (ref 3.5–5.3)
Sodium: 137 mEq/L (ref 135–145)
Total Protein: 7.6 g/dL (ref 6.0–8.3)

## 2011-12-21 LAB — HIV-1 RNA QUANT-NO REFLEX-BLD: HIV-1 RNA Quant, Log: <1.3 {Log} (ref <1.30)

## 2012-01-02 ENCOUNTER — Ambulatory Visit (INDEPENDENT_AMBULATORY_CARE_PROVIDER_SITE_OTHER): Payer: PRIVATE HEALTH INSURANCE | Admitting: Infectious Diseases

## 2012-01-02 ENCOUNTER — Encounter: Payer: Self-pay | Admitting: Infectious Diseases

## 2012-01-02 VITALS — BP 131/87 | HR 103 | Temp 97.9°F | Wt 206.0 lb

## 2012-01-02 DIAGNOSIS — G629 Polyneuropathy, unspecified: Secondary | ICD-10-CM

## 2012-01-02 DIAGNOSIS — I872 Venous insufficiency (chronic) (peripheral): Secondary | ICD-10-CM

## 2012-01-02 DIAGNOSIS — G589 Mononeuropathy, unspecified: Secondary | ICD-10-CM

## 2012-01-02 DIAGNOSIS — B20 Human immunodeficiency virus [HIV] disease: Secondary | ICD-10-CM

## 2012-01-02 DIAGNOSIS — F17200 Nicotine dependence, unspecified, uncomplicated: Secondary | ICD-10-CM

## 2012-01-02 LAB — COMPREHENSIVE METABOLIC PANEL
BUN: 14 mg/dL (ref 6–23)
CO2: 23 mEq/L (ref 19–32)
Calcium: 9.2 mg/dL (ref 8.4–10.5)
Chloride: 102 mEq/L (ref 96–112)
Creat: 1.02 mg/dL (ref 0.50–1.35)
Glucose, Bld: 81 mg/dL (ref 70–99)
Total Bilirubin: 0.5 mg/dL (ref 0.3–1.2)

## 2012-01-02 LAB — CBC
HCT: 37.4 % — ABNORMAL LOW (ref 39.0–52.0)
Hemoglobin: 13 g/dL (ref 13.0–17.0)
MCH: 27.8 pg (ref 26.0–34.0)
MCV: 80.1 fL (ref 78.0–100.0)
RBC: 4.67 MIL/uL (ref 4.22–5.81)

## 2012-01-02 MED ORDER — MORPHINE SULFATE ER 40 MG PO CP24: 40.0000 mg | ORAL_CAPSULE | Freq: Two times a day (BID) | ORAL | Status: DC

## 2012-01-02 NOTE — Assessment & Plan Note (Signed)
Encouraged pt to wear compression hose.

## 2012-01-02 NOTE — Addendum Note (Signed)
Addended by: Mariea Clonts D on: 01/02/2012 02:15 PM   Modules accepted: Orders

## 2012-01-02 NOTE — Assessment & Plan Note (Signed)
Encouraged to quit. 

## 2012-01-02 NOTE — Assessment & Plan Note (Signed)
He has incomplete immune-reconstitution but is otherwise doing well. Will continue his current meds, he is taking well. Offered/refused condoms. vax up to date. rtc 4 months.

## 2012-01-02 NOTE — Progress Notes (Signed)
  Subjective:    Patient ID: SAMARION EHLE, male    DOB: 12-28-1960, 51 y.o.   MRN: [id number]  HPI 51 yo M with neuropathy, HIV/AIDS, DM that is diet controlled, suspected Marfan's syndrome, and COPD.  He was started on complera 2012, actually started to take in October 2012.  TTE 09-22-11- Grade I diastolic dysfunction.  Took bus this AM, was difficult due to "germ-o-phobia". Has been having nose bleeds, had cut on his L hand this am, difficulty with clotting. Took ibuprofen 10 days ago.  Not getting along with sister now, "tired of being used by her". "I told her she needs rehab bad". Wants to re-do his will.  Has been doing well with pain rx. wants refill today.  HIV 1 RNA Quant (copies/mL)  Date Value  12/19/2011 <20   09/05/2011 34*  07/21/2011 26*     CD4 T Cell Abs (cmm)  Date Value  12/19/2011 80*  09/05/2011 90*  07/21/2011 90*      Review of Systems     Objective:   Physical Exam  Constitutional: He appears well-developed and well-nourished.  HENT:  Mouth/Throat: No oropharyngeal exudate.  Eyes: EOM are normal. Pupils are equal, round, and reactive to light.  Neck: Neck supple.  Cardiovascular: Normal rate and regular rhythm.   Murmur heard. Pulmonary/Chest: Effort normal and breath sounds normal.  Abdominal: Soft. Bowel sounds are normal. He exhibits no distension. There is no tenderness.  Musculoskeletal: He exhibits edema.       >3+ edema, varicose veins.   Lymphadenopathy:    He has no cervical adenopathy.          Assessment & Plan:

## 2012-01-03 LAB — T-HELPER CELL (CD4) - (RCID CLINIC ONLY): CD4 T Cell Abs: 110 uL — ABNORMAL LOW (ref 400–2700)

## 2012-01-04 LAB — HIV-1 RNA QUANT-NO REFLEX-BLD: HIV-1 RNA Quant, Log: <1.3 {Log} (ref <1.30)

## 2012-01-18 ENCOUNTER — Other Ambulatory Visit: Payer: Self-pay | Admitting: *Deleted

## 2012-01-18 DIAGNOSIS — E8779 Other fluid overload: Secondary | ICD-10-CM

## 2012-01-18 MED ORDER — FUROSEMIDE 40 MG PO TABS: 20.0000 mg | ORAL_TABLET | Freq: Every day | ORAL | Status: DC

## 2012-01-31 ENCOUNTER — Other Ambulatory Visit: Payer: Self-pay | Admitting: *Deleted

## 2012-01-31 DIAGNOSIS — G629 Polyneuropathy, unspecified: Secondary | ICD-10-CM

## 2012-01-31 MED ORDER — MORPHINE SULFATE ER 40 MG PO CP24: 40.0000 mg | ORAL_CAPSULE | Freq: Two times a day (BID) | ORAL | Status: DC

## 2012-01-31 NOTE — Telephone Encounter (Signed)
RX printed and placed in Dr. Hatcher's box for signature. 

## 2012-02-28 ENCOUNTER — Other Ambulatory Visit: Payer: Self-pay | Admitting: Licensed Clinical Social Worker

## 2012-02-28 DIAGNOSIS — G629 Polyneuropathy, unspecified: Secondary | ICD-10-CM

## 2012-02-28 MED ORDER — MORPHINE SULFATE ER 40 MG PO CP24: 40.0000 mg | ORAL_CAPSULE | Freq: Two times a day (BID) | ORAL | Status: DC

## 2012-03-19 ENCOUNTER — Other Ambulatory Visit: Payer: Self-pay | Admitting: Infectious Diseases

## 2012-03-28 ENCOUNTER — Other Ambulatory Visit: Payer: Self-pay | Admitting: *Deleted

## 2012-03-28 DIAGNOSIS — G629 Polyneuropathy, unspecified: Secondary | ICD-10-CM

## 2012-03-28 MED ORDER — MORPHINE SULFATE ER 40 MG PO CP24: 40.0000 mg | ORAL_CAPSULE | Freq: Two times a day (BID) | ORAL | Status: DC

## 2012-03-28 NOTE — Telephone Encounter (Signed)
Pt requesting to pick up Friday, Sept. 27, 2013.  Given to Dr. Drue Second to sign rx.

## 2012-03-29 ENCOUNTER — Other Ambulatory Visit: Payer: Self-pay | Admitting: Licensed Clinical Social Worker

## 2012-03-29 DIAGNOSIS — B20 Human immunodeficiency virus [HIV] disease: Secondary | ICD-10-CM

## 2012-03-29 MED ORDER — SULFAMETHOXAZOLE-TMP DS 800-160 MG PO TABS: 1.0000 | ORAL_TABLET | ORAL | Status: DC

## 2012-03-30 ENCOUNTER — Ambulatory Visit (INDEPENDENT_AMBULATORY_CARE_PROVIDER_SITE_OTHER): Payer: PRIVATE HEALTH INSURANCE

## 2012-03-30 DIAGNOSIS — Z23 Encounter for immunization: Secondary | ICD-10-CM

## 2012-04-23 ENCOUNTER — Other Ambulatory Visit: Payer: Self-pay | Admitting: Infectious Diseases

## 2012-04-23 ENCOUNTER — Other Ambulatory Visit: Payer: PRIVATE HEALTH INSURANCE

## 2012-04-23 DIAGNOSIS — B20 Human immunodeficiency virus [HIV] disease: Secondary | ICD-10-CM

## 2012-04-24 LAB — T-HELPER CELL (CD4) - (RCID CLINIC ONLY)
CD4 % Helper T Cell: 8 % — ABNORMAL LOW (ref 33–55)
CD4 T Cell Abs: 80 uL — ABNORMAL LOW (ref 400–2700)

## 2012-04-25 ENCOUNTER — Other Ambulatory Visit: Payer: Self-pay | Admitting: Infectious Diseases

## 2012-04-25 DIAGNOSIS — G629 Polyneuropathy, unspecified: Secondary | ICD-10-CM

## 2012-04-25 MED ORDER — MORPHINE SULFATE ER 40 MG PO CP24: 40.0000 mg | ORAL_CAPSULE | Freq: Two times a day (BID) | ORAL | Status: DC

## 2012-05-07 ENCOUNTER — Other Ambulatory Visit: Payer: Self-pay | Admitting: *Deleted

## 2012-05-07 ENCOUNTER — Encounter: Payer: Self-pay | Admitting: Infectious Diseases

## 2012-05-07 ENCOUNTER — Ambulatory Visit (INDEPENDENT_AMBULATORY_CARE_PROVIDER_SITE_OTHER): Payer: PRIVATE HEALTH INSURANCE | Admitting: Infectious Diseases

## 2012-05-07 VITALS — BP 120/76 | HR 94 | Temp 97.4°F | Ht 75.0 in | Wt 202.0 lb

## 2012-05-07 DIAGNOSIS — Z113 Encounter for screening for infections with a predominantly sexual mode of transmission: Secondary | ICD-10-CM

## 2012-05-07 DIAGNOSIS — K219 Gastro-esophageal reflux disease without esophagitis: Secondary | ICD-10-CM

## 2012-05-07 DIAGNOSIS — B20 Human immunodeficiency virus [HIV] disease: Secondary | ICD-10-CM

## 2012-05-07 DIAGNOSIS — F17200 Nicotine dependence, unspecified, uncomplicated: Secondary | ICD-10-CM

## 2012-05-07 DIAGNOSIS — I872 Venous insufficiency (chronic) (peripheral): Secondary | ICD-10-CM

## 2012-05-07 DIAGNOSIS — Z79899 Other long term (current) drug therapy: Secondary | ICD-10-CM

## 2012-05-07 MED ORDER — PROMETHAZINE HCL 25 MG PO TABS: 25.0000 mg | ORAL_TABLET | Freq: Three times a day (TID) | ORAL | Status: DC | PRN

## 2012-05-07 NOTE — Assessment & Plan Note (Signed)
Had episodes of food, pills going up his nose when he swalllows. On his exam, there is no abnormality noted.

## 2012-05-07 NOTE — Assessment & Plan Note (Signed)
Encouraged pt to wear compression stockings.

## 2012-05-07 NOTE — Assessment & Plan Note (Signed)
Encourage pt to quit

## 2012-05-07 NOTE — Progress Notes (Signed)
  Subjective:    Patient ID: [name], male    DOB: 1961-03-05, 51 y.o.   MRN: [id number]  HPI 51 yo M with neuropathy, HIV/AIDS, DM that is diet controlled, suspected Marfan's syndrome, and COPD.  He was started on complera 2012.  TTE 09-22-11- Grade I diastolic dysfunction.  HIV 1 RNA Quant (copies/mL)  Date Value  04/23/2012 <20   01/02/2012 <20   12/19/2011 <20      CD4 T Cell Abs (cmm)  Date Value  04/23/2012 80*  01/02/2012 110*  12/19/2011 80*   Feels good, has a good appetite, sleeping well. Wt [place] up to 202. Mild nausea. Has run out of phenergan, mild after he eats.  LE edema is unchanged.  Falls asleep in recliner with his legs down.  Still smoking, has cut back. Walking in the afternoons.   Review of Systems     Objective:   Physical Exam  Constitutional: He appears well-developed and well-nourished.  HENT:  Mouth/Throat: Uvula is midline and mucous membranes are normal. No oral lesions. No oropharyngeal exudate.  Eyes: EOM are normal. Pupils are equal, round, and reactive to light.  Neck: Neck supple.  Cardiovascular: Normal rate, regular rhythm and normal heart sounds.   Pulmonary/Chest: Effort normal and breath sounds normal.  Abdominal: Soft. Bowel sounds are normal. There is no tenderness.  Musculoskeletal: He exhibits edema.  Lymphadenopathy:    He has no cervical adenopathy.          Assessment & Plan:

## 2012-05-07 NOTE — Assessment & Plan Note (Signed)
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 Will continue his complera. Refill his phenergan. Has had flu shot. Offered/refused condoms (not actitve). See him back in 6 months.

## 2012-05-10 ENCOUNTER — Telehealth: Payer: Self-pay | Admitting: *Deleted

## 2012-05-10 NOTE — Telephone Encounter (Signed)
Schedule patient for colonoscopy in early January, [facility] schedule not up yet. [name]

## 2012-05-25 ENCOUNTER — Other Ambulatory Visit: Payer: Self-pay | Admitting: *Deleted

## 2012-05-25 DIAGNOSIS — G629 Polyneuropathy, unspecified: Secondary | ICD-10-CM

## 2012-05-25 MED ORDER — MORPHINE SULFATE ER 40 MG PO CP24: 40.0000 mg | ORAL_CAPSULE | Freq: Two times a day (BID) | ORAL | Status: DC

## 2012-06-21 ENCOUNTER — Telehealth: Payer: Self-pay

## 2012-06-21 DIAGNOSIS — G629 Polyneuropathy, unspecified: Secondary | ICD-10-CM

## 2012-06-21 MED ORDER — MORPHINE SULFATE ER 40 MG PO CP24: 40.0000 mg | ORAL_CAPSULE | Freq: Two times a day (BID) | ORAL | Status: DC

## 2012-07-02 ENCOUNTER — Encounter (HOSPITAL_COMMUNITY): Payer: Self-pay | Admitting: *Deleted

## 2012-07-02 ENCOUNTER — Emergency Department (HOSPITAL_COMMUNITY)
Admission: EM | Admit: 2012-07-02 | Discharge: 2012-07-02 | Disposition: A | Discharge status: Home / Self Care | Payer: PRIVATE HEALTH INSURANCE | Attending: Emergency Medicine | Admitting: Emergency Medicine

## 2012-07-02 DIAGNOSIS — B181 Chronic viral hepatitis B without delta-agent: Secondary | ICD-10-CM | POA: Insufficient documentation

## 2012-07-02 DIAGNOSIS — Y93E8 Activity, other personal hygiene: Secondary | ICD-10-CM | POA: Insufficient documentation

## 2012-07-02 DIAGNOSIS — Y33XXXA Other specified events, undetermined intent, initial encounter: Secondary | ICD-10-CM | POA: Insufficient documentation

## 2012-07-02 DIAGNOSIS — Z9089 Acquired absence of other organs: Secondary | ICD-10-CM | POA: Insufficient documentation

## 2012-07-02 DIAGNOSIS — F329 Major depressive disorder, single episode, unspecified: Secondary | ICD-10-CM | POA: Insufficient documentation

## 2012-07-02 DIAGNOSIS — F17200 Nicotine dependence, unspecified, uncomplicated: Secondary | ICD-10-CM | POA: Insufficient documentation

## 2012-07-02 DIAGNOSIS — Y929 Unspecified place or not applicable: Secondary | ICD-10-CM | POA: Insufficient documentation

## 2012-07-02 DIAGNOSIS — Z87898 Personal history of other specified conditions: Secondary | ICD-10-CM | POA: Insufficient documentation

## 2012-07-02 DIAGNOSIS — Z79899 Other long term (current) drug therapy: Secondary | ICD-10-CM | POA: Insufficient documentation

## 2012-07-02 DIAGNOSIS — S0500XA Injury of conjunctiva and corneal abrasion without foreign body, unspecified eye, initial encounter: Secondary | ICD-10-CM

## 2012-07-02 DIAGNOSIS — F3289 Other specified depressive episodes: Secondary | ICD-10-CM | POA: Insufficient documentation

## 2012-07-02 DIAGNOSIS — H7190 Unspecified cholesteatoma, unspecified ear: Secondary | ICD-10-CM | POA: Insufficient documentation

## 2012-07-02 DIAGNOSIS — B20 Human immunodeficiency virus [HIV] disease: Secondary | ICD-10-CM | POA: Insufficient documentation

## 2012-07-02 DIAGNOSIS — S058X9A Other injuries of unspecified eye and orbit, initial encounter: Secondary | ICD-10-CM | POA: Insufficient documentation

## 2012-07-02 DIAGNOSIS — Z9889 Other specified postprocedural states: Secondary | ICD-10-CM | POA: Insufficient documentation

## 2012-07-02 MED ORDER — ERYTHROMYCIN 5 MG/GM OP OINT: TOPICAL_OINTMENT | OPHTHALMIC | Status: DC

## 2012-07-02 MED ORDER — TETRACAINE HCL 0.5 % OP SOLN
1.0000 [drp] | Freq: Once | OPHTHALMIC | Status: DC
  Filled 2012-07-02: qty 2

## 2012-07-02 MED ORDER — HYDROCODONE-ACETAMINOPHEN 5-325 MG PO TABS: 2.0000 | ORAL_TABLET | Freq: Four times a day (QID) | ORAL | Status: DC | PRN

## 2012-07-02 NOTE — ED Notes (Signed)
Pt c/o right eye sore and draining x 1 day; blurry; also concerned about area of "crustiness" to left ear that may possibly be a "parasite"

## 2012-07-03 NOTE — ED Provider Notes (Signed)
Medical screening examination/treatment/procedure(s) were performed by non-physician practitioner and as supervising physician I was immediately available for consultation/collaboration.  Gerhard Munch, MD 07/03/12 [phone]

## 2012-07-03 NOTE — ED Provider Notes (Signed)
History     CSN: [id number]  Arrival date & time 07/02/12  4098   First MD Initiated Contact with Patient 07/02/12 2037      No chief complaint on file.   (Consider location/radiation/quality/duration/timing/severity/associated sxs/prior treatment) HPI Comments: This is a 51 year old male, who presents emergency department with chief complaint of right eye pain. Patient states that his eye has been hurting him since last night. States that this morning when he awakened, there was a mild amount of eye discharge. Patient states that last night while watching his face he might have scraped his eye with a brush. He states that his pain is moderate to severe. He has not tried anything to alleviate his symptoms.  The history is provided by the patient. No language interpreter was used.    Past Medical History  Diagnosis Date  . Chronic hepatitis B   . HIV (human immunodeficiency virus infection)   . Cholesteatoma   . Depression     Past Surgical History  Procedure Date  . Cholesteotoma removal   . Appendectomy   . Repair of perforated colon     No family history on file.  History  Substance Use Topics  . Smoking status: Current Every Day Smoker -- 1.0 packs/day for 35 years    Types: Cigarettes  . Smokeless tobacco: Never Used     Comment: no start date yet  . Alcohol Use: No      Review of Systems  All other systems reviewed and are negative.    Allergies  Tetracycline hcl  Home Medications   Current Outpatient Rx  Name  Route  Sig  Dispense  Refill  . ALPRAZOLAM 0.5 MG PO TABS   Oral   Take 0.5 mg by mouth every 8 (eight) hours.           Marland Kitchen CLONAZEPAM 2 MG PO TABS   Oral   Take 1 mg by mouth at bedtime as needed.          Marland Kitchen COENZYME Q10 30 MG PO CAPS   Oral   Take 100 mg by mouth daily.         . COMPLERA 200-25-300 MG PO TABS      TAKE 1 TABLET BY MOUTH DAILY.   30 tablet   6   . FUROSEMIDE 40 MG PO TABS   Oral   Take 0.5 tablets (20  mg total) by mouth daily.   30 tablet   6   . GRAPE SEED EXTRACT 100 MG PO CAPS   Oral   Take 1 capsule by mouth daily.          Marland Kitchen GREEN TEA (CAMILLIA SINENSIS) 250 MG PO CAPS   Oral   Take 1 capsule by mouth.          . MORPHINE SULFATE ER 40 MG PO CP24   Oral   Take 40 mg by mouth 2 (two) times daily.   60 capsule   0   . ONE-DAILY MULTI VITAMINS PO TABS   Oral   Take 1 tablet by mouth daily.         Marland Kitchen NORTRIPTYLINE HCL 75 MG PO CAPS   Oral   Take 150 mg by mouth at bedtime.         . OMEPRAZOLE 40 MG PO CPDR   Oral   Take 1 capsule (40 mg total) by mouth daily.   30 capsule   11   . PROMETHAZINE HCL 25 MG  PO TABS   Oral   Take 25 mg by mouth every 8 (eight) hours as needed. For nausea.         Marland Kitchen SPIRONOLACTONE 25 MG PO TABS      TAKE 1 TABLET BY MOUTH DAILY.   30 tablet   PRN   . ERYTHROMYCIN 5 MG/GM OP OINT      Place a 1/2 inch ribbon of ointment into the lower eyelid.   1 g   0   . FREESTYLE LITE TEST VI STRP      USE AS DIRECTED   32 each   6     **Patient requests 90 day supply**   . HYDROCODONE-ACETAMINOPHEN 5-325 MG PO TABS   Oral   Take 2 tablets by mouth every 6 (six) hours as needed for pain.   12 tablet   0     BP 121/79  Pulse 94  Temp 98.9 F (37.2 C) (Oral)  Resp 20  Ht 6\' 2"  (1.88 m)  Wt 202 lb (91.627 kg)  BMI 25.94 kg/m2  SpO2 97%  Physical Exam  Nursing note and vitals reviewed. Constitutional: He is oriented to person, place, and time. He appears well-developed and well-nourished.  HENT:  Head: Normocephalic and atraumatic.  Eyes: EOM are normal. Pupils are equal, round, and reactive to light. Right eye exhibits no discharge. Left eye exhibits no discharge. No scleral icterus.         Corneal abrasion, right eye conjunctiva red and inflamed.  Neck: Normal range of motion.  Cardiovascular: Normal rate.   Pulmonary/Chest: Effort normal.  Abdominal: He exhibits no distension.  Musculoskeletal: Normal  range of motion.  Neurological: He is alert and oriented to person, place, and time.  Skin: Skin is dry.  Psychiatric: He has a normal mood and affect. His behavior is normal. Judgment and thought content normal.    ED Course  Procedures (including critical care time)  Labs Reviewed - No data to display No results found.   1. Corneal abrasion       MDM  51 year old male with corneal abrasion. Will treat the patient with Romycin ophthalmic ointment pain medicine. Return precautions have been given. Followup encouraged with ophthalmology. Patient understands and is stable and ready for discharge.       Roxy Horseman, PA-C 07/03/12 [phone]

## 2012-07-05 ENCOUNTER — Encounter: Payer: Self-pay | Admitting: Internal Medicine

## 2012-07-05 ENCOUNTER — Telehealth: Payer: Self-pay | Admitting: *Deleted

## 2012-07-05 NOTE — Telephone Encounter (Signed)
Called patient and left voice mail to return my call regarding his GI appt.  It is scheduled for 08/10/12 at 10:00 AM at [facility] for the colonoscopy procedure. His nurse visit is 07/27/12 at 10:30 AM. Wendall Mola CMA

## 2012-07-16 ENCOUNTER — Emergency Department (HOSPITAL_COMMUNITY)
Admission: EM | Admit: 2012-07-16 | Discharge: 2012-07-16 | Disposition: A | Discharge status: Home / Self Care | Payer: PRIVATE HEALTH INSURANCE | Attending: Emergency Medicine | Admitting: Emergency Medicine

## 2012-07-16 ENCOUNTER — Encounter (HOSPITAL_COMMUNITY): Payer: Self-pay | Admitting: *Deleted

## 2012-07-16 DIAGNOSIS — F17200 Nicotine dependence, unspecified, uncomplicated: Secondary | ICD-10-CM | POA: Insufficient documentation

## 2012-07-16 DIAGNOSIS — Z8669 Personal history of other diseases of the nervous system and sense organs: Secondary | ICD-10-CM | POA: Insufficient documentation

## 2012-07-16 DIAGNOSIS — Z79899 Other long term (current) drug therapy: Secondary | ICD-10-CM | POA: Insufficient documentation

## 2012-07-16 DIAGNOSIS — Z23 Encounter for immunization: Secondary | ICD-10-CM | POA: Insufficient documentation

## 2012-07-16 DIAGNOSIS — F329 Major depressive disorder, single episode, unspecified: Secondary | ICD-10-CM | POA: Insufficient documentation

## 2012-07-16 DIAGNOSIS — Z21 Asymptomatic human immunodeficiency virus [HIV] infection status: Secondary | ICD-10-CM | POA: Insufficient documentation

## 2012-07-16 DIAGNOSIS — Y9389 Activity, other specified: Secondary | ICD-10-CM | POA: Insufficient documentation

## 2012-07-16 DIAGNOSIS — S61209A Unspecified open wound of unspecified finger without damage to nail, initial encounter: Secondary | ICD-10-CM | POA: Insufficient documentation

## 2012-07-16 DIAGNOSIS — S61219A Laceration without foreign body of unspecified finger without damage to nail, initial encounter: Secondary | ICD-10-CM

## 2012-07-16 DIAGNOSIS — Y929 Unspecified place or not applicable: Secondary | ICD-10-CM | POA: Insufficient documentation

## 2012-07-16 DIAGNOSIS — W268XXA Contact with other sharp object(s), not elsewhere classified, initial encounter: Secondary | ICD-10-CM | POA: Insufficient documentation

## 2012-07-16 DIAGNOSIS — Z8619 Personal history of other infectious and parasitic diseases: Secondary | ICD-10-CM | POA: Insufficient documentation

## 2012-07-16 DIAGNOSIS — F3289 Other specified depressive episodes: Secondary | ICD-10-CM | POA: Insufficient documentation

## 2012-07-16 MED ORDER — TETANUS-DIPHTH-ACELL PERTUSSIS 5-2.5-18.5 LF-MCG/0.5 IM SUSP
0.5000 mL | Freq: Once | INTRAMUSCULAR | Status: AC
  Administered 2012-07-16: 0.5 mL via INTRAMUSCULAR
  Filled 2012-07-16: qty 0.5

## 2012-07-16 NOTE — ED Provider Notes (Signed)
History     CSN: [id number]  Arrival date & time 07/16/12  1107   First MD Initiated Contact with Patient 07/16/12 1156      Chief Complaint  Patient presents with  . Finger Injury    (Consider location/radiation/quality/duration/timing/severity/associated sxs/prior treatment) HPI Comments: 52 year old male presents emergency department complaining of a laceration to his right index finger while he was cleaning a window back splash. States he cut it on a metal part of the window. Denies coming in on any glass. Describes the pain as throbbing, rated 4/10. Denies numbness in his finger. No lightheadedness or dizziness. Last tetanus shot was around 2007 or 2008.  The history is provided by the patient.    Past Medical History  Diagnosis Date  . Chronic hepatitis B   . HIV (human immunodeficiency virus infection)   . Cholesteatoma   . Depression     Past Surgical History  Procedure Date  . Cholesteotoma removal   . Appendectomy   . Repair of perforated colon     No family history on file.  History  Substance Use Topics  . Smoking status: Current Every Day Smoker -- 1.0 packs/day for 35 years    Types: Cigarettes  . Smokeless tobacco: Never Used     Comment: no start date yet  . Alcohol Use: No      Review of Systems  Constitutional: Negative.   Musculoskeletal: Negative.   Skin: Positive for wound.  Neurological: Negative for dizziness, light-headedness and numbness.    Allergies  Tetracycline hcl  Home Medications   Current Outpatient Rx  Name  Route  Sig  Dispense  Refill  . ALPRAZOLAM 0.5 MG PO TABS   Oral   Take 0.5 mg by mouth every 8 (eight) hours.           . AVOCADO OIL OIL   Oral   Take 15 mLs by mouth at bedtime.         Marland Kitchen CLONAZEPAM 2 MG PO TABS   Oral   Take 1 mg by mouth at bedtime as needed.          Marland Kitchen COENZYME Q10 30 MG PO CAPS   Oral   Take 100 mg by mouth daily.         . COCONUT OIL PO   Oral   Take 15 mLs by mouth  at bedtime.         . COMPLERA 200-25-300 MG PO TABS      TAKE 1 TABLET BY MOUTH DAILY.   30 tablet   6   . ERYTHROMYCIN 5 MG/GM OP OINT      Place a 1/2 inch ribbon of ointment into the lower eyelid.   1 g   0   . FLAX SEED OIL PO   Oral   Take 15 mLs by mouth at bedtime.         Marland Kitchen FREESTYLE LITE TEST VI STRP      USE AS DIRECTED   32 each   6     **Patient requests 90 day supply**   . FUROSEMIDE 40 MG PO TABS   Oral   Take 0.5 tablets (20 mg total) by mouth daily.   30 tablet   6   . GRAPE SEED EXTRACT 100 MG PO CAPS   Oral   Take 1 capsule by mouth daily.          Marland Kitchen GREEN TEA (CAMILLIA SINENSIS) 250 MG PO CAPS  Oral   Take 1 capsule by mouth.          Marland Kitchen HYDROCODONE-ACETAMINOPHEN 5-325 MG PO TABS   Oral   Take 2 tablets by mouth every 6 (six) hours as needed for pain.   12 tablet   0   . MORPHINE SULFATE ER 40 MG PO CP24   Oral   Take 40 mg by mouth 2 (two) times daily.   60 capsule   0   . ONE-DAILY MULTI VITAMINS PO TABS   Oral   Take 1 tablet by mouth daily.         Marland Kitchen NORTRIPTYLINE HCL 75 MG PO CAPS   Oral   Take 150 mg by mouth at bedtime.         . OMEPRAZOLE 40 MG PO CPDR   Oral   Take 1 capsule (40 mg total) by mouth daily.   30 capsule   11   . PROMETHAZINE HCL 25 MG PO TABS   Oral   Take 25 mg by mouth every 8 (eight) hours as needed. For nausea.         Marland Kitchen SPIRONOLACTONE 25 MG PO TABS      TAKE 1 TABLET BY MOUTH DAILY.   30 tablet   PRN     BP 117/69  Pulse 110  Temp 98.5 F (36.9 C) (Oral)  Resp 20  SpO2 96%  Physical Exam  Constitutional: He is oriented to person, place, and time. He appears well-developed and well-nourished. No distress.  HENT:  Head: Normocephalic and atraumatic.  Mouth/Throat: Oropharynx is clear and moist.  Eyes: Conjunctivae normal and EOM are normal. Pupils are equal, round, and reactive to light.  Neck: Normal range of motion. Neck supple.  Cardiovascular: Normal rate,  regular rhythm, normal heart sounds and intact distal pulses.   Pulmonary/Chest: Effort normal and breath sounds normal.  Musculoskeletal: Normal range of motion. He exhibits no edema.       Right hand: He exhibits laceration (1 cm laceration to the distal tip of second digit. curently bleeding). He exhibits normal range of motion and normal capillary refill. normal sensation noted. Normal strength noted.       No evidence of tendinous disruption.  Neurological: He is alert and oriented to person, place, and time. He has normal strength. No sensory deficit.  Skin: Skin is warm and dry. Laceration noted.  Psychiatric: He has a normal mood and affect. His behavior is normal.    ED Course  Procedures (including critical care time) LACERATION REPAIR Performed by: Johnnette Gourd Authorized by: Johnnette Gourd Consent: Verbal consent obtained. Risks and benefits: risks, benefits and alternatives were discussed Consent given by: patient Patient identity confirmed: provided demographic data Prepped and Draped in normal sterile fashion Wound explored  Laceration Location: distal aspect of second digit  Laceration Length: 1 cm  No Foreign Bodies seen or palpated  Anesthesia: finger nerve block  Anesthetic: lidocaine 2% without epinephrine  Anesthetic total: 5 ml  Irrigation method: syringe Amount of cleaning: standard  Skin closure: 5-0 prolene  Number of sutures: 2  Technique: simple interrupted  Patient tolerance: Patient tolerated the procedure well with no immediate complications.  Labs Reviewed - No data to display No results found.   1. Finger laceration       MDM  52 y/o male with laceration to tip of index finger. 2 sutures placed. Bleeding now controlled. No FB seen or palpated. No glass involved in injury. Tdap given. Return precautions  discussed along with laceration care. Patient states understanding of plan and is agreeable.         Trevor Mace,  PA-C 07/16/12 1329  Trevor Mace, PA-C 07/16/12 1347

## 2012-07-16 NOTE — ED Notes (Signed)
Pt reports he was cleaning a window back splash, cut his R index finger.  Actively bleeding.  Gauze placed on lac.  Pt holding pressure

## 2012-07-19 NOTE — ED Provider Notes (Signed)
Medical screening examination/treatment/procedure(s) were performed by non-physician practitioner and as supervising physician I was immediately available for consultation/collaboration.  Kirk Basquez T Taylor Levick, MD 07/19/12 0753 

## 2012-07-25 ENCOUNTER — Other Ambulatory Visit: Payer: Self-pay | Admitting: *Deleted

## 2012-07-25 DIAGNOSIS — G629 Polyneuropathy, unspecified: Secondary | ICD-10-CM

## 2012-07-25 MED ORDER — MORPHINE SULFATE ER 40 MG PO CP24: 40.0000 mg | ORAL_CAPSULE | Freq: Two times a day (BID) | ORAL | Status: DC

## 2012-08-08 ENCOUNTER — Other Ambulatory Visit: Payer: PRIVATE HEALTH INSURANCE | Admitting: Gastroenterology

## 2012-08-10 ENCOUNTER — Encounter: Payer: PRIVATE HEALTH INSURANCE | Admitting: Internal Medicine

## 2012-08-22 ENCOUNTER — Other Ambulatory Visit: Payer: Self-pay | Admitting: *Deleted

## 2012-08-22 DIAGNOSIS — G629 Polyneuropathy, unspecified: Secondary | ICD-10-CM

## 2012-08-22 MED ORDER — MORPHINE SULFATE ER 40 MG PO CP24: 40.0000 mg | ORAL_CAPSULE | Freq: Two times a day (BID) | ORAL | Status: DC

## 2012-09-06 ENCOUNTER — Telehealth: Payer: Self-pay | Admitting: Neurology

## 2012-09-18 ENCOUNTER — Other Ambulatory Visit: Payer: Self-pay | Admitting: Licensed Clinical Social Worker

## 2012-09-18 DIAGNOSIS — G629 Polyneuropathy, unspecified: Secondary | ICD-10-CM

## 2012-09-18 MED ORDER — MORPHINE SULFATE ER 40 MG PO CP24: 40.0000 mg | ORAL_CAPSULE | Freq: Two times a day (BID) | ORAL | Status: DC

## 2012-09-24 ENCOUNTER — Other Ambulatory Visit: Payer: Self-pay | Admitting: *Deleted

## 2012-09-24 DIAGNOSIS — B20 Human immunodeficiency virus [HIV] disease: Secondary | ICD-10-CM

## 2012-09-24 MED ORDER — OMEPRAZOLE 40 MG PO CPDR: 40.0000 mg | DELAYED_RELEASE_CAPSULE | Freq: Every day | ORAL | Status: DC

## 2012-10-02 ENCOUNTER — Other Ambulatory Visit: Payer: Self-pay | Admitting: Infectious Diseases

## 2012-10-17 ENCOUNTER — Other Ambulatory Visit: Payer: Self-pay | Admitting: *Deleted

## 2012-10-17 DIAGNOSIS — G629 Polyneuropathy, unspecified: Secondary | ICD-10-CM

## 2012-10-17 MED ORDER — MORPHINE SULFATE ER 40 MG PO CP24: 40.0000 mg | ORAL_CAPSULE | Freq: Two times a day (BID) | ORAL | Status: DC

## 2012-10-31 ENCOUNTER — Other Ambulatory Visit: Payer: Self-pay | Admitting: Infectious Diseases

## 2012-11-13 ENCOUNTER — Other Ambulatory Visit: Payer: Self-pay | Admitting: *Deleted

## 2012-11-13 DIAGNOSIS — G629 Polyneuropathy, unspecified: Secondary | ICD-10-CM

## 2012-11-13 MED ORDER — MORPHINE SULFATE ER 40 MG PO CP24: 40.0000 mg | ORAL_CAPSULE | Freq: Two times a day (BID) | ORAL | Status: DC

## 2012-11-14 ENCOUNTER — Other Ambulatory Visit: Payer: Self-pay | Admitting: Infectious Diseases

## 2012-11-14 ENCOUNTER — Other Ambulatory Visit: Payer: PRIVATE HEALTH INSURANCE

## 2012-11-14 DIAGNOSIS — B20 Human immunodeficiency virus [HIV] disease: Secondary | ICD-10-CM

## 2012-11-14 DIAGNOSIS — Z79899 Other long term (current) drug therapy: Secondary | ICD-10-CM

## 2012-11-14 DIAGNOSIS — Z113 Encounter for screening for infections with a predominantly sexual mode of transmission: Secondary | ICD-10-CM

## 2012-11-14 LAB — COMPREHENSIVE METABOLIC PANEL
Albumin: 4.4 g/dL (ref 3.5–5.2)
Alkaline Phosphatase: 153 U/L — ABNORMAL HIGH (ref 39–117)
BUN: 15 mg/dL (ref 6–23)
Creat: 0.98 mg/dL (ref 0.50–1.35)
Glucose, Bld: 95 mg/dL (ref 70–99)
Potassium: 4.3 mEq/L (ref 3.5–5.3)
Total Bilirubin: 0.5 mg/dL (ref 0.3–1.2)

## 2012-11-14 LAB — CBC
HCT: 38.4 % — ABNORMAL LOW (ref 39.0–52.0)
Hemoglobin: 13.1 g/dL (ref 13.0–17.0)
MCH: 27 pg (ref 26.0–34.0)
MCHC: 34.1 g/dL (ref 30.0–36.0)
MCV: 79.2 fL (ref 78.0–100.0)
RDW: 15.8 % — ABNORMAL HIGH (ref 11.5–15.5)

## 2012-11-14 LAB — LIPID PANEL
HDL: 39 mg/dL — ABNORMAL LOW (ref >39)
LDL Cholesterol: 90 mg/dL (ref 0–99)
Total CHOL/HDL Ratio: 3.7 Ratio
Triglycerides: 85 mg/dL (ref <150)

## 2012-11-19 ENCOUNTER — Ambulatory Visit: Payer: PRIVATE HEALTH INSURANCE | Admitting: Cardiovascular Disease

## 2012-11-25 ENCOUNTER — Encounter: Payer: Self-pay | Admitting: *Deleted

## 2012-11-28 ENCOUNTER — Ambulatory Visit (INDEPENDENT_AMBULATORY_CARE_PROVIDER_SITE_OTHER): Payer: PRIVATE HEALTH INSURANCE | Admitting: Infectious Diseases

## 2012-11-28 ENCOUNTER — Encounter: Payer: Self-pay | Admitting: Infectious Diseases

## 2012-11-28 VITALS — BP 117/77 | HR 105 | Temp 97.8°F | Ht 74.0 in | Wt 211.0 lb

## 2012-11-28 DIAGNOSIS — I872 Venous insufficiency (chronic) (peripheral): Secondary | ICD-10-CM

## 2012-11-28 DIAGNOSIS — G629 Polyneuropathy, unspecified: Secondary | ICD-10-CM

## 2012-11-28 DIAGNOSIS — S61209A Unspecified open wound of unspecified finger without damage to nail, initial encounter: Secondary | ICD-10-CM

## 2012-11-28 DIAGNOSIS — F17200 Nicotine dependence, unspecified, uncomplicated: Secondary | ICD-10-CM

## 2012-11-28 DIAGNOSIS — B20 Human immunodeficiency virus [HIV] disease: Secondary | ICD-10-CM

## 2012-11-28 DIAGNOSIS — G589 Mononeuropathy, unspecified: Secondary | ICD-10-CM

## 2012-11-28 MED ORDER — MORPHINE SULFATE ER 40 MG PO CP24: 1.0000 | ORAL_CAPSULE | Freq: Every day | ORAL | Status: DC | PRN

## 2012-11-28 MED ORDER — MORPHINE SULFATE ER 40 MG PO CP24: 40.0000 mg | ORAL_CAPSULE | Freq: Two times a day (BID) | ORAL | Status: DC

## 2012-11-28 NOTE — Progress Notes (Signed)
  Subjective:    Patient ID: [name], male    DOB: [birth year], 52 y.o.   MRN: [id number]  HPI 52 yo M with neuropathy, HIV/AIDS, DM that is diet controlled, suspected Marfan's syndrome, and COPD. He was started on complera 2012.  TTE 09-22-11- Grade I diastolic dysfunction.  Has been seen in Ed x2 recently- scratched cornea, finger laceration. Has cut down to 6 cigarettes/day. Has seen podiatry, taking lamisil.  Asks for refill of his kadian. Cancelled is colonoscopy (scared), will reschedule.   HIV 1 RNA Quant (copies/mL)  Date Value  11/14/2012 <20   04/23/2012 <20   01/02/2012 <20      CD4 T Cell Abs (cmm)  Date Value  11/14/2012 120*  04/23/2012 80*  01/02/2012 110*      Review of Systems  Constitutional: Negative for fever, chills, appetite change and unexpected weight change.  Respiratory: Positive for cough.        DOE  Cardiovascular: Positive for leg swelling.  Gastrointestinal: Negative for diarrhea and constipation.  Genitourinary: Negative for difficulty urinating.       Objective:   Physical Exam  Constitutional: He appears well-developed and well-nourished.  HENT:  Mouth/Throat: No oropharyngeal exudate.  Eyes: EOM are normal. Pupils are equal, round, and reactive to light.  Neck: Neck supple.  Cardiovascular: Normal rate and regular rhythm.   Murmur heard. Pulmonary/Chest: Effort normal and breath sounds normal. No respiratory distress. He has no wheezes. He has no rales.  Abdominal: Soft. Bowel sounds are normal. There is no tenderness.  Musculoskeletal: He exhibits edema.  Lymphadenopathy:    He has no cervical adenopathy.          Assessment & Plan:

## 2012-11-28 NOTE — Assessment & Plan Note (Signed)
Wants his rx increased for occasional breakthrough.

## 2012-11-28 NOTE — Assessment & Plan Note (Signed)
Encouraged to quit, he is aware and trying.

## 2012-11-28 NOTE — Assessment & Plan Note (Signed)
Well healing. Mark as resolved.

## 2012-11-28 NOTE — Assessment & Plan Note (Signed)
Seems to be working hard to get his legs taken care of. Occasionally falls asleep in chair with his feet down. Continue his diuretic, has podiatry f/u for nail care.

## 2012-11-28 NOTE — Assessment & Plan Note (Signed)
His CD4 has improved. Will continue his current rx as he is undetectable. His vax are up to date. Will see him back in 1 yr.

## 2012-12-03 ENCOUNTER — Ambulatory Visit (INDEPENDENT_AMBULATORY_CARE_PROVIDER_SITE_OTHER): Payer: PRIVATE HEALTH INSURANCE | Admitting: Cardiovascular Disease

## 2012-12-03 ENCOUNTER — Encounter: Payer: Self-pay | Admitting: Cardiovascular Disease

## 2012-12-03 VITALS — BP 110/72 | HR 72

## 2012-12-03 DIAGNOSIS — Q8740 Marfan's syndrome, unspecified: Secondary | ICD-10-CM

## 2012-12-03 DIAGNOSIS — I872 Venous insufficiency (chronic) (peripheral): Secondary | ICD-10-CM
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
## 2012-12-03 NOTE — Assessment & Plan Note (Signed)
Patient has a history of Marfan syndrome. I do hear a 2/6 decrescendo murmur at the left sternal border consistent with aortic insufficiency and allowed mitral regurgitation murmur. We'll get a 2-D echocardiogram to assess

## 2012-12-03 NOTE — Patient Instructions (Signed)
Dr Allyson Sabal will see you back in 1 month  Dr Allyson Sabal has ordered a echocardiogram and a venous reflux study (ultrasound of the veins in your legs)

## 2012-12-03 NOTE — Assessment & Plan Note (Signed)
The patient was referred by Dr. Elvin So from [facility] for changes of venous stasis. He has 2+ pedal pulses. I doubt he has claudication. I am going to get venous reflux studies.

## 2012-12-12 ENCOUNTER — Other Ambulatory Visit: Payer: Self-pay | Admitting: Licensed Clinical Social Worker

## 2012-12-12 DIAGNOSIS — G609 Hereditary and idiopathic neuropathy, unspecified: Secondary | ICD-10-CM

## 2013-01-03 ENCOUNTER — Ambulatory Visit: Payer: PRIVATE HEALTH INSURANCE | Admitting: Cardiovascular Disease

## 2013-01-09 ENCOUNTER — Telehealth: Payer: Self-pay | Admitting: *Deleted

## 2013-01-09 DIAGNOSIS — G629 Polyneuropathy, unspecified: Secondary | ICD-10-CM

## 2013-01-09 MED ORDER — MORPHINE SULFATE ER 40 MG PO CP24: 40.0000 mg | ORAL_CAPSULE | Freq: Two times a day (BID) | ORAL | Status: DC

## 2013-01-09 NOTE — Telephone Encounter (Signed)
Dr Ninetta Lights signed rx.  Placed in file for pt pick-up.

## 2013-01-10 ENCOUNTER — Encounter: Payer: Self-pay | Admitting: Cardiovascular Disease

## 2013-01-10 ENCOUNTER — Ambulatory Visit (INDEPENDENT_AMBULATORY_CARE_PROVIDER_SITE_OTHER): Payer: PRIVATE HEALTH INSURANCE | Admitting: Cardiovascular Disease

## 2013-01-10 VITALS — BP 128/80 | HR 88 | Ht 75.0 in | Wt 210.5 lb

## 2013-01-10 DIAGNOSIS — I739 Peripheral vascular disease, unspecified: Secondary | ICD-10-CM

## 2013-01-10 NOTE — Progress Notes (Signed)
[name] did not have his 2-D echo or venous Dopplers performed prior to his appointment which was the entire purpose to review the results. We will reschedule his noninvasive test and followup appointment.  Runell Gess, M.D., [facility] [address] [place], [place]  [address]  [phone] 01/10/2013 12:21 PM

## 2013-01-21 ENCOUNTER — Ambulatory Visit (HOSPITAL_COMMUNITY)
Admission: RE | Admit: 2013-01-21 | Discharge: 2013-01-21 | Disposition: A | Discharge status: Home / Self Care | Payer: PRIVATE HEALTH INSURANCE | Source: Ambulatory Visit | Attending: Cardiology | Admitting: Cardiology

## 2013-01-21 ENCOUNTER — Other Ambulatory Visit (HOSPITAL_COMMUNITY): Payer: Self-pay | Admitting: Cardiovascular Disease

## 2013-01-21 ENCOUNTER — Ambulatory Visit (HOSPITAL_BASED_OUTPATIENT_CLINIC_OR_DEPARTMENT_OTHER)
Admission: RE | Admit: 2013-01-21 | Discharge: 2013-01-21 | Disposition: A | Discharge status: Home / Self Care | Payer: PRIVATE HEALTH INSURANCE | Source: Ambulatory Visit | Attending: Cardiology | Admitting: Cardiology

## 2013-01-21 DIAGNOSIS — R011 Cardiac murmur, unspecified: Secondary | ICD-10-CM

## 2013-01-21 DIAGNOSIS — M7989 Other specified soft tissue disorders: Secondary | ICD-10-CM

## 2013-01-21 DIAGNOSIS — I872 Venous insufficiency (chronic) (peripheral): Secondary | ICD-10-CM

## 2013-01-21 DIAGNOSIS — Z21 Asymptomatic human immunodeficiency virus [HIV] infection status: Secondary | ICD-10-CM | POA: Insufficient documentation

## 2013-01-21 DIAGNOSIS — Q8740 Marfan's syndrome, unspecified: Secondary | ICD-10-CM | POA: Insufficient documentation

## 2013-01-21 DIAGNOSIS — F17200 Nicotine dependence, unspecified, uncomplicated: Secondary | ICD-10-CM | POA: Insufficient documentation

## 2013-01-21 NOTE — Progress Notes (Signed)
2D Echo Performed 01/21/2013    Tiyona Desouza, RCS  

## 2013-01-21 NOTE — Progress Notes (Signed)
Venous Duplex Completed. Marilynne Halsted, RDMS, RVT

## 2013-02-01 ENCOUNTER — Ambulatory Visit: Payer: PRIVATE HEALTH INSURANCE | Admitting: Cardiovascular Disease

## 2013-02-08 ENCOUNTER — Ambulatory Visit (INDEPENDENT_AMBULATORY_CARE_PROVIDER_SITE_OTHER): Payer: PRIVATE HEALTH INSURANCE | Admitting: Internal Medicine

## 2013-02-08 ENCOUNTER — Encounter: Payer: Self-pay | Admitting: Internal Medicine

## 2013-02-08 VITALS — BP 102/66 | HR 95 | Ht 74.0 in | Wt 211.6 lb

## 2013-02-08 DIAGNOSIS — I872 Venous insufficiency (chronic) (peripheral): Secondary | ICD-10-CM

## 2013-02-08 DIAGNOSIS — I447 Left bundle-branch block, unspecified: Secondary | ICD-10-CM

## 2013-02-08 DIAGNOSIS — I83893 Varicose veins of bilateral lower extremities with other complications: Secondary | ICD-10-CM

## 2013-02-08 DIAGNOSIS — Q8740 Marfan's syndrome, unspecified: Secondary | ICD-10-CM

## 2013-02-08 DIAGNOSIS — L259 Unspecified contact dermatitis, unspecified cause: Secondary | ICD-10-CM

## 2013-02-08 MED ORDER — DIAZEPAM 5 MG PO TABS: ORAL_TABLET | ORAL | Status: DC
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
## 2013-02-08 NOTE — Patient Instructions (Signed)
Dr. Rennis Golden has ordered you to have a venous ablation on your L leg.

## 2013-02-13 ENCOUNTER — Other Ambulatory Visit: Payer: Self-pay | Admitting: *Deleted

## 2013-02-13 DIAGNOSIS — G629 Polyneuropathy, unspecified: Secondary | ICD-10-CM

## 2013-02-13 MED ORDER — MORPHINE SULFATE ER 40 MG PO CP24: 40.0000 mg | ORAL_CAPSULE | Freq: Two times a day (BID) | ORAL | Status: DC

## 2013-02-14 ENCOUNTER — Other Ambulatory Visit: Payer: Self-pay | Admitting: Infectious Diseases

## 2013-02-14 DIAGNOSIS — I83893 Varicose veins of bilateral lower extremities with other complications: Secondary | ICD-10-CM

## 2013-03-12 ENCOUNTER — Ambulatory Visit (INDEPENDENT_AMBULATORY_CARE_PROVIDER_SITE_OTHER): Payer: PRIVATE HEALTH INSURANCE

## 2013-03-12 DIAGNOSIS — Z23 Encounter for immunization: Secondary | ICD-10-CM

## 2013-03-18 ENCOUNTER — Telehealth: Payer: Self-pay

## 2013-03-18 DIAGNOSIS — G629 Polyneuropathy, unspecified: Secondary | ICD-10-CM

## 2013-03-18 MED ORDER — MORPHINE SULFATE ER 40 MG PO CP24: 40.0000 mg | ORAL_CAPSULE | Freq: Two times a day (BID) | ORAL | Status: DC

## 2013-03-18 NOTE — Telephone Encounter (Signed)
Patient requesting refill of medications.   He forgot to call last week and only has one pill.  Dr Ninetta Lights is not in the office I will ask Dr Daiva Eves to sign the script .   Laurell Josephs, RN

## 2013-03-25 ENCOUNTER — Telehealth: Payer: Self-pay | Admitting: Internal Medicine

## 2013-03-25 NOTE — Telephone Encounter (Signed)
Returned call regarding medication questions pre-procedure.

## 2013-03-25 NOTE — Telephone Encounter (Signed)
Message forwarded to J. Elkins, RN.  

## 2013-03-25 NOTE — Telephone Encounter (Signed)
Please call-question about his procedure.

## 2013-03-26 ENCOUNTER — Ambulatory Visit (INDEPENDENT_AMBULATORY_CARE_PROVIDER_SITE_OTHER): Payer: PRIVATE HEALTH INSURANCE | Admitting: Internal Medicine

## 2013-03-26 DIAGNOSIS — I83893 Varicose veins of bilateral lower extremities with other complications: Secondary | ICD-10-CM

## 2013-03-26 NOTE — Telephone Encounter (Signed)
Wants to know when he can go back on his Lasix?

## 2013-03-26 NOTE — Telephone Encounter (Signed)
Message forwarded to J. Elkins, RN.  

## 2013-03-26 NOTE — Patient Instructions (Signed)
   POST OPERATIVE INSTRUCTIONS FOR VNUS RF ABLATION PROCEDURE  1. Leave wrap on leg for 48 hours until your repeat ultrasound is performed; then we will remove and fit a thigh-high compression stocking that you should wear every day (usually 2-3 weeks). You may remove it at night or to shower. 2. You may experience drainage from the needle stick sites. This is normal and this fluid should be clear or pinkish. 3. You may take a sponge bath during the first 48 hours. Do not remove wrap to shower until after 48 hours. Shower is preferable to tub bath. You may gently bathe the incision or puncture site with warm water and antibacterial soap. 4. Rest with leg elevated. Ambulate (walk around) at least 15 minutes, 4-5 times a day. 5. IMMEDIATELY report to the office (answering service is available after hours (any complaints of increasing pain, swelling, redness, fever, leg or calf tightness, or shortness of breath. 6. You should experience only mild to moderate discomfort at the incision site. You may take acetaminophen (Tylenol) or ibuprofen (Advil, Motrin) as needed for pain. 7. No exercise for 2 weeks. 8. Take one aspirin (81 mg once per day until your return visit), unless you are already on aspirin, warfarin, or other blood thinners such as Xarelto, Pradaxa or Eliquis, for other reasons, which you should continue taking. 9. Return to the office in 1-2 days for a repeat venous ultrasound. 10. Return to the office in 2-3 weeks for followup exam, unless there are problems in which an earlier appointment can be scheduled. 11. Please don't hesitate to call our office at [phone], with any questions or concerns.  Adreyan Carbajal C. Gurbani Figge, MD, FACC Attending Cardiologist [facility]  

## 2013-03-26 NOTE — Telephone Encounter (Signed)
Returned patient's call with instructions to resume lasix today, per Dr. Rennis Golden.

## 2013-03-28 ENCOUNTER — Ambulatory Visit (HOSPITAL_COMMUNITY)
Admission: RE | Admit: 2013-03-28 | Discharge: 2013-03-28 | Disposition: A | Discharge status: Home / Self Care | Payer: PRIVATE HEALTH INSURANCE | Source: Ambulatory Visit | Attending: Cardiovascular Disease | Admitting: Cardiovascular Disease

## 2013-03-28 DIAGNOSIS — I83893 Varicose veins of bilateral lower extremities with other complications: Secondary | ICD-10-CM

## 2013-03-28 DIAGNOSIS — Z48812 Encounter for surgical aftercare following surgery on the circulatory system: Secondary | ICD-10-CM

## 2013-03-28 DIAGNOSIS — I8390 Asymptomatic varicose veins of unspecified lower extremity: Secondary | ICD-10-CM | POA: Insufficient documentation

## 2013-03-28 NOTE — Progress Notes (Signed)
Left Lower Ext. Venous Duplex completed post venous closure procedure. Negative DVT and successful closure. Marilynne Halsted, BS, RDMS, RVT

## 2013-04-09 ENCOUNTER — Encounter: Payer: Self-pay | Admitting: Internal Medicine

## 2013-04-15 ENCOUNTER — Other Ambulatory Visit: Payer: Self-pay

## 2013-04-15 DIAGNOSIS — G629 Polyneuropathy, unspecified: Secondary | ICD-10-CM

## 2013-04-15 MED ORDER — MORPHINE SULFATE ER 40 MG PO CP24: 40.0000 mg | ORAL_CAPSULE | Freq: Two times a day (BID) | ORAL | Status: DC

## 2013-04-15 NOTE — Telephone Encounter (Signed)
Patient calling for refill of Morphine.    Laurell Josephs, RN

## 2013-04-17 ENCOUNTER — Ambulatory Visit (INDEPENDENT_AMBULATORY_CARE_PROVIDER_SITE_OTHER): Payer: PRIVATE HEALTH INSURANCE | Admitting: Internal Medicine

## 2013-04-17 ENCOUNTER — Encounter: Payer: Self-pay | Admitting: Internal Medicine

## 2013-04-17 VITALS — BP 124/70 | HR 84 | Ht 75.0 in | Wt 222.9 lb

## 2013-04-17 DIAGNOSIS — I83893 Varicose veins of bilateral lower extremities with other complications: Secondary | ICD-10-CM
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
## 2013-04-17 NOTE — Patient Instructions (Signed)
We will have our schedulers call you when we have another procedure clinic set up.

## 2013-05-09 ENCOUNTER — Telehealth: Payer: Self-pay | Admitting: *Deleted

## 2013-05-09 NOTE — Telephone Encounter (Signed)
Zakhi's sister has recently been diagnosed with cancer.  He has been taking her to the [facility] and carrying heavy objects for her.  He has been taking more than 2 tablets of morphine/day for several days and is going to run out of morphine 40 mg tablets prior to his regular refill on 05/16/13.  He is requesting a one-time prescription for 15 tablets to get him through to his regular refill on 05/16/13.  Please advise, print and sign for patient pick-up.

## 2013-05-13 ENCOUNTER — Other Ambulatory Visit: Payer: Self-pay | Admitting: *Deleted

## 2013-05-13 DIAGNOSIS — G629 Polyneuropathy, unspecified: Secondary | ICD-10-CM

## 2013-05-13 MED ORDER — MORPHINE SULFATE ER 40 MG PO CP24: 40.0000 mg | ORAL_CAPSULE | Freq: Two times a day (BID) | ORAL | Status: DC

## 2013-05-14 ENCOUNTER — Other Ambulatory Visit: Payer: Self-pay | Admitting: Infectious Diseases

## 2013-05-16 ENCOUNTER — Other Ambulatory Visit: Payer: Self-pay | Admitting: *Deleted

## 2013-05-16 DIAGNOSIS — G629 Polyneuropathy, unspecified: Secondary | ICD-10-CM

## 2013-05-16 MED ORDER — MORPHINE SULFATE ER 40 MG PO CP24: 40.0000 mg | ORAL_CAPSULE | Freq: Two times a day (BID) | ORAL | Status: DC

## 2013-05-24 ENCOUNTER — Telehealth: Payer: Self-pay | Admitting: *Deleted

## 2013-05-24 NOTE — Telephone Encounter (Signed)
Chest x-ray ok thanks

## 2013-05-24 NOTE — Telephone Encounter (Signed)
right side of his chest "feels congested" x 2 days requesting chest x-ray when he comes for lab work 05/28/13.   did have pain down his right arm briefly, it went away    did have sharp pain down his left arm 2 times "but it went away quickly"    Shortness of Breath with congestion    Pt wanted Dr. Ninetta Lights to know his symptoms and why he was requesting a chest x-ray.  Pt also read somewhere that "he should have a chest x-ray once a year."

## 2013-05-27 ENCOUNTER — Other Ambulatory Visit: Payer: Self-pay | Admitting: Infectious Diseases

## 2013-05-27 DIAGNOSIS — R0989 Other specified symptoms and signs involving the circulatory and respiratory systems: Secondary | ICD-10-CM

## 2013-05-27 NOTE — Telephone Encounter (Signed)
Chest Xray ordered for pt to do when he comes for Lab work.

## 2013-05-27 NOTE — Telephone Encounter (Signed)
Pt called that chest xray has been ordered for when he comes for lab work.

## 2013-05-28 ENCOUNTER — Other Ambulatory Visit: Payer: PRIVATE HEALTH INSURANCE

## 2013-05-28 ENCOUNTER — Ambulatory Visit
Admission: RE | Admit: 2013-05-28 | Discharge: 2013-05-28 | Disposition: A | Discharge status: Home / Self Care | Payer: PRIVATE HEALTH INSURANCE | Source: Ambulatory Visit | Attending: Infectious Diseases | Admitting: Infectious Diseases

## 2013-05-28 DIAGNOSIS — R0989 Other specified symptoms and signs involving the circulatory and respiratory systems: Secondary | ICD-10-CM

## 2013-05-28 DIAGNOSIS — B20 Human immunodeficiency virus [HIV] disease: Secondary | ICD-10-CM

## 2013-05-28 LAB — CBC WITH DIFFERENTIAL/PLATELET
Basophils Absolute: 0 10*3/uL (ref 0.0–0.1)
Basophils Relative: 0 % (ref 0–1)
Eosinophils Absolute: 0.1 10*3/uL (ref 0.0–0.7)
Eosinophils Relative: 3 % (ref 0–5)
HCT: 37.7 % — ABNORMAL LOW (ref 39.0–52.0)
Lymphocytes Relative: 25 % (ref 12–46)
MCH: 27 pg (ref 26.0–34.0)
MCHC: 34.5 g/dL (ref 30.0–36.0)
MCV: 78.4 fL (ref 78.0–100.0)
Monocytes Absolute: 0.3 10*3/uL (ref 0.1–1.0)
Monocytes Relative: 6 % (ref 3–12)
Platelets: 186 10*3/uL (ref 150–400)
RBC: 4.81 MIL/uL (ref 4.22–5.81)
RDW: 15.2 % (ref 11.5–15.5)
WBC: 4.8 10*3/uL (ref 4.0–10.5)

## 2013-05-28 LAB — COMPLETE METABOLIC PANEL WITH GFR
AST: 16 U/L (ref 0–37)
Albumin: 4.2 g/dL (ref 3.5–5.2)
Alkaline Phosphatase: 115 U/L (ref 39–117)
BUN: 15 mg/dL (ref 6–23)
CO2: 26 mEq/L (ref 19–32)
Calcium: 9.3 mg/dL (ref 8.4–10.5)
Chloride: 103 mEq/L (ref 96–112)
Creat: 1.11 mg/dL (ref 0.50–1.35)
Potassium: 4.6 mEq/L (ref 3.5–5.3)
Total Protein: 7.5 g/dL (ref 6.0–8.3)

## 2013-05-28 NOTE — Addendum Note (Signed)
Addended bySteva Colder on: 05/28/2013 11:07 AM   Modules accepted: Orders

## 2013-05-29 ENCOUNTER — Telehealth: Payer: Self-pay | Admitting: *Deleted

## 2013-05-29 NOTE — Telephone Encounter (Signed)
Left message with chest xray results.  Rescheduled 6 month follow-up appt to 06/21/13 w/ Dr. Ninetta Lights.

## 2013-06-12 ENCOUNTER — Encounter: Payer: Self-pay | Admitting: Infectious Diseases

## 2013-06-12 ENCOUNTER — Other Ambulatory Visit: Payer: Self-pay | Admitting: *Deleted

## 2013-06-12 ENCOUNTER — Ambulatory Visit (INDEPENDENT_AMBULATORY_CARE_PROVIDER_SITE_OTHER): Payer: PRIVATE HEALTH INSURANCE | Admitting: Infectious Diseases

## 2013-06-12 ENCOUNTER — Ambulatory Visit: Payer: PRIVATE HEALTH INSURANCE | Admitting: Infectious Diseases

## 2013-06-12 VITALS — BP 132/81 | HR 97 | Temp 97.7°F | Ht 75.0 in | Wt 227.0 lb

## 2013-06-12 DIAGNOSIS — G589 Mononeuropathy, unspecified: Secondary | ICD-10-CM

## 2013-06-12 DIAGNOSIS — R296 Repeated falls: Secondary | ICD-10-CM | POA: Insufficient documentation

## 2013-06-12 DIAGNOSIS — G629 Polyneuropathy, unspecified: Secondary | ICD-10-CM

## 2013-06-12 DIAGNOSIS — B20 Human immunodeficiency virus [HIV] disease: Secondary | ICD-10-CM

## 2013-06-12 DIAGNOSIS — Z113 Encounter for screening for infections with a predominantly sexual mode of transmission: Secondary | ICD-10-CM

## 2013-06-12 DIAGNOSIS — Z79899 Other long term (current) drug therapy: Secondary | ICD-10-CM

## 2013-06-12 DIAGNOSIS — W19XXXA Unspecified fall, initial encounter: Secondary | ICD-10-CM

## 2013-06-12 MED ORDER — SULFAMETHOXAZOLE-TMP DS 800-160 MG PO TABS: 1.0000 | ORAL_TABLET | ORAL | Status: DC

## 2013-06-12 MED ORDER — MORPHINE SULFATE ER 40 MG PO CP24: 40.0000 mg | ORAL_CAPSULE | Freq: Two times a day (BID) | ORAL | Status: DC

## 2013-06-12 NOTE — Progress Notes (Signed)
   Subjective:    Patient ID: [name], male    DOB: 1961-07-03, 52 y.o.   MRN: [id number]  HPI 52 yo M with neuropathy, HIV/AIDS, DM that is diet controlled, suspected Marfan's syndrome, and COPD. He was started on complera 2012.  TTE 09-22-11- Grade I diastolic dysfunction.  Has been seen in Ed x2 recently- scratched cornea, finger laceration. Has cut down to 6 cigarettes/day. Has seen podiatry, taking lamisil.  Asks for refill of his kadian.  Cancelled is colonoscopy (scared), will reschedule.  He underwent radiofrequency endovenous ablation of the left greater saphenous vein on 03/26/2013. Has called ID clinic recently with c/o cough. He had CXR showing "Emphysema, no active disease" on 05-28-13.  His cough is better. He quit smoking but is "vaping".  Is having trouble with his balance, feels like his equilibrium is off. Has had some R chest pain since falling, caught himself. Tripped over vacuum cleaner cord.  Still has LE edema. occas remembers to use his compression stockings.  Still having issues with L feet pain.  Taking Morphine for chronic pain, low back. Neuropathy.   HIV 1 RNA Quant (copies/mL)  Date Value  05/28/2013 <20   11/14/2012 <20   04/23/2012 <20      CD4 T Cell Abs (/uL)  Date Value  05/28/2013 100*  11/14/2012 120*  04/23/2012 80*     Review of Systems  Constitutional: Positive for unexpected weight change. Negative for appetite change.       25# up  Respiratory: Negative for cough and shortness of breath.   Gastrointestinal: Negative for diarrhea and constipation.  Genitourinary: Negative for difficulty urinating.       Objective:   Physical Exam  Constitutional: He appears well-developed and well-nourished.  HENT:  Mouth/Throat: No oropharyngeal exudate.  dry  Eyes: EOM are normal. Pupils are equal, round, and reactive to light.  Neck: Neck supple.  Cardiovascular: Normal rate, regular rhythm and normal heart sounds.   Pulmonary/Chest:  Effort normal and breath sounds normal.  Abdominal: Soft. Bowel sounds are normal.  Musculoskeletal: He exhibits edema.  Lymphadenopathy:    He has no cervical adenopathy.          Assessment & Plan:

## 2013-06-12 NOTE — Assessment & Plan Note (Signed)
If he has any further falls, will have him seen by neuro.

## 2013-06-12 NOTE — Assessment & Plan Note (Signed)
He appears to be doing well, chronically ill. Will start him on bactrim. Has gotten flu shot. will see him back in 3-4 months.

## 2013-06-12 NOTE — Assessment & Plan Note (Addendum)
Spoke to pt about getting him into pain clinic. He is reluctant to do this. Denies that he is sharing his meds.

## 2013-07-09 ENCOUNTER — Ambulatory Visit: Payer: PRIVATE HEALTH INSURANCE | Admitting: Infectious Diseases

## 2013-07-10 ENCOUNTER — Other Ambulatory Visit: Payer: Self-pay | Admitting: *Deleted

## 2013-07-10 DIAGNOSIS — G629 Polyneuropathy, unspecified: Secondary | ICD-10-CM

## 2013-07-10 MED ORDER — MORPHINE SULFATE ER 40 MG PO CP24: 40.0000 mg | ORAL_CAPSULE | Freq: Two times a day (BID) | ORAL | Status: DC

## 2013-07-22 ENCOUNTER — Encounter (HOSPITAL_COMMUNITY): Payer: Self-pay | Admitting: Emergency Medicine

## 2013-07-22 ENCOUNTER — Emergency Department (HOSPITAL_COMMUNITY)
Admission: EM | Admit: 2013-07-22 | Discharge: 2013-07-22 | Disposition: A | Discharge status: Home / Self Care | Payer: PRIVATE HEALTH INSURANCE | Attending: Emergency Medicine | Admitting: Emergency Medicine

## 2013-07-22 DIAGNOSIS — Z79899 Other long term (current) drug therapy: Secondary | ICD-10-CM | POA: Insufficient documentation

## 2013-07-22 DIAGNOSIS — Z87891 Personal history of nicotine dependence: Secondary | ICD-10-CM | POA: Insufficient documentation

## 2013-07-22 DIAGNOSIS — Z21 Asymptomatic human immunodeficiency virus [HIV] infection status: Secondary | ICD-10-CM | POA: Insufficient documentation

## 2013-07-22 DIAGNOSIS — I83893 Varicose veins of bilateral lower extremities with other complications: Secondary | ICD-10-CM | POA: Insufficient documentation

## 2013-07-22 DIAGNOSIS — Q8740 Marfan's syndrome, unspecified: Secondary | ICD-10-CM | POA: Insufficient documentation

## 2013-07-22 DIAGNOSIS — I8390 Asymptomatic varicose veins of unspecified lower extremity: Secondary | ICD-10-CM

## 2013-07-22 DIAGNOSIS — F3289 Other specified depressive episodes: Secondary | ICD-10-CM | POA: Insufficient documentation

## 2013-07-22 DIAGNOSIS — I739 Peripheral vascular disease, unspecified: Secondary | ICD-10-CM | POA: Insufficient documentation

## 2013-07-22 DIAGNOSIS — Z8669 Personal history of other diseases of the nervous system and sense organs: Secondary | ICD-10-CM | POA: Insufficient documentation

## 2013-07-22 DIAGNOSIS — F329 Major depressive disorder, single episode, unspecified: Secondary | ICD-10-CM | POA: Insufficient documentation

## 2013-07-22 DIAGNOSIS — Z8619 Personal history of other infectious and parasitic diseases: Secondary | ICD-10-CM | POA: Insufficient documentation

## 2013-07-22 LAB — CBC
HCT: 38.1 % — ABNORMAL LOW (ref 39.0–52.0)
Hemoglobin: 12.4 g/dL — ABNORMAL LOW (ref 13.0–17.0)
MCH: 27.2 pg (ref 26.0–34.0)
MCHC: 32.5 g/dL (ref 30.0–36.0)
MCV: 83.6 fL (ref 78.0–100.0)
PLATELETS: 171 10*3/uL (ref 150–400)
RBC: 4.56 MIL/uL (ref 4.22–5.81)
RDW: 15.2 % (ref 11.5–15.5)
WBC: 6.4 10*3/uL (ref 4.0–10.5)

## 2013-07-22 NOTE — ED Notes (Signed)
Bed: WA19 Expected date:  Expected time:  Means of arrival:  Comments: EMS 

## 2013-07-22 NOTE — ED Provider Notes (Signed)
CSN: [id number]     Arrival date & time 07/22/13  1222 History   First MD Initiated Contact with Patient 07/22/13 1325     Chief Complaint  Patient presents with  . Foot Injury   (Consider location/radiation/quality/duration/timing/severity/associated sxs/prior Treatment) HPI Comments: Patient presents to ER for evaluation of bleeding from his right great toe. Patient reports that he walked to the bathroom, sat down on the toilet and then noticed that his foot was bleeding. Patient reports the stream of blood shooting out of a varicose vein on the base of his right great toe. He denies any injury to the area. There is no pain.  Patient is a 53 y.o. male presenting with foot injury.  Foot Injury   Past Medical History  Diagnosis Date  . Chronic hepatitis B   . HIV (human immunodeficiency virus infection)   . Cholesteatoma   . Depression   . Marfan syndrome     Dr Ninetta LightsHatcher follows  . Venous insufficiency (chronic) (peripheral)    Past Surgical History  Procedure Laterality Date  . Cholesteotoma removal    . Appendectomy    . Repair of perforated colon     Family History  Problem Relation Age of Onset  . Cancer Mother   . Stroke Sister   . Hypertension Sister   . Hypertension Mother    History  Substance Use Topics  . Smoking status: Former Smoker -- 0.75 packs/day for 35 years    Types: Cigarettes  . Smokeless tobacco: Never Used     Comment: congratulated! using e-cigarettes  . Alcohol Use: No    Review of Systems  Skin: Positive for wound.  All other systems reviewed and are negative.    Allergies  Tetracycline hcl  Home Medications   Current Outpatient Rx  Name  Route  Sig  Dispense  Refill  . ALPRAZolam (XANAX) 0.5 MG tablet   Oral   Take 0.5 mg by mouth every 8 (eight) hours.           . Avocado Oil OIL   Oral   Take 15 mLs by mouth at bedtime.         . clonazePAM (KLONOPIN) 2 MG tablet   Oral   Take 1 mg by mouth at bedtime as needed.           . COCONUT OIL PO   Oral   Take 15 mLs by mouth at bedtime.         Marland Kitchen. Emtricitab-Rilpivir-Tenofovir (COMPLERA) 200-25-300 MG TABS   Oral   Take 1 tablet by mouth at bedtime.         . furosemide (LASIX) 40 MG tablet   Oral   Take 20 mg by mouth every morning.         . Morphine Sulfate 40 MG CP24   Oral   Take 40 mg by mouth 2 (two) times daily.   60 capsule   0   . Multiple Vitamin (MULTIVITAMIN) tablet   Oral   Take 1 tablet by mouth at bedtime.          . nortriptyline (PAMELOR) 75 MG capsule   Oral   Take 150 mg by mouth at bedtime.         Marland Kitchen. omeprazole (PRILOSEC) 40 MG capsule   Oral   Take 1 capsule (40 mg total) by mouth daily.   30 capsule   11   . spironolactone (ALDACTONE) 25 MG tablet   Oral  Take 25 mg by mouth every morning.          BP 111/66  Pulse 84  Temp(Src) 97.8 F (36.6 C) (Oral)  Resp 19  SpO2 94% Physical Exam  Constitutional: He is oriented to person, place, and time. He appears well-developed and well-nourished. No distress.  HENT:  Head: Normocephalic and atraumatic.  Right Ear: Hearing normal.  Left Ear: Hearing normal.  Nose: Nose normal.  Mouth/Throat: Oropharynx is clear and moist and mucous membranes are normal.  Eyes: Conjunctivae and EOM are normal. Pupils are equal, round, and reactive to light.  Neck: Normal range of motion. Neck supple.  Cardiovascular: Regular rhythm, S1 normal and S2 normal.  Exam reveals no gallop and no friction rub.   No murmur heard. Pulmonary/Chest: Effort normal and breath sounds normal. No respiratory distress. He exhibits no tenderness.  Abdominal: Soft. Normal appearance and bowel sounds are normal. There is no hepatosplenomegaly. There is no tenderness. There is no rebound, no guarding, no tenderness at McBurney's point and negative Murphy's sign. No hernia.  Musculoskeletal: Normal range of motion.  Neurological: He is alert and oriented to person, place, and time. He has  normal strength. No cranial nerve deficit or sensory deficit. Coordination normal. GCS eye subscore is 4. GCS verbal subscore is 5. GCS motor subscore is 6.  Skin: Skin is warm, dry and intact. No rash noted. No cyanosis.  Diffuse varicosities bilateral feet and lower legs  Ruptured her custody dorsal aspect of the great toe at the base, oosing blood.  Psychiatric: He has a normal mood and affect. His speech is normal and behavior is normal. Thought content normal.    ED Course  Procedures (including critical care time) Labs Review Labs Reviewed  CBC   Imaging Review No results found.  EKG Interpretation   None       MDM  Diagnosis: Bleeding varicosity, right great toe  Patient presents to ER with spontaneous bleed from a small varicosity on his right great toe. At time of arrival, the bleeding had significantly diminished, was only oozing blood. Hemostatic pressure dressing applied. Patient will rest and elevate the area to return for significant bleeding that does not respond to direct pressure.    Gilda Crease, MD 07/22/13 1505

## 2013-07-22 NOTE — Discharge Instructions (Signed)
Varicose Veins  Varicose veins are veins that have become enlarged and twisted.  CAUSES  This condition is the result of valves in the veins not working properly. Valves in the veins help return blood from the leg to the heart. If these valves are damaged, blood flows backwards and backs up into the veins in the leg near the skin. This causes the veins to become larger. People who are on their feet a lot, who are pregnant, or who are overweight are more likely to develop varicose veins.  SYMPTOMS   · Bulging, twisted-appearing, bluish veins, most commonly found on the legs.  · Leg pain or a feeling of heaviness. These symptoms may be worse at the end of the day.  · Leg swelling.  · Skin color changes.  DIAGNOSIS   Varicose veins can usually be diagnosed with an exam of your legs by your caregiver. He or she may recommend an ultrasound of your leg veins.  TREATMENT   Most varicose veins can be treated at home. However, other treatments are available for people who have persistent symptoms or who want to treat the cosmetic appearance of the varicose veins. These include:  · Laser treatment of very small varicose veins.  · Medicine that is shot (injected) into the vein. This medicine hardens the walls of the vein and closes off the vein. This treatment is called sclerotherapy. Afterwards, you may need to wear clothing or bandages that apply pressure.  · Surgery.  HOME CARE INSTRUCTIONS   · Do not stand or sit in one position for long periods of time. Do not sit with your legs crossed. Rest with your legs raised during the day.  · Wear elastic stockings or support hose. Do not wear other tight, encircling garments around the legs, pelvis, or waist.  · Walk as much as possible to increase blood flow.  · Raise the foot of your bed at night with 2-inch blocks.  · If you get a cut in the skin over the vein and the vein bleeds, lie down with your leg raised and press on it with a clean cloth until the bleeding stops. Then  place a bandage (dressing) on the cut. See your caregiver if it continues to bleed or needs stitches.  SEEK MEDICAL CARE IF:   · The skin around your ankle starts to break down.  · You have pain, redness, tenderness, or hard swelling developing in your leg over a vein.  · You are uncomfortable due to leg pain.  Document Released: 03/30/2005 Document Revised: 09/12/2011 Document Reviewed: 08/16/2010  [employer]® Patient Information ©2014 [employer].

## 2013-07-22 NOTE — ED Notes (Signed)
Per EMS pt walked to bathroom like normal when sitting on the toilet noticed "shooting" blood from varicose vein to right great toe. Bleeding controlled and guaze/tape applied en route with EMS. Pt has hx of varicose veins to legs bilaterally.

## 2013-08-12 ENCOUNTER — Other Ambulatory Visit: Payer: Self-pay | Admitting: Licensed Clinical Social Worker

## 2013-08-12 DIAGNOSIS — G629 Polyneuropathy, unspecified: Secondary | ICD-10-CM

## 2013-08-12 MED ORDER — MORPHINE SULFATE ER 40 MG PO CP24: 40.0000 mg | ORAL_CAPSULE | Freq: Two times a day (BID) | ORAL | Status: DC

## 2013-08-29 ENCOUNTER — Other Ambulatory Visit: Payer: PRIVATE HEALTH INSURANCE

## 2013-09-09 ENCOUNTER — Encounter: Payer: Self-pay | Admitting: Infectious Diseases

## 2013-09-09 ENCOUNTER — Ambulatory Visit (INDEPENDENT_AMBULATORY_CARE_PROVIDER_SITE_OTHER): Payer: PRIVATE HEALTH INSURANCE | Admitting: Infectious Diseases

## 2013-09-09 ENCOUNTER — Other Ambulatory Visit: Payer: Self-pay | Admitting: *Deleted

## 2013-09-09 VITALS — BP 143/85 | HR 99 | Temp 98.0°F | Ht 75.0 in | Wt 237.0 lb

## 2013-09-09 DIAGNOSIS — G589 Mononeuropathy, unspecified: Secondary | ICD-10-CM

## 2013-09-09 DIAGNOSIS — F17200 Nicotine dependence, unspecified, uncomplicated: Secondary | ICD-10-CM

## 2013-09-09 DIAGNOSIS — Z79899 Other long term (current) drug therapy: Secondary | ICD-10-CM

## 2013-09-09 DIAGNOSIS — B20 Human immunodeficiency virus [HIV] disease: Secondary | ICD-10-CM

## 2013-09-09 DIAGNOSIS — G629 Polyneuropathy, unspecified: Secondary | ICD-10-CM

## 2013-09-09 DIAGNOSIS — Z113 Encounter for screening for infections with a predominantly sexual mode of transmission: Secondary | ICD-10-CM

## 2013-09-09 LAB — COMPREHENSIVE METABOLIC PANEL
ALT: 18 U/L (ref 0–53)
AST: 16 U/L (ref 0–37)
Albumin: 4.2 g/dL (ref 3.5–5.2)
Alkaline Phosphatase: 121 U/L — ABNORMAL HIGH (ref 39–117)
BUN: 17 mg/dL (ref 6–23)
CALCIUM: 8.8 mg/dL (ref 8.4–10.5)
CHLORIDE: 101 meq/L (ref 96–112)
CO2: 26 meq/L (ref 19–32)
CREATININE: 1 mg/dL (ref 0.50–1.35)
Glucose, Bld: 121 mg/dL — ABNORMAL HIGH (ref 70–99)
Potassium: 4.3 mEq/L (ref 3.5–5.3)
Sodium: 138 mEq/L (ref 135–145)
Total Bilirubin: 0.3 mg/dL (ref 0.2–1.2)
Total Protein: 7.3 g/dL (ref 6.0–8.3)

## 2013-09-09 LAB — CBC
HEMATOCRIT: 37.2 % — AB (ref 39.0–52.0)
HEMOGLOBIN: 12.6 g/dL — AB (ref 13.0–17.0)
MCH: 26.7 pg (ref 26.0–34.0)
MCHC: 33.9 g/dL (ref 30.0–36.0)
MCV: 78.8 fL (ref 78.0–100.0)
Platelets: 184 10*3/uL (ref 150–400)
RBC: 4.72 MIL/uL (ref 4.22–5.81)
RDW: 15.7 % — ABNORMAL HIGH (ref 11.5–15.5)
WBC: 5.1 10*3/uL (ref 4.0–10.5)

## 2013-09-09 LAB — LIPID PANEL
CHOLESTEROL: 130 mg/dL (ref 0–200)
HDL: 37 mg/dL — ABNORMAL LOW (ref >39)
LDL Cholesterol: 71 mg/dL (ref 0–99)
Total CHOL/HDL Ratio: 3.5 Ratio
Triglycerides: 111 mg/dL (ref <150)
VLDL: 22 mg/dL (ref 0–40)

## 2013-09-09 LAB — RPR

## 2013-09-09 MED ORDER — MORPHINE SULFATE ER 40 MG PO CP24: 40.0000 mg | ORAL_CAPSULE | Freq: Two times a day (BID) | ORAL | Status: DC

## 2013-09-09 NOTE — Assessment & Plan Note (Signed)
His pain rx is refilled. I suggested we have him eval at pain center. He is hesitant would like to re-eval at his f/u appt. I agreed to this.

## 2013-09-09 NOTE — Assessment & Plan Note (Signed)
Has quit 

## 2013-09-09 NOTE — Progress Notes (Signed)
   Subjective:    Patient ID: [name], male    DOB: July 07, 1960, 53 y.o.   MRN: [id number]  HPI 53 yo M with neuropathy, HIV/AIDS, DM that is diet controlled, suspected Marfan's syndrome, and COPD. He was started on complera 2012.  TTE 09-22-11- Grade I diastolic dysfunction.  Has quit smoking and now vaping- not using nicotene. Has seen podiatry, taking lamisil.  Cancelled is colonoscopy (scared), will reschedule.  He underwent radiofrequency endovenous ablation of the left greater saphenous vein on 03/26/2013.  Was not able to get pre-visit labs, denies missed meds. Has gained wt since quitting smoking. Knows he needs to start exercising again. Eating "too good". Bowels are more regular.  Still having LE edema. Has had episodes of bleeding from his toe that has been persistent. Had to go to ED. Has had more pain in his L foot since he had the procedure done.   HIV 1 RNA Quant (copies/mL)  Date Value  05/28/2013 <20   11/14/2012 <20   04/23/2012 <20      CD4 T Cell Abs (/uL)  Date Value  05/28/2013 100*  11/14/2012 120*  04/23/2012 80*   Review of Systems     Objective:   Physical Exam  Constitutional: He appears well-developed and well-nourished.  Eyes: EOM are normal. Pupils are equal, round, and reactive to light.  Neck: Neck supple.  Cardiovascular: Normal rate, regular rhythm and normal heart sounds.   Pulmonary/Chest: Effort normal and breath sounds normal.  Abdominal: Soft. Bowel sounds are normal. There is no tenderness.  Musculoskeletal: He exhibits edema.  Lymphadenopathy:    He has no cervical adenopathy.          Assessment & Plan:

## 2013-09-09 NOTE — Assessment & Plan Note (Signed)
He's doing very well. Will check his labs today. His vax are up to date. He asks about MMR, he has not been exposed recently. Will see him back in 6 months.

## 2013-09-10 LAB — HIV-1 RNA QUANT-NO REFLEX-BLD

## 2013-09-10 LAB — T-HELPER CELLS (CD4) COUNT (NOT AT ARMC)
CD4 % Helper T Cell: 9 % — ABNORMAL LOW (ref 33–55)
CD4 T Cell Abs: 100 /uL — ABNORMAL LOW (ref 400–2700)

## 2013-09-23 ENCOUNTER — Encounter (HOSPITAL_COMMUNITY): Payer: Self-pay | Admitting: Emergency Medicine

## 2013-09-23 ENCOUNTER — Emergency Department (HOSPITAL_COMMUNITY): Payer: PRIVATE HEALTH INSURANCE

## 2013-09-23 ENCOUNTER — Inpatient Hospital Stay (HOSPITAL_COMMUNITY)
Admission: EM | Admit: 2013-09-23 | Discharge: 2013-09-26 | DRG: 286 | Disposition: A | Discharge status: Home / Self Care | Payer: PRIVATE HEALTH INSURANCE | Attending: Interventional Cardiology | Admitting: Interventional Cardiology

## 2013-09-23 DIAGNOSIS — L259 Unspecified contact dermatitis, unspecified cause: Secondary | ICD-10-CM

## 2013-09-23 DIAGNOSIS — I959 Hypotension, unspecified: Secondary | ICD-10-CM | POA: Diagnosis present

## 2013-09-23 DIAGNOSIS — J209 Acute bronchitis, unspecified: Secondary | ICD-10-CM | POA: Diagnosis present

## 2013-09-23 DIAGNOSIS — Z87891 Personal history of nicotine dependence: Secondary | ICD-10-CM

## 2013-09-23 DIAGNOSIS — E1149 Type 2 diabetes mellitus with other diabetic neurological complication: Secondary | ICD-10-CM | POA: Diagnosis present

## 2013-09-23 DIAGNOSIS — I472 Ventricular tachycardia, unspecified: Principal | ICD-10-CM | POA: Diagnosis present

## 2013-09-23 DIAGNOSIS — I059 Rheumatic mitral valve disease, unspecified: Secondary | ICD-10-CM | POA: Diagnosis present

## 2013-09-23 DIAGNOSIS — R600 Localized edema: Secondary | ICD-10-CM | POA: Diagnosis present

## 2013-09-23 DIAGNOSIS — B181 Chronic viral hepatitis B without delta-agent: Secondary | ICD-10-CM | POA: Diagnosis present

## 2013-09-23 DIAGNOSIS — L97909 Non-pressure chronic ulcer of unspecified part of unspecified lower leg with unspecified severity: Secondary | ICD-10-CM

## 2013-09-23 DIAGNOSIS — F329 Major depressive disorder, single episode, unspecified: Secondary | ICD-10-CM | POA: Diagnosis present

## 2013-09-23 DIAGNOSIS — G589 Mononeuropathy, unspecified: Secondary | ICD-10-CM

## 2013-09-23 DIAGNOSIS — Q8740 Marfan's syndrome, unspecified: Secondary | ICD-10-CM | POA: Diagnosis present

## 2013-09-23 DIAGNOSIS — E119 Type 2 diabetes mellitus without complications: Secondary | ICD-10-CM | POA: Diagnosis present

## 2013-09-23 DIAGNOSIS — Z8619 Personal history of other infectious and parasitic diseases: Secondary | ICD-10-CM | POA: Diagnosis present

## 2013-09-23 DIAGNOSIS — F3289 Other specified depressive episodes: Secondary | ICD-10-CM | POA: Diagnosis present

## 2013-09-23 DIAGNOSIS — I4729 Other ventricular tachycardia: Principal | ICD-10-CM | POA: Diagnosis present

## 2013-09-23 DIAGNOSIS — F17200 Nicotine dependence, unspecified, uncomplicated: Secondary | ICD-10-CM

## 2013-09-23 DIAGNOSIS — B20 Human immunodeficiency virus [HIV] disease: Secondary | ICD-10-CM | POA: Diagnosis present

## 2013-09-23 DIAGNOSIS — I83893 Varicose veins of bilateral lower extremities with other complications: Secondary | ICD-10-CM | POA: Diagnosis present

## 2013-09-23 DIAGNOSIS — R197 Diarrhea, unspecified: Secondary | ICD-10-CM

## 2013-09-23 DIAGNOSIS — W19XXXA Unspecified fall, initial encounter: Secondary | ICD-10-CM

## 2013-09-23 DIAGNOSIS — J440 Chronic obstructive pulmonary disease with acute lower respiratory infection: Secondary | ICD-10-CM

## 2013-09-23 DIAGNOSIS — L97529 Non-pressure chronic ulcer of other part of left foot with unspecified severity: Secondary | ICD-10-CM

## 2013-09-23 DIAGNOSIS — K219 Gastro-esophageal reflux disease without esophagitis: Secondary | ICD-10-CM

## 2013-09-23 DIAGNOSIS — R0789 Other chest pain: Secondary | ICD-10-CM | POA: Diagnosis present

## 2013-09-23 DIAGNOSIS — E11621 Type 2 diabetes mellitus with foot ulcer: Secondary | ICD-10-CM

## 2013-09-23 DIAGNOSIS — J449 Chronic obstructive pulmonary disease, unspecified: Secondary | ICD-10-CM

## 2013-09-23 DIAGNOSIS — J4489 Other specified chronic obstructive pulmonary disease: Secondary | ICD-10-CM | POA: Diagnosis present

## 2013-09-23 DIAGNOSIS — IMO0001 Reserved for inherently not codable concepts without codable children: Secondary | ICD-10-CM

## 2013-09-23 DIAGNOSIS — F4310 Post-traumatic stress disorder, unspecified: Secondary | ICD-10-CM | POA: Diagnosis present

## 2013-09-23 DIAGNOSIS — E1142 Type 2 diabetes mellitus with diabetic polyneuropathy: Secondary | ICD-10-CM | POA: Diagnosis present

## 2013-09-23 DIAGNOSIS — I83009 Varicose veins of unspecified lower extremity with ulcer of unspecified site: Secondary | ICD-10-CM | POA: Diagnosis present

## 2013-09-23 DIAGNOSIS — R4182 Altered mental status, unspecified: Secondary | ICD-10-CM

## 2013-09-23 DIAGNOSIS — Z79899 Other long term (current) drug therapy: Secondary | ICD-10-CM

## 2013-09-23 HISTORY — DX: Nonrheumatic mitral (valve) prolapse: I34.1

## 2013-09-23 LAB — PROTIME-INR
INR: 1.05 (ref 0.00–1.49)
Prothrombin Time: 13.5 seconds (ref 11.6–15.2)

## 2013-09-23 LAB — BASIC METABOLIC PANEL
BUN: 23 mg/dL (ref 6–23)
CALCIUM: 10.3 mg/dL (ref 8.4–10.5)
CO2: 24 mEq/L (ref 19–32)
Chloride: 99 mEq/L (ref 96–112)
Creatinine, Ser: 1.12 mg/dL (ref 0.50–1.35)
GFR calc non Af Amer: 73 mL/min — ABNORMAL LOW (ref >90)
GFR, EST AFRICAN AMERICAN: 85 mL/min — AB (ref >90)
GLUCOSE: 114 mg/dL — AB (ref 70–99)
Potassium: 5 mEq/L (ref 3.7–5.3)
SODIUM: 139 meq/L (ref 137–147)

## 2013-09-23 LAB — MRSA PCR SCREENING: MRSA by PCR: NEGATIVE

## 2013-09-23 LAB — I-STAT CHEM 8, ED
BUN: 32 mg/dL — ABNORMAL HIGH (ref 6–23)
Calcium, Ion: 1.18 mmol/L (ref 1.12–1.23)
Chloride: 102 mEq/L (ref 96–112)
Creatinine, Ser: 1.3 mg/dL (ref 0.50–1.35)
GLUCOSE: 111 mg/dL — AB (ref 70–99)
HCT: 46 % (ref 39.0–52.0)
HEMOGLOBIN: 15.6 g/dL (ref 13.0–17.0)
Potassium: 5.2 mEq/L (ref 3.7–5.3)
Sodium: 141 mEq/L (ref 137–147)
TCO2: 27 mmol/L (ref 0–100)

## 2013-09-23 LAB — CBC
HEMATOCRIT: 42.3 % (ref 39.0–52.0)
HEMOGLOBIN: 13.6 g/dL (ref 13.0–17.0)
MCH: 26.6 pg (ref 26.0–34.0)
MCHC: 32.2 g/dL (ref 30.0–36.0)
MCV: 82.6 fL (ref 78.0–100.0)
Platelets: 254 10*3/uL (ref 150–400)
RBC: 5.12 MIL/uL (ref 4.22–5.81)
RDW: 15.3 % (ref 11.5–15.5)
WBC: 8.6 10*3/uL (ref 4.0–10.5)

## 2013-09-23 LAB — I-STAT TROPONIN, ED: TROPONIN I, POC: 0.01 ng/mL (ref 0.00–0.08)

## 2013-09-23 LAB — TROPONIN I
Troponin I: <0.3 ng/mL (ref <0.30)
Troponin I: <0.3 ng/mL (ref <0.30)

## 2013-09-23 LAB — MAGNESIUM: Magnesium: 2 mg/dL (ref 1.5–2.5)

## 2013-09-23 LAB — APTT: aPTT: 32 seconds (ref 24–37)

## 2013-09-23 MED ORDER — SENNOSIDES-DOCUSATE SODIUM 8.6-50 MG PO TABS: 1.0000 | ORAL_TABLET | Freq: Every evening | ORAL | Status: DC | PRN

## 2013-09-23 MED ORDER — INSULIN ASPART 100 UNIT/ML ~~LOC~~ SOLN: 0.0000 [IU] | Freq: Every day | SUBCUTANEOUS | Status: DC

## 2013-09-23 MED ORDER — ACETAMINOPHEN 325 MG PO TABS: 650.0000 mg | ORAL_TABLET | ORAL | Status: DC | PRN

## 2013-09-23 MED ORDER — EMTRICITABINE-TENOFOVIR DF 200-300 MG PO TABS
1.0000 | ORAL_TABLET | Freq: Every day | ORAL | Status: DC
  Administered 2013-09-24 – 2013-09-26 (×3): 1 via ORAL
  Filled 2013-09-23 (×4): qty 1

## 2013-09-23 MED ORDER — ETOMIDATE 2 MG/ML IV SOLN
INTRAVENOUS | Status: AC
  Administered 2013-09-23: 5 mg via INTRAVENOUS
  Filled 2013-09-23: qty 10

## 2013-09-23 MED ORDER — CLONAZEPAM 1 MG PO TABS
1.0000 mg | ORAL_TABLET | Freq: Every day | ORAL | Status: DC
  Administered 2013-09-23 – 2013-09-25 (×3): 1 mg via ORAL
  Filled 2013-09-23 (×3): qty 1

## 2013-09-23 MED ORDER — ASPIRIN 300 MG RE SUPP
300.0000 mg | RECTAL | Status: AC
  Filled 2013-09-23: qty 1

## 2013-09-23 MED ORDER — FUROSEMIDE 20 MG PO TABS
20.0000 mg | ORAL_TABLET | Freq: Every morning | ORAL | Status: DC
  Administered 2013-09-25 – 2013-09-26 (×2): 20 mg via ORAL
  Filled 2013-09-23 (×3): qty 1

## 2013-09-23 MED ORDER — SODIUM CHLORIDE 0.9 % IV BOLUS (SEPSIS)
1000.0000 mL | Freq: Once | INTRAVENOUS | Status: AC
  Administered 2013-09-23: 1000 mL via INTRAVENOUS

## 2013-09-23 MED ORDER — PANTOPRAZOLE SODIUM 40 MG PO TBEC
40.0000 mg | DELAYED_RELEASE_TABLET | Freq: Every day | ORAL | Status: DC
  Administered 2013-09-24 – 2013-09-26 (×3): 40 mg via ORAL
  Filled 2013-09-23 (×3): qty 1

## 2013-09-23 MED ORDER — MORPHINE SULFATE ER 15 MG PO TBCR
30.0000 mg | EXTENDED_RELEASE_TABLET | Freq: Two times a day (BID) | ORAL | Status: DC
  Administered 2013-09-23 – 2013-09-26 (×6): 30 mg via ORAL
  Filled 2013-09-23 (×6): qty 2

## 2013-09-23 MED ORDER — ALPRAZOLAM 0.25 MG PO TABS
0.2500 mg | ORAL_TABLET | Freq: Two times a day (BID) | ORAL | Status: DC | PRN
  Administered 2013-09-24 – 2013-09-26 (×4): 0.25 mg via ORAL
  Filled 2013-09-23 (×4): qty 1

## 2013-09-23 MED ORDER — EMTRICITAB-RILPIVIR-TENOFOV DF 200-25-300 MG PO TABS: 1.0000 | ORAL_TABLET | Freq: Every day | ORAL | Status: DC

## 2013-09-23 MED ORDER — SODIUM CHLORIDE 0.9 % IJ SOLN
3.0000 mL | Freq: Two times a day (BID) | INTRAMUSCULAR | Status: DC
  Administered 2013-09-23 – 2013-09-26 (×5): 3 mL via INTRAVENOUS

## 2013-09-23 MED ORDER — AMIODARONE IV BOLUS ONLY 150 MG/100ML
150.0000 mg | Freq: Once | INTRAVENOUS | Status: AC
  Administered 2013-09-23: 150 mg via INTRAVENOUS
  Filled 2013-09-23: qty 100

## 2013-09-23 MED ORDER — HEPARIN BOLUS VIA INFUSION
4000.0000 [IU] | Freq: Once | INTRAVENOUS | Status: AC
  Administered 2013-09-23: 4000 [IU] via INTRAVENOUS
  Filled 2013-09-23: qty 4000

## 2013-09-23 MED ORDER — RILPIVIRINE HCL 25 MG PO TABS
25.0000 mg | ORAL_TABLET | Freq: Every day | ORAL | Status: DC
  Administered 2013-09-24 – 2013-09-26 (×3): 25 mg via ORAL
  Filled 2013-09-23 (×4): qty 1

## 2013-09-23 MED ORDER — MORPHINE SULFATE ER 40 MG PO CP24: 40.0000 mg | ORAL_CAPSULE | Freq: Two times a day (BID) | ORAL | Status: DC

## 2013-09-23 MED ORDER — NAPHAZOLINE HCL 0.1 % OP SOLN
1.0000 [drp] | Freq: Four times a day (QID) | OPHTHALMIC | Status: DC | PRN
  Filled 2013-09-23: qty 15

## 2013-09-23 MED ORDER — ETOMIDATE 2 MG/ML IV SOLN
0.3000 mg/kg | Freq: Once | INTRAVENOUS | Status: AC
  Administered 2013-09-23: 5 mg via INTRAVENOUS

## 2013-09-23 MED ORDER — SODIUM CHLORIDE 0.9 % IV SOLN
INTRAVENOUS | Status: DC
Start: 2013-09-23 — End: 2013-09-26
  Administered 2013-09-23: 17:00:00 via INTRAVENOUS

## 2013-09-23 MED ORDER — NORTRIPTYLINE HCL 25 MG PO CAPS
150.0000 mg | ORAL_CAPSULE | Freq: Every day | ORAL | Status: DC
  Administered 2013-09-23 – 2013-09-25 (×3): 150 mg via ORAL
  Filled 2013-09-23 (×4): qty 6

## 2013-09-23 MED ORDER — ZOLPIDEM TARTRATE 5 MG PO TABS: 5.0000 mg | ORAL_TABLET | Freq: Every evening | ORAL | Status: DC | PRN

## 2013-09-23 MED ORDER — IOHEXOL 350 MG/ML SOLN
100.0000 mL | Freq: Once | INTRAVENOUS | Status: AC | PRN
  Administered 2013-09-23: 100 mL via INTRAVENOUS

## 2013-09-23 MED ORDER — INSULIN ASPART 100 UNIT/ML ~~LOC~~ SOLN: 0.0000 [IU] | Freq: Three times a day (TID) | SUBCUTANEOUS | Status: DC

## 2013-09-23 MED ORDER — ONDANSETRON HCL 4 MG/2ML IJ SOLN: 4.0000 mg | Freq: Four times a day (QID) | INTRAMUSCULAR | Status: DC | PRN

## 2013-09-23 MED ORDER — HEPARIN (PORCINE) IN NACL 100-0.45 UNIT/ML-% IJ SOLN
1750.0000 [IU]/h | INTRAMUSCULAR | Status: DC
  Administered 2013-09-23: 1400 [IU]/h via INTRAVENOUS
  Administered 2013-09-24 (×2): 1750 [IU]/h via INTRAVENOUS
  Filled 2013-09-23 (×4): qty 250

## 2013-09-23 MED ORDER — ASPIRIN 81 MG PO CHEW
324.0000 mg | CHEWABLE_TABLET | ORAL | Status: AC
  Administered 2013-09-23: 324 mg via ORAL
  Filled 2013-09-23: qty 4

## 2013-09-23 MED ORDER — NITROGLYCERIN 0.4 MG SL SUBL: 0.4000 mg | SUBLINGUAL_TABLET | SUBLINGUAL | Status: DC | PRN

## 2013-09-23 MED ORDER — ASPIRIN EC 81 MG PO TBEC
81.0000 mg | DELAYED_RELEASE_TABLET | Freq: Every day | ORAL | Status: DC
  Administered 2013-09-25 – 2013-09-26 (×2): 81 mg via ORAL
  Filled 2013-09-23 (×3): qty 1

## 2013-09-23 NOTE — Progress Notes (Signed)
ANTICOAGULATION CONSULT NOTE - Initial Consult  Pharmacy Consult for heparin Indication: chest pain/ACS  Allergies  Allergen Reactions  . Tetracycline Hcl Swelling    Lips, facial swelling    Patient Measurements: Height: 6\' 3"  (190.5 cm) Weight: 238 lb 8.6 oz (108.2 kg) IBW/kg (Calculated) : 84.5 Heparin Dosing Weight: 100 kg  Vital Signs: Temp: 98.1 F (36.7 C) (03/23 1530) Temp src: Oral (03/23 1530) BP: 96/61 mmHg (03/23 1600) Pulse Rate: 74 (03/23 1430)  Labs:  Recent Labs  09/23/13 1020 09/23/13 1025  HGB 13.6 15.6  HCT 42.3 46.0  PLT 254  --   APTT 32  --   LABPROT 13.5  --   INR 1.05  --   CREATININE 1.12 1.30    Estimated Creatinine Clearance: 87.4 ml/min (by C-G formula based on Cr of 1.3).   Medical History: Past Medical History  Diagnosis Date  . Chronic hepatitis B   . HIV (human immunodeficiency virus infection)   . Cholesteatoma   . Depression   . Marfan syndrome     Dr Ninetta LightsHatcher follows  . Venous insufficiency (chronic) (peripheral)   . Mitral valve prolapse     Medications:  Scheduled:  . aspirin  324 mg Oral NOW   Or  . aspirin  300 mg Rectal NOW  . [START ON 09/24/2013] aspirin EC  81 mg Oral Daily  . clonazePAM  1 mg Oral QHS  . Emtricitab-Rilpivir-Tenofovir  1 tablet Oral QHS  . [START ON 09/24/2013] furosemide  20 mg Oral q morning - 10a  . insulin aspart  0-5 Units Subcutaneous QHS  . insulin aspart  0-9 Units Subcutaneous TID WC  . Morphine Sulfate  40 mg Oral BID  . nortriptyline  150 mg Oral QHS  . [START ON 09/24/2013] pantoprazole  40 mg Oral Daily   Infusions:    Assessment: 53 yo male with chest pain will be started on heparin therapy.  Patient was not on any anticoagulation at home. Baseline Hgb 13.6 and Plt 254 K. INR 1.05 Goal of Therapy:  Heparin level 0.3-0.7 units/ml Monitor platelets by anticoagulation protocol: Yes   Plan:  1) Heparin 4000 units iv bolus x1, then start heparin drip at 1400 units/hr. 2)  6hr heparin level  3) Daily heparin level and CBC  Nelline Lio, Tsz-Yin 09/23/2013,4:22 PM

## 2013-09-23 NOTE — ED Notes (Signed)
Patient cardioverted per Dr. Rubin PayorPickering. Patient's rhythm now Atrial fib with occasional PVC.

## 2013-09-23 NOTE — ED Provider Notes (Signed)
CSN: [id number]     Arrival date & time 09/23/13  7829 History   First MD Initiated Contact with Patient 09/23/13 1006     Chief Complaint  Patient presents with  . Tachycardia  . Hypotension     (Consider location/radiation/quality/duration/timing/severity/associated sxs/prior Treatment) The history is provided by the patient.   level V caveat due to urgent need for intervention  Patient presented with chest pain. States he's had on and off for the last couple days, but worse since around hour and a half prior to arrival. States she's felt fatigued. States he feels his heart going fast. He states he felt some pain last night without heart racing. He is a history of Marfan syndrome. No known coronary disease. He has previously been on a beta blocker, but does not know why. Past Medical History  Diagnosis Date  . Chronic hepatitis B   . HIV (human immunodeficiency virus infection)   . Cholesteatoma   . Depression   . Marfan syndrome     Dr Ninetta Lights follows  . Venous insufficiency (chronic) (peripheral)   . Mitral valve prolapse    Past Surgical History  Procedure Laterality Date  . Cholesteotoma removal    . Appendectomy    . Repair of perforated colon     Family History  Problem Relation Age of Onset  . Cancer Mother   . Stroke Sister   . Hypertension Sister   . Hypertension Mother    History  Substance Use Topics  . Smoking status: Former Smoker -- 0.75 packs/day for 35 years    Types: Cigarettes  . Smokeless tobacco: Never Used     Comment: congratulated! using e-cigarettes  . Alcohol Use: No    Review of Systems  Unable to perform ROS Cardiovascular: Positive for chest pain.  Neurological: Positive for light-headedness.      Allergies  Tetracycline hcl  Home Medications   No current outpatient prescriptions on file. BP 105/70  Pulse 74  Temp(Src) 98.1 F (36.7 C) (Oral)  Resp 14  Ht 6\' 3"  (1.905 m)  Wt 238 lb 8.6 oz (108.2 kg)  BMI 29.82 kg/m2   SpO2 98% Physical Exam  Constitutional: He is oriented to person, place, and time. He appears well-nourished.  HENT:  Right Ear: External ear normal.  Eyes: Pupils are equal, round, and reactive to light.  Cardiovascular:  Severe tachycardia  Pulmonary/Chest: Effort normal and breath sounds normal.  Abdominal: Soft. There is no tenderness.  Musculoskeletal: He exhibits edema.  Mild bilateral lower extremity pitting edema  Neurological: He is alert and oriented to person, place, and time.  Skin: Skin is warm.    ED Course  CARDIOVERSION Date/Time: 09/23/2013 10:30 AM Performed by: Benjiman Core R. Authorized by: Billee Cashing Consent: Verbal consent obtained. written consent not obtained. The procedure was performed in an emergent situation. Risks and benefits: risks, benefits and alternatives were discussed Consent given by: patient Patient understanding: patient states understanding of the procedure being performed Patient identity confirmed: verbally with patient and arm band Time out: Immediately prior to procedure a "time out" was called to verify the correct patient, procedure, equipment, support staff and site/side marked as required. Patient sedated: yes Sedation type: moderate (conscious) sedation Sedatives: etomidate Vitals: Vital signs were monitored during sedation. (5 minutes as sedation with 5 mg of etomidate.) Cardioversion basis: emergent Pre-procedure rhythm: ventricular tachycardia Patient position: patient was placed in a supine position Chest area: chest area exposed Electrodes: pads Electrodes placed: anterior-posterior Number  of attempts: 1 Attempt 1 mode: synchronous Attempt 1 waveform: biphasic Attempt 1 shock (in Joules): 120 Attempt 1 outcome: conversion to normal sinus rhythm Post-procedure rhythm: normal sinus rhythm Complications: no complications Patient tolerance: Patient tolerated the procedure well with no immediate  complications.   (including critical care time) Labs Review Labs Reviewed  BASIC METABOLIC PANEL - Abnormal; Notable for the following:    Glucose, Bld 114 (*)    GFR calc non Af Amer 73 (*)    GFR calc Af Amer 85 (*)    All other components within normal limits  I-STAT CHEM 8, ED - Abnormal; Notable for the following:    BUN 32 (*)    Glucose, Bld 111 (*)    All other components within normal limits  MRSA PCR SCREENING  CBC  PROTIME-INR  APTT  MAGNESIUM  I-STAT TROPOININ, ED   Imaging Review Dg Chest Port 1 View  09/23/2013   CLINICAL DATA:  Tachycardia, hypotension.  EXAM: PORTABLE CHEST - 1 VIEW  COMPARISON:  05/28/2013  FINDINGS: Cardiomegaly. I am increasing interstitial prominence throughout the lungs, most pronounced in the lower lobe suggesting interstitial edema. No confluent opacity or visible effusions. No acute bony abnormality.  IMPRESSION: Cardiomegaly with increasing interstitial prominence, likely interstitial edema.   Electronically Signed   By: Charlett Nose M.D.   On: 09/23/2013 10:47   Ct Angio Chest Aortic Dissect W &/or W/o  09/23/2013   CLINICAL DATA:  Chest pain. Gerome Apley; Marfans Chest pain  EXAM: CT ANGIOGRAPHY CHEST, ABDOMEN AND PELVIS  TECHNIQUE: Multidetector CT imaging through the chest, abdomen and pelvis was performed using the standard protocol during bolus administration of intravenous contrast. Multiplanar reconstructed images and MIPs were obtained and reviewed to evaluate the vascular anatomy.  CONTRAST:  OMNIPAQUE IOHEXOL 350 MG/ML SOLN  COMPARISON:  CT ANGIO CHEST W/CM &/OR WO/CM dated 01/23/2011; CT ABD W/CM dated 05/09/2005  FINDINGS: CTA CHEST FINDINGS  The thoracic inlet is unremarkable.  There is no evidence of mediastinal nor hilar adenopathy nor masses.  There is no evidence of a thoracic aortic aneurysm nor dissection.  Extensive centrilobular and paraseptal emphysematous changes in the upper lobes. There has been decreased conspicuity  of the bibasilar atelectasis and/or scarring. No new focal regions of consolidation or focal infiltrates. The central airways are patent.  Review of the MIP images confirms the above findings.  CTA ABDOMEN AND PELVIS FINDINGS  There is no evidence of an abdominal aortic aneurysm nor dissection. Atherosclerotic calcifications are appreciated within the abdominal aorta and iliac vessels. The celiac, SMA, IMA, portal vein, and SMV are opacified.  The liver is atrophic with a reticular parenchymal pattern and a diffuse nodular border. The spleen is enlarged, stable. Varices within the splenic hilum.  The adrenals and kidneys are unremarkable.  There is no evidence calcified gallstones. There is no evidence of abdominal or pelvic free fluid, loculated fluid collections, masses, nor adenopathy.  There is no evidence of bowel obstruction, enteritis, colitis, nor diverticulitis. A moderate to large amount of stool is appreciated within the colon. Compare to prior study dated 05/09/2005 there is stable common bile duct dilation.  There has been a component of fatty involution of the pancreas which is otherwise unremarkable.  There is no evidence of abdominal wall nor inguinal hernia.  There is no evidence of aggressive appearing osseous lesions.  Review of the MIP images confirms the above findings.  IMPRESSION: 1. There is no evidence of a thoracic nor abdominal  aortic aneurysm or dissection. 2. Cirrhotic changes within the liver and portal venous hypertension 3. Extensive emphysematous changes within the lungs 4. Bibasilar scarring and/or atelectasis within the lungs 5. Moderate amount of fecal retention 6. Otherwise no evidence of obstructive or inflammatory abnormalities, nor CT etiology for the patient's signs and symptoms.   Electronically Signed   By: Salome HolmesHector  Cooper M.D.   On: 09/23/2013 12:49   Ct Cta Abd/pel W/cm &/or W/o Cm  09/23/2013   CLINICAL DATA:  Chest pain. Gerome ApleyVtach, marfans; Marfans Chest pain  EXAM: CT  ANGIOGRAPHY CHEST, ABDOMEN AND PELVIS  TECHNIQUE: Multidetector CT imaging through the chest, abdomen and pelvis was performed using the standard protocol during bolus administration of intravenous contrast. Multiplanar reconstructed images and MIPs were obtained and reviewed to evaluate the vascular anatomy.  CONTRAST:  100mL OMNIPAQUE IOHEXOL 350 MG/ML SOLN  COMPARISON:  CT ANGIO CHEST W/CM &/OR WO/CM dated 01/23/2011; CT ABD W/CM dated 05/09/2005  FINDINGS: CTA CHEST FINDINGS  The thoracic inlet is unremarkable.  There is no evidence of mediastinal nor hilar adenopathy nor masses.  There is no evidence of a thoracic aortic aneurysm nor dissection.  Extensive centrilobular and paraseptal emphysematous changes in the upper lobes. There has been decreased conspicuity of the bibasilar atelectasis and/or scarring. No new focal regions of consolidation or focal infiltrates. The central airways are patent.  Review of the MIP images confirms the above findings.  CTA ABDOMEN AND PELVIS FINDINGS  There is no evidence of an abdominal aortic aneurysm nor dissection. Atherosclerotic calcifications are appreciated within the abdominal aorta and iliac vessels. The celiac, SMA, IMA, portal vein, and SMV are opacified.  The liver is atrophic with a reticular parenchymal pattern and a diffuse nodular border. The spleen is enlarged, stable. Varices within the splenic hilum.  The adrenals and kidneys are unremarkable.  There is no evidence calcified gallstones. There is no evidence of abdominal or pelvic free fluid, loculated fluid collections, masses, nor adenopathy.  There is no evidence of bowel obstruction, enteritis, colitis, nor diverticulitis. A moderate to large amount of stool is appreciated within the colon. Compare to prior study dated 05/09/2005 there is stable common bile duct dilation.  There has been a component of fatty involution of the pancreas which is otherwise unremarkable.  There is no evidence of abdominal wall  nor inguinal hernia.  There is no evidence of aggressive appearing osseous lesions.  Review of the MIP images confirms the above findings.  IMPRESSION: 1. There is no evidence of a thoracic nor abdominal aortic aneurysm or dissection. 2. Cirrhotic changes within the liver and portal venous hypertension 3. Extensive emphysematous changes within the lungs 4. Bibasilar scarring and/or atelectasis within the lungs 5. Moderate amount of fecal retention 6. Otherwise no evidence of obstructive or inflammatory abnormalities, nor CT etiology for the patient's signs and symptoms.   Electronically Signed   By: Salome HolmesHector  Cooper M.D.   On: 09/23/2013 12:49     EKG Interpretation   Date/Time:  Monday September 23 2013 10:49:04 EDT Ventricular Rate:  100 PR Interval:  193 QRS Duration: 143 QT Interval:  357 QTC Calculation: 460 R Axis:   26 Text Interpretation:  Sinus tachycardia Ventricular premature complex  Consider left atrial enlargement IVCD, consider atypical LBBB ST depresson  inferiorly and laterally. may be strain pattern Confirmed by Rubin PayorPICKERING   MD, Juaquina Machnik [phone]([id number]) on 09/23/2013 12:20:13 PM      Date: 09/23/2013  10:04  Rate: 196  Rhythm: ventricular tachycardia  QRS  Axis: normal  Intervals: Vtach  ST/T Wave abnormalities: Vtach  Conduction Disutrbances:vtach  Narrative Interpretation:   Old EKG Reviewed: changes noted    MDM   Final diagnoses:  Ventricular tachycardia    Patient presented and ventricular tachycardia. He was moderately stable, his blood pressures were marginal. I discuss with cardiology, Dr. Eldridge Dace, who discussed with electrophysiology. There is no improvement with amiodarone. He required cardioversion. He converted back to sinus rhythm. CT scan was done due to to his history of Marfan syndrome. He remained in sinus rhythm. He'll be transferred to the CCU at [facility].  CRITICAL CARE Performed by: Billee Cashing Total critical care time: 30 Critical care  time was exclusive of separately billable procedures and treating other patients. Critical care was necessary to treat or prevent imminent or life-threatening deterioration. Critical care was time spent personally by me on the following activities: development of treatment plan with patient and/or surrogate as well as nursing, discussions with consultants, evaluation of patient's response to treatment, examination of patient, obtaining history from patient or surrogate, ordering and performing treatments and interventions, ordering and review of laboratory studies, ordering and review of radiographic studies, pulse oximetry and re-evaluation of patient's condition.       Juliet Rude. Rubin Payor, MD 09/23/13 1553

## 2013-09-23 NOTE — ED Notes (Addendum)
Pt states he began to feel bad last night with chest pain and dizziness. Pt states he has had two episodes since then that come and go. Pt has hx of Marfans and HIV. Pt states he has been on beta blocker for heart rate.

## 2013-09-23 NOTE — ED Notes (Signed)
Patient's rhythm currently SR with occasional PVC- rate 99.

## 2013-09-23 NOTE — Progress Notes (Signed)
Utilization Review Completed.Damyn Weitzel T3/23/2015  

## 2013-09-23 NOTE — ED Notes (Signed)
[facility] notified of need for transport to [facility].
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
## 2013-09-24 ENCOUNTER — Encounter (HOSPITAL_COMMUNITY)
Admission: EM | Disposition: A | Discharge status: Home / Self Care | Payer: Self-pay | Source: Home / Self Care | Attending: Interventional Cardiology

## 2013-09-24 DIAGNOSIS — I4729 Other ventricular tachycardia: Secondary | ICD-10-CM

## 2013-09-24 DIAGNOSIS — I472 Ventricular tachycardia: Secondary | ICD-10-CM

## 2013-09-24 HISTORY — PX: LEFT HEART CATHETERIZATION WITH CORONARY ANGIOGRAM: SHX5451

## 2013-09-24 LAB — CBC
HCT: 51.9 % (ref 39.0–52.0)
HCT: 36.6 % — ABNORMAL LOW (ref 39.0–52.0)
HEMOGLOBIN: 17 g/dL (ref 13.0–17.0)
HEMOGLOBIN: 12.1 g/dL — AB (ref 13.0–17.0)
MCH: 27.1 pg (ref 26.0–34.0)
MCH: 27.4 pg (ref 26.0–34.0)
MCHC: 32.8 g/dL (ref 30.0–36.0)
MCHC: 33.1 g/dL (ref 30.0–36.0)
MCV: 82.6 fL (ref 78.0–100.0)
MCV: 83 fL (ref 78.0–100.0)
PLATELETS: 108 10*3/uL — AB (ref 150–400)
Platelets: 180 10*3/uL (ref 150–400)
RBC: 6.28 MIL/uL — AB (ref 4.22–5.81)
RBC: 4.41 MIL/uL (ref 4.22–5.81)
RDW: 15.6 % — ABNORMAL HIGH (ref 11.5–15.5)
RDW: 15.4 % (ref 11.5–15.5)
WBC: 6.6 10*3/uL (ref 4.0–10.5)
WBC: 5.2 10*3/uL (ref 4.0–10.5)

## 2013-09-24 LAB — BASIC METABOLIC PANEL
BUN: 16 mg/dL (ref 6–23)
CALCIUM: 9.2 mg/dL (ref 8.4–10.5)
CHLORIDE: 101 meq/L (ref 96–112)
CO2: 22 meq/L (ref 19–32)
Creatinine, Ser: 0.83 mg/dL (ref 0.50–1.35)
GFR calc Af Amer: >90 mL/min (ref >90)
GFR calc non Af Amer: >90 mL/min (ref >90)
GLUCOSE: 81 mg/dL (ref 70–99)
POTASSIUM: 5.5 meq/L — AB (ref 3.7–5.3)
SODIUM: 138 meq/L (ref 137–147)

## 2013-09-24 LAB — LIPID PANEL
CHOL/HDL RATIO: 3.6 ratio
Cholesterol: 148 mg/dL (ref 0–200)
HDL: 41 mg/dL (ref >39)
LDL CALC: 83 mg/dL (ref 0–99)
Triglycerides: 121 mg/dL (ref <150)
VLDL: 24 mg/dL (ref 0–40)

## 2013-09-24 LAB — TROPONIN I: Troponin I: <0.3 ng/mL (ref <0.30)

## 2013-09-24 LAB — HEPARIN LEVEL (UNFRACTIONATED)
Heparin Unfractionated: <0.1 IU/mL — ABNORMAL LOW (ref 0.30–0.70)
Heparin Unfractionated: 0.13 IU/mL — ABNORMAL LOW (ref 0.30–0.70)

## 2013-09-24 LAB — TSH: TSH: 1.026 u[IU]/mL (ref 0.350–4.500)

## 2013-09-24 SURGERY — LEFT HEART CATHETERIZATION WITH CORONARY ANGIOGRAM
Anesthesia: LOCAL

## 2013-09-24 MED ORDER — HEPARIN (PORCINE) IN NACL 100-0.45 UNIT/ML-% IJ SOLN: 2100.0000 [IU]/h | INTRAMUSCULAR | Status: DC

## 2013-09-24 MED ORDER — SODIUM CHLORIDE 0.9 % IJ SOLN
3.0000 mL | Freq: Two times a day (BID) | INTRAMUSCULAR | Status: DC
  Administered 2013-09-24: 3 mL via INTRAVENOUS

## 2013-09-24 MED ORDER — SODIUM CHLORIDE 0.9 % IV SOLN: 1.0000 mL/kg/h | INTRAVENOUS | Status: DC

## 2013-09-24 MED ORDER — HEPARIN BOLUS VIA INFUSION
2000.0000 [IU] | Freq: Once | INTRAVENOUS | Status: AC
  Administered 2013-09-24: 2000 [IU] via INTRAVENOUS
  Filled 2013-09-24: qty 2000

## 2013-09-24 MED ORDER — SODIUM CHLORIDE 0.9 % IV SOLN: 250.0000 mL | INTRAVENOUS | Status: DC | PRN

## 2013-09-24 MED ORDER — SODIUM CHLORIDE 0.9 % IJ SOLN: 3.0000 mL | INTRAMUSCULAR | Status: DC | PRN

## 2013-09-24 MED ORDER — MIDAZOLAM HCL 2 MG/2ML IJ SOLN
INTRAMUSCULAR | Status: AC
  Filled 2013-09-24: qty 2

## 2013-09-24 MED ORDER — SODIUM CHLORIDE 0.9 % IV SOLN: 1.0000 mL/kg/h | INTRAVENOUS | Status: AC

## 2013-09-24 MED ORDER — VERAPAMIL HCL 2.5 MG/ML IV SOLN
INTRAVENOUS | Status: AC
  Filled 2013-09-24: qty 2

## 2013-09-24 MED ORDER — HEPARIN (PORCINE) IN NACL 2-0.9 UNIT/ML-% IJ SOLN
INTRAMUSCULAR | Status: AC
  Filled 2013-09-24: qty 1000

## 2013-09-24 MED ORDER — LIDOCAINE HCL (PF) 1 % IJ SOLN
INTRAMUSCULAR | Status: AC
  Filled 2013-09-24: qty 30

## 2013-09-24 MED ORDER — NITROGLYCERIN 0.2 MG/ML ON CALL CATH LAB
INTRAVENOUS | Status: AC
  Filled 2013-09-24: qty 1

## 2013-09-24 MED ORDER — HEPARIN SODIUM (PORCINE) 1000 UNIT/ML IJ SOLN
INTRAMUSCULAR | Status: AC
  Filled 2013-09-24: qty 1

## 2013-09-24 MED ORDER — ASPIRIN 81 MG PO CHEW
CHEWABLE_TABLET | ORAL | Status: AC
  Administered 2013-09-24: 81 mg via ORAL
  Filled 2013-09-24: qty 1

## 2013-09-24 MED ORDER — FENTANYL CITRATE 0.05 MG/ML IJ SOLN
INTRAMUSCULAR | Status: AC
  Filled 2013-09-24: qty 2

## 2013-09-24 MED ORDER — ASPIRIN 81 MG PO CHEW
81.0000 mg | CHEWABLE_TABLET | ORAL | Status: AC
  Administered 2013-09-24: 81 mg via ORAL

## 2013-09-24 MED ORDER — ASPIRIN 81 MG PO CHEW: 81.0000 mg | CHEWABLE_TABLET | ORAL | Status: DC

## 2013-09-24 NOTE — Progress Notes (Signed)
ANTICOAGULATION CONSULT NOTE - Follow-Up Consult  Pharmacy Consult for heparin Indication: chest pain/ACS  Allergies  Allergen Reactions  . Tetracycline Hcl Swelling    Lips, facial swelling    Patient Measurements: Height: 6\' 3"  (190.5 cm) Weight: 239 lb 13.8 oz (108.8 kg) IBW/kg (Calculated) : 84.5 Heparin Dosing Weight: 100 kg  Vital Signs: Temp: 98.2 F (36.8 C) (03/24 1136) Temp src: Oral (03/24 1136) BP: 127/81 mmHg (03/24 1136)  Labs:  Recent Labs  09/23/13 1020 09/23/13 1025 09/23/13 1707 09/23/13 2140 09/23/13 2323 09/24/13 0840 09/24/13 1215  HGB 13.6 15.6  --   --   --  17.0 12.1*  HCT 42.3 46.0  --   --   --  51.9 36.6*  PLT 254  --   --   --   --  108* 180  APTT 32  --   --   --   --   --   --   LABPROT 13.5  --   --   --   --   --   --   INR 1.05  --   --   --   --   --   --   HEPARINUNFRC  --   --   --   --  <0.10* 0.13*  --   CREATININE 1.12 1.30  --   --   --  0.83  --   TROPONINI  --   --  <0.30 <0.30  --  <0.30  --     Estimated Creatinine Clearance: 137.1 ml/min (by C-G formula based on Cr of 0.83).   Medical History: Past Medical History  Diagnosis Date  . Chronic hepatitis B   . HIV (human immunodeficiency virus infection)   . Cholesteatoma   . Depression   . Marfan syndrome     Dr Ninetta LightsHatcher follows  . Venous insufficiency (chronic) (peripheral)   . Mitral valve prolapse     Medications:  Scheduled:  . aspirin EC  81 mg Oral Daily  . clonazePAM  1 mg Oral QHS  . emtricitabine-tenofovir  1 tablet Oral Q breakfast  . furosemide  20 mg Oral q morning - 10a  . morphine  30 mg Oral Q12H  . nortriptyline  150 mg Oral QHS  . pantoprazole  40 mg Oral Daily  . rilpivirine  25 mg Oral Q breakfast  . sodium chloride  3 mL Intravenous Q12H  . sodium chloride  3 mL Intravenous Q12H   Infusions:  . sodium chloride 10 mL/hr at 09/23/13 1706  . sodium chloride 1 mL/kg/hr (09/24/13 1127)    Assessment: 53 yo male admitted on 3/23 with  chest pain, dizziness, and SOB. Pharmacy was consulted to dose IV heparin for chest pain/ACS. Heparin level this AM continues to be subtherapeutic at 0.13 (no problems with IV per nurse). The level was drawn around 0840 but did not result until 1130. Patient was originally scheduled for cath at 1200, but nurse reported patient would not go for another couple of hours. Heparin rate was increased to 2100units/hr, but platelet level came back at 108 (decrease from 254). Dr. Royann Shiversroitoru discontinued the IV heparin for now. Hg 12.1, platelets 180. No s/s of bleeding noted.   Goal of Therapy:  Heparin level 0.3-0.7 units/ml Monitor platelets by anticoagulation protocol: Yes   Plan:  - Follow-up patient post-cath for restart of heparin - Monitor daily CBC, heparin level (if needed), and s/s of bleeding  Iridiana Fonner A. Lenon AhmadiBinz, PharmD Clinical Pharmacist -  Resident Pager: [phone] Pharmacy: [phone] 09/24/2013 1:20 PM

## 2013-09-24 NOTE — Interval H&P Note (Signed)
History and Physical Interval Note:  09/24/2013 3:54 PM  Johnathan LarsenGary W [name]  has presented today for surgery, with the diagnosis of CP  The various methods of treatment have been discussed with the patient and family. After consideration of risks, benefits and other options for treatment, the patient has consented to  Procedure(s): LEFT HEART CATHETERIZATION WITH CORONARY ANGIOGRAM ([name]) as a surgical intervention .  The patient's history has been reviewed, patient examined, no change in status, stable for surgery.  I have reviewed the patient's chart and labs.  Questions were answered to the patient's satisfaction.    Cath Lab Visit (complete for each Cath Lab visit)  Clinical Evaluation Leading to the Procedure:   ACS: no  Non-ACS:    Anginal Classification: CCS II  Anti-ischemic medical therapy: No Therapy  Non-Invasive Test Results: No non-invasive testing performed  Prior CABG: No previous CABG       Theron Aristaeter [facility] Center LtdJordanMD,FACC 09/24/2013 3:54 PM

## 2013-09-24 NOTE — Progress Notes (Signed)
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   CURRENT MEDS . aspirin EC  81 mg Oral Daily  . clonazePAM  1 mg Oral QHS  . emtricitabine-tenofovir  1 tablet Oral Q breakfast  . furosemide  20 mg Oral q morning - 10a  . morphine  30 mg Oral Q12H  . nortriptyline  150 mg Oral QHS  . pantoprazole  40 mg Oral Daily  . rilpivirine  25 mg Oral Q breakfast  . sodium chloride  3 mL Intravenous Q12H    OBJECTIVE   Intake/Output Summary (Last 24 hours) at 09/24/13 0849 Last data filed at 09/24/13 0600  Gross per 24 hour  Intake 534.53 ml  Output    500 ml  Net  34.53 ml   Filed Weights   09/23/13 1035 09/23/13 1530 09/24/13 0500  Weight: 107.5 kg (236 lb 15.9 oz) 108.2 kg (238 lb 8.6 oz) 108.8 kg (239 lb 13.8 oz)    PHYSICAL EXAM Filed Vitals:   09/24/13 0400 09/24/13 0500 09/24/13 0600 09/24/13 0735  BP: 99/61 106/65 120/71 106/74  Pulse:      Temp:   98 F (36.7 C) 97.9 F (36.6 C)  TempSrc:   Oral Oral  Resp: 12 13 19 12   Height:      Weight:  108.8 kg (239 lb 13.8 oz)    SpO2:   98% 96%   General: Alert, oriented x3, no distress, Marfanoid Head: no evidence of trauma, PERRL, EOMI, no exophtalmos or lid lag, no myxedema, no xanthelasma; normal ears, nose and oropharynx Neck: normal jugular venous pulsations and no hepatojugular reflux; brisk carotid pulses without delay and no carotid bruits Chest: clear to auscultation, no signs of  consolidation by percussion or palpation, normal fremitus, symmetrical and full respiratory excursions Cardiovascular: normal position and quality of the apical impulse, regular rhythm, normal first and second heart sounds, no rubs or gallops,  General: Alert, oriented x3, no distress Head: no evidence of trauma, PERRL, EOMI, no exophtalmos or lid lag, no myxedema, no xanthelasma; normal ears, nose and oropharynx Neck: normal jugular venous pulsations and no hepatojugular reflux; brisk carotid pulses without delay and no carotid bruits Chest: clear to auscultation, no signs of consolidation by percussion or palpation, normal fremitus, symmetrical and full respiratory excursions Cardiovascular: normal position and quality of the apical impulse, regular rhythm, normal first and second heart sounds, no murmurs, rubs or gallops Abdomen: no tenderness or distention, no masses by palpation, no abnormal pulsatility or arterial bruits, normal bowel sounds, no hepatosplenomegaly Extremities: no clubbing, cyanosis or edema; 2+ radial, ulnar and brachial pulses bilaterally; 2+ right femoral, posterior tibial and dorsalis pedis pulses; 2+ left femoral, posterior tibial and dorsalis pedis pulses; no subclavian or femoral bruits Neurological: grossly nonfocal  murmur Abdomen: no tenderness or distention, no masses by palpation, no abnormal pulsatility or arterial bruits, normal bowel sounds, no hepatosplenomegaly Extremities: no clubbing, cyanosis or edema;  2+ radial, ulnar and brachial pulses bilaterally; 2+ right femoral, posterior tibial and dorsalis pedis pulses; 2+ left femoral, posterior tibial and dorsalis pedis pulses; no subclavian or femoral bruits Neurological: grossly nonfocal  LABS  CBC  Recent Labs  09/23/13 1020 09/23/13 1025  WBC 8.6  --   HGB 13.6 15.6  HCT 42.3 46.0  MCV 82.6  --   PLT 254  --    Basic Metabolic Panel  Recent Labs  09/23/13 1020 09/23/13 1025  NA 139 141  K  5.0 5.2  CL 99 102  CO2 24  --   GLUCOSE 114* 111*  BUN 23 32*  CREATININE 1.12 1.30  CALCIUM 10.3  --   MG 2.0  --    Liver Function Tests No results found for this basename: AST, ALT, ALKPHOS, BILITOT, PROT, ALBUMIN,  in the last 72 hours No results found for this basename: LIPASE, AMYLASE,  in the last 72 hours Cardiac Enzymes  Recent Labs  09/23/13 1707 09/23/13 2140  TROPONINI <0.30 <0.30   Recent Labs  09/23/13 1707  TSH 1.026    Radiology Studies Imaging results have been reviewed and Dg Chest Port 1 View  09/23/2013   CLINICAL DATA:  Tachycardia, hypotension.  EXAM: PORTABLE CHEST - 1 VIEW  COMPARISON:  05/28/2013  FINDINGS: Cardiomegaly. I am increasing interstitial prominence throughout the lungs, most pronounced in the lower lobe suggesting interstitial edema. No confluent opacity or visible effusions. No acute bony abnormality.  IMPRESSION: Cardiomegaly with increasing interstitial prominence, likely interstitial edema.   Electronically Signed   By: Charlett Nose M.D.   On: 09/23/2013 10:47   Ct Angio Chest Aortic Dissect W &/or W/o  09/23/2013   CLINICAL DATA:  Chest pain. Gerome Apley; Marfans Chest pain  EXAM: CT ANGIOGRAPHY CHEST, ABDOMEN AND PELVIS  TECHNIQUE: Multidetector CT imaging through the chest, abdomen and pelvis was performed using the standard protocol during bolus administration of intravenous contrast. Multiplanar reconstructed images and MIPs were obtained and reviewed to evaluate the vascular anatomy.  CONTRAST:  OMNIPAQUE IOHEXOL 350 MG/ML SOLN  COMPARISON:  CT ANGIO CHEST W/CM &/OR WO/CM dated 01/23/2011; CT ABD W/CM dated 05/09/2005  FINDINGS: CTA CHEST FINDINGS  The thoracic inlet is unremarkable.  There is no evidence of mediastinal nor hilar adenopathy nor masses.  There is no evidence of a thoracic aortic aneurysm nor dissection.  Extensive centrilobular and paraseptal emphysematous changes in the upper lobes. There has been decreased  conspicuity of the bibasilar atelectasis and/or scarring. No new focal regions of consolidation or focal infiltrates. The central airways are patent.  Review of the MIP images confirms the above findings.  CTA ABDOMEN AND PELVIS FINDINGS  There is no evidence of an abdominal aortic aneurysm nor dissection. Atherosclerotic calcifications are appreciated within the abdominal aorta and iliac vessels. The celiac, SMA, IMA, portal vein, and SMV are opacified.  The liver is atrophic with a reticular parenchymal pattern and a diffuse nodular border. The spleen is enlarged, stable. Varices within the splenic hilum.  The adrenals and kidneys are unremarkable.  There is no evidence calcified gallstones. There is no evidence of abdominal or pelvic free fluid, loculated fluid collections, masses, nor adenopathy.  There is no evidence of bowel obstruction, enteritis, colitis, nor diverticulitis. A moderate to large amount of stool is appreciated within the colon. Compare to prior study dated 05/09/2005 there is stable common bile duct dilation.  There has been a component of fatty involution of the pancreas which  is otherwise unremarkable.  There is no evidence of abdominal wall nor inguinal hernia.  There is no evidence of aggressive appearing osseous lesions.  Review of the MIP images confirms the above findings.  IMPRESSION: 1. There is no evidence of a thoracic nor abdominal aortic aneurysm or dissection. 2. Cirrhotic changes within the liver and portal venous hypertension 3. Extensive emphysematous changes within the lungs 4. Bibasilar scarring and/or atelectasis within the lungs 5. Moderate amount of fecal retention 6. Otherwise no evidence of obstructive or inflammatory abnormalities, nor CT etiology for the patient's signs and symptoms.   Electronically Signed   By: Salome HolmesHector  Cooper M.D.   On: 09/23/2013 12:49   Ct Cta Abd/pel W/cm &/or W/o Cm  09/23/2013   CLINICAL DATA:  Chest pain. Gerome ApleyVtach, marfans; Marfans Chest pain   EXAM: CT ANGIOGRAPHY CHEST, ABDOMEN AND PELVIS  TECHNIQUE: Multidetector CT imaging through the chest, abdomen and pelvis was performed using the standard protocol during bolus administration of intravenous contrast. Multiplanar reconstructed images and MIPs were obtained and reviewed to evaluate the vascular anatomy.  CONTRAST:  100mL OMNIPAQUE IOHEXOL 350 MG/ML SOLN  COMPARISON:  CT ANGIO CHEST W/CM &/OR WO/CM dated 01/23/2011; CT ABD W/CM dated 05/09/2005  FINDINGS: CTA CHEST FINDINGS  The thoracic inlet is unremarkable.  There is no evidence of mediastinal nor hilar adenopathy nor masses.  There is no evidence of a thoracic aortic aneurysm nor dissection.  Extensive centrilobular and paraseptal emphysematous changes in the upper lobes. There has been decreased conspicuity of the bibasilar atelectasis and/or scarring. No new focal regions of consolidation or focal infiltrates. The central airways are patent.  Review of the MIP images confirms the above findings.  CTA ABDOMEN AND PELVIS FINDINGS  There is no evidence of an abdominal aortic aneurysm nor dissection. Atherosclerotic calcifications are appreciated within the abdominal aorta and iliac vessels. The celiac, SMA, IMA, portal vein, and SMV are opacified.  The liver is atrophic with a reticular parenchymal pattern and a diffuse nodular border. The spleen is enlarged, stable. Varices within the splenic hilum.  The adrenals and kidneys are unremarkable.  There is no evidence calcified gallstones. There is no evidence of abdominal or pelvic free fluid, loculated fluid collections, masses, nor adenopathy.  There is no evidence of bowel obstruction, enteritis, colitis, nor diverticulitis. A moderate to large amount of stool is appreciated within the colon. Compare to prior study dated 05/09/2005 there is stable common bile duct dilation.  There has been a component of fatty involution of the pancreas which is otherwise unremarkable.  There is no evidence of  abdominal wall nor inguinal hernia.  There is no evidence of aggressive appearing osseous lesions.  Review of the MIP images confirms the above findings.  IMPRESSION: 1. There is no evidence of a thoracic nor abdominal aortic aneurysm or dissection. 2. Cirrhotic changes within the liver and portal venous hypertension 3. Extensive emphysematous changes within the lungs 4. Bibasilar scarring and/or atelectasis within the lungs 5. Moderate amount of fecal retention 6. Otherwise no evidence of obstructive or inflammatory abnormalities, nor CT etiology for the patient's signs and symptoms.   Electronically Signed   By: Salome HolmesHector  Cooper M.D.   On: 09/23/2013 12:49    TELE No further sustained VT, frequent PVCs, rare couplets/3-beat runs (morphology appears distinct from recorded VT)  ECG NSR, PVC, LVH, QRS 122 ms, QTC 473 ms.  ASSESSMENT AND PLAN Symptomatic sustained VT  I find no evidence of AFib in any of the available ECGs  or tele tracings Coronary angio and echo today EP consultation IV amiodarone for now   Thurmon Fair, MD, [facility] [phone] office [phone] pager 09/24/2013 8:49 AM

## 2013-09-24 NOTE — CV Procedure (Signed)
    Cardiac Catheterization Procedure Note  Name: [name] MRN: [id number] DOB: 1961/01/01  Procedure: Left Heart Cath, Selective Coronary Angiography, LV angiography  Indication: 53 yo WM with history of Marfan's syndrome presents with sustained VT.   Procedural Details: The right wrist was prepped, draped, and anesthetized with 1% lidocaine. Using the modified Seldinger technique, a 5 French sheath was introduced into the right radial artery. 3 mg of verapamil was administered through the sheath, weight-based unfractionated heparin was administered intravenously. Standard Judkins catheters were used for selective coronary angiography and left ventriculography. Catheter exchanges were performed over an exchange length guidewire. There were no immediate procedural complications. A TR band was used for radial hemostasis at the completion of the procedure.  The patient was transferred to the post catheterization recovery area for further monitoring.  Procedural Findings: Hemodynamics: AO 111/59 mean 82 mm Hg LV 116/13 mm Hg  Coronary angiography: Coronary dominance: left  Left mainstem: Normal.  Left anterior descending (LAD): Normal  Ramus intermediate: Normal.  Left circumflex (LCx): large dominant. Normal.  Right coronary artery (RCA): Normal.  Left ventriculography: Left ventricular systolic function is normal, LVEF is estimated at 55-65%, unable to assess degree of mitral regurgitation   Final Conclusions:   1. Normal coronary anatomy. 2. Normal LV function.  Recommendations: EP evaluation.  Theron Aristaeter [facility] MonroeJordanMD,FACC 09/24/2013, 4:19 PM

## 2013-09-24 NOTE — Progress Notes (Signed)
ANTICOAGULATION CONSULT NOTE  Pharmacy Consult for heparin Indication: chest pain/ACS  Allergies  Allergen Reactions  . Tetracycline Hcl Swelling    Lips, facial swelling    Patient Measurements: Height: 6\' 3"  (190.5 cm) Weight: 238 lb 8.6 oz (108.2 kg) IBW/kg (Calculated) : 84.5 Heparin Dosing Weight: 100 kg  Vital Signs: Temp: 97.9 F (36.6 C) (03/23 2341) Temp src: Oral (03/23 2341) BP: 123/47 mmHg (03/23 2300) Pulse Rate: 74 (03/23 1430)  Labs:  Recent Labs  09/23/13 1020 09/23/13 1025 09/23/13 1707 09/23/13 2140 09/23/13 2323  HGB 13.6 15.6  --   --   --   HCT 42.3 46.0  --   --   --   PLT 254  --   --   --   --   APTT 32  --   --   --   --   LABPROT 13.5  --   --   --   --   INR 1.05  --   --   --   --   HEPARINUNFRC  --   --   --   --  <0.10*  CREATININE 1.12 1.30  --   --   --   TROPONINI  --   --  <0.30 <0.30  --     Estimated Creatinine Clearance: 87.4 ml/min (by C-G formula based on Cr of 1.3).  Assessment: 53 yo male with chest pain for heparin   Goal of Therapy:  Heparin level 0.3-0.7 units/ml Monitor platelets by anticoagulation protocol: Yes   Plan:  Heparin 2000 units IV bolus, then increase heparin 1750 units/hr Follow-up am labs.  [name], [name] 09/24/2013,12:22 AM

## 2013-09-24 NOTE — Consult Note (Signed)
ELECTROPHYSIOLOGY CONSULT NOTE    Patient ID: [name] MRN: [id number], DOB/AGE: [birth year] 53 y.o.  Admit date: 09/23/2013 Date of Consult: 09-24-2013  Primary Physician: Johny Sax, MD Primary Cardiologist: Allyson Sabal  Reason for Consultation: VT  HPI:  [name] is a 53 y.o. male with a past medical history of chronic hepatitis B, HIV, Marfan syndrome, and depression.  He was previously followed by Dr Elsie Lincoln and has since seen Dr Allyson Sabal.  On the day of admission, he had 3 distinct spells of fatigue, palpitations, and chest pressure.  The third episode prompted evaluation at [facility] ER where he was found to be in WCT at rate of 196 beats per minute.  He was cardioverted and transferred to [facility] for further evaluation.   Echocardiogram 01/2013 demonstrated 55-60%, no RWMA, mild to moderate MR, LA 40. Catheterization today demonstrated normal coronary anatomy and normal LV function.   Aside from these symptoms, he denies chest [name], shortness of breath, palpations, syncope or near syncope.    EP has been asked to evaluate for treatment options.  ROS is negative except as outlined above.   Past Medical History  Diagnosis Date  . Chronic hepatitis B   . HIV (human immunodeficiency virus infection)   . Cholesteatoma   . Depression   . Marfan syndrome     Dr Ninetta Lights follows  . Venous insufficiency (chronic) (peripheral)   . Mitral valve prolapse      Surgical History:  Past Surgical History  Procedure Laterality Date  . Cholesteotoma removal    . Appendectomy    . Repair of perforated colon       Prescriptions prior to admission  Medication Sig Dispense Refill  . ALPRAZolam (XANAX) 0.5 MG tablet Take 0.5 mg by mouth 3 (three) times daily.       Marland Kitchen Avocado Oil OIL Take 15 mLs by mouth at bedtime.      . clonazePAM (KLONOPIN) 1 MG tablet Take 1 mg by mouth at bedtime.      . COCONUT OIL PO Take 15 mLs by mouth at bedtime.      Marland Kitchen Emtricitab-Rilpivir-Tenofovir (COMPLERA)  200-25-300 MG TABS Take 1 tablet by mouth at bedtime.      . furosemide (LASIX) 40 MG tablet Take 20 mg by mouth every morning.      . Morphine Sulfate 40 MG CP24 Take 40 mg by mouth 2 (two) times daily.  60 capsule  0  . nortriptyline (PAMELOR) 75 MG capsule Take 150 mg by mouth at bedtime.      Marland Kitchen omeprazole (PRILOSEC) 40 MG capsule Take 1 capsule (40 mg total) by mouth daily.  30 capsule  11  . OVER THE COUNTER MEDICATION Take 1 capsule by mouth daily. [place] Herbal Multivitamin      . OVER THE COUNTER MEDICATION Take 5 mLs by mouth at bedtime. Hemp Oil      . spironolactone (ALDACTONE) 25 MG tablet Take 25 mg by mouth every morning.      Marland Kitchen tetrahydrozoline 0.05 % ophthalmic solution Place 1 drop into both eyes daily as needed (Dry Eyes).        Inpatient Medications:  . aspirin EC  81 mg Oral Daily  . clonazePAM  1 mg Oral QHS  . emtricitabine-tenofovir  1 tablet Oral Q breakfast  . furosemide  20 mg Oral q morning - 10a  . morphine  30 mg Oral Q12H  . nortriptyline  150 mg Oral QHS  . pantoprazole  40 mg Oral Daily  . rilpivirine  25 mg Oral Q breakfast  . sodium chloride  3 mL Intravenous Q12H    Allergies:  Allergies  Allergen Reactions  . Tetracycline Hcl Swelling    Lips, facial swelling    History   Social History  . Marital Status: Single    Spouse Name: [name]    Number of Children: [name]  . Years of Education: [name]   Occupational History  . Not on file.   Social History Main Topics  . Smoking status: Former Smoker -- 0.75 packs/day for 35 years    Types: Cigarettes  . Smokeless tobacco: Never Used     Comment: congratulated! using e-cigarettes  . Alcohol Use: No  . Drug Use: No  . Sexual Activity: No     Comment: pt. declined condoms   Other Topics Concern  . Not on file   Social History Narrative  . No narrative on file     Family History  Problem Relation Age of Onset  . Cancer Mother   . Stroke Sister   . Hypertension Sister   . Hypertension  Mother      Physical exam Marfanoid appearing 53 yo man,NAD HEENT: Unremarkable,[name], AT Neck:  7 JVD, no thyromegally Back:  No CVA tenderness Lungs:  Clear with no wheezes, rales, or rhonchi HEART:  Regular rate rhythm, 3/6 systolic murmurs, no rubs, no clicks Abd:  soft, positive bowel sounds, no organomegally, no rebound, no guarding Ext:  2 plus pulses, 2+ peripheral edema, no cyanosis, no clubbing Skin:  No rashes no nodules Neuro:  CN II through XII intact, motor grossly intact    Labs:   Lab Results  Component Value Date   WBC 8.6 09/23/2013   HGB 15.6 09/23/2013   HCT 46.0 09/23/2013   MCV 82.6 09/23/2013   PLT 254 09/23/2013    Recent Labs Lab 09/23/13 1020 09/23/13 1025  NA 139 141  K 5.0 5.2  CL 99 102  CO2 24  --   BUN 23 32*  CREATININE 1.12 1.30  CALCIUM 10.3  --   GLUCOSE 114* 111*    Radiology/Studies: Dg Chest Port 1 View 09/23/2013   CLINICAL DATA:  Tachycardia, hypotension.  EXAM: PORTABLE CHEST - 1 VIEW  COMPARISON:  05/28/2013  FINDINGS: Cardiomegaly. I am increasing interstitial prominence throughout the lungs, most pronounced in the lower lobe suggesting interstitial edema. No confluent opacity or visible effusions. No acute bony abnormality.  IMPRESSION: Cardiomegaly with increasing interstitial prominence, likely interstitial edema.   Electronically Signed   By: Charlett Nose M.D.   On: 09/23/2013 10:47   Ct Angio Chest Aortic Dissect W &/or W/o 09/23/2013   CLINICAL DATA:  Chest [name]. Gerome Apley; [name]  EXAM: CT ANGIOGRAPHY CHEST, ABDOMEN AND PELVIS  TECHNIQUE: Multidetector CT imaging through the chest, abdomen and pelvis was performed using the standard protocol during bolus administration of intravenous contrast. Multiplanar reconstructed images and MIPs were obtained and reviewed to evaluate the vascular anatomy.  CONTRAST:  OMNIPAQUE IOHEXOL 350 MG/ML SOLN  COMPARISON:  CT ANGIO CHEST W/CM &/OR WO/CM dated 01/23/2011; CT ABD W/CM  dated 05/09/2005  FINDINGS: CTA CHEST FINDINGS  The thoracic inlet is unremarkable.  There is no evidence of mediastinal nor hilar adenopathy nor masses.  There is no evidence of a thoracic aortic aneurysm nor dissection.  Extensive centrilobular and paraseptal emphysematous changes in the upper lobes. There has been decreased conspicuity of the bibasilar atelectasis and/or  scarring. No new focal regions of consolidation or focal infiltrates. The central airways are patent.  Review of the MIP images confirms the above findings.  CTA ABDOMEN AND PELVIS FINDINGS  There is no evidence of an abdominal aortic aneurysm nor dissection. Atherosclerotic calcifications are appreciated within the abdominal aorta and iliac vessels. The celiac, SMA, IMA, portal vein, and SMV are opacified.  The liver is atrophic with a reticular parenchymal pattern and a diffuse nodular border. The spleen is enlarged, stable. Varices within the splenic hilum.  The adrenals and kidneys are unremarkable.  There is no evidence calcified gallstones. There is no evidence of abdominal or pelvic free fluid, loculated fluid collections, masses, nor adenopathy.  There is no evidence of bowel obstruction, enteritis, colitis, nor diverticulitis. A moderate to large amount of stool is appreciated within the colon. Compare to prior study dated 05/09/2005 there is stable common bile duct dilation.  There has been a component of fatty involution of the pancreas which is otherwise unremarkable.  There is no evidence of abdominal wall nor inguinal hernia.  There is no evidence of aggressive appearing osseous lesions.  Review of the MIP images confirms the above findings.  IMPRESSION: 1. There is no evidence of a thoracic nor abdominal aortic aneurysm or dissection. 2. Cirrhotic changes within the liver and portal venous hypertension 3. Extensive emphysematous changes within the lungs 4. Bibasilar scarring and/or atelectasis within the lungs 5. Moderate amount of  fecal retention 6. Otherwise no evidence of obstructive or inflammatory abnormalities, nor CT etiology for the patient's signs and symptoms.   Electronically Signed   By: Johnathan HolmesHector  [name] M.D.   On: 09/23/2013 12:49   EKG:WCT, rate 196, early transition in V1, LBBB, superior axis EKG - NSR  TELEMETRY: sinus rhythm with occasional PVC's  A/P 1. VT, Left bundle, left, superior axis at 200/min, hemodynamically tolerated 2. Mitral regurgitation 3. Marfan Syndrome 4. HIV positive Rec: the mechanism of his VT is obscure. The patient does not appear to be due to LV dysfunction. Because his treatment is dependent on his LV function, and specifically wether her has any scar, I have recommended he undergo cardiac MRI scanning to look for gadolinium enhancement. Medical therapy for his VT is unclear. With his comorbidities, I would suggest amiodarone initially. Would also add a beta blocker and after load reduction in the setting of MR.  Leonia ReevesGregg Taylor,M.D.

## 2013-09-25 ENCOUNTER — Inpatient Hospital Stay (HOSPITAL_COMMUNITY): Payer: PRIVATE HEALTH INSURANCE

## 2013-09-25 DIAGNOSIS — I472 Ventricular tachycardia: Secondary | ICD-10-CM

## 2013-09-25 DIAGNOSIS — I4729 Other ventricular tachycardia: Secondary | ICD-10-CM

## 2013-09-25 LAB — CBC
HEMATOCRIT: 34.9 % — AB (ref 39.0–52.0)
HEMOGLOBIN: 11.4 g/dL — AB (ref 13.0–17.0)
MCH: 27 pg (ref 26.0–34.0)
MCHC: 32.7 g/dL (ref 30.0–36.0)
MCV: 82.5 fL (ref 78.0–100.0)
Platelets: 149 10*3/uL — ABNORMAL LOW (ref 150–400)
RBC: 4.23 MIL/uL (ref 4.22–5.81)
RDW: 15.3 % (ref 11.5–15.5)
WBC: 4.6 10*3/uL (ref 4.0–10.5)

## 2013-09-25 MED ORDER — METOPROLOL TARTRATE 25 MG PO TABS
25.0000 mg | ORAL_TABLET | Freq: Two times a day (BID) | ORAL | Status: DC
  Administered 2013-09-25 – 2013-09-26 (×3): 25 mg via ORAL
  Filled 2013-09-25 (×4): qty 1

## 2013-09-25 MED ORDER — GADOBENATE DIMEGLUMINE 529 MG/ML IV SOLN
33.0000 mL | Freq: Once | INTRAVENOUS | Status: AC
  Administered 2013-09-25: 33 mL via INTRAVENOUS

## 2013-09-25 NOTE — Progress Notes (Signed)
Patient ID: Johnathan LarsenGary W Sprenkle, male   DOB: 06-08-1961, 53 y.o.   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  CURRENT MEDS . aspirin EC  81 mg Oral Daily  . clonazePAM  1 mg Oral QHS  . emtricitabine-tenofovir  1 tablet Oral Q breakfast  . furosemide  20 mg Oral q morning - 10a  . morphine  30 mg Oral Q12H  . nortriptyline  150 mg Oral QHS  . pantoprazole  40 mg Oral Daily  . rilpivirine  25 mg Oral Q breakfast  . sodium chloride  3 mL Intravenous Q12H    OBJECTIVE  Filed Vitals:   09/25/13 0400 09/25/13 0500 09/25/13 0600 09/25/13 0700  BP: 136/73 101/56 120/68 117/75  Pulse: 73 74 72 73  Temp: 98.4 F (36.9 C)   97.7 F (36.5 C)  TempSrc: Oral   Oral  Resp: 13 15 13 13   Height:      Weight:  230 lb 9.6 oz (104.6 kg)    SpO2: 89% 90% 88% 90%    Intake/Output Summary (Last 24 hours) at 09/25/13 [id number] Last data filed at 09/25/13 0800  Gross per 24 hour  Intake 1703.17 ml  Output   3100 ml  Net -1396.83 ml   Filed Weights   09/23/13 1530 09/24/13 0500 09/25/13 0500  Weight: 238 lb 8.6 oz (108.2 kg) 239 lb 13.8 oz (108.8 kg) 230 lb 9.6 oz (104.6 kg)    PHYSICAL EXAM  General: Pleasant, NAD. Neuro: Alert and oriented X 3. Moves all extremities spontaneously. HEENT:  Normal  Neck: Supple without bruits or JVD. Lungs:  Resp regular and unlabored, CTA. Heart: RRR no s3, s4, with a 3/6 MR murmur present. Abdomen: Soft,  non-tender, non-distended, BS + x 4.  Extremities: No clubbing, cyanosis or edema. DP/PT/Radials 2+ and equal bilaterally.  Accessory Clinical Findings  CBC  Recent Labs  09/24/13 1215 09/25/13 0232  WBC 5.2 4.6  HGB 12.1* 11.4*  HCT 36.6* 34.9*  MCV 83.0 82.5  PLT 180 149*   Basic Metabolic Panel  Recent Labs  09/23/13 1020 09/23/13 1025 09/24/13 0840  NA 139 141 138  K 5.0 5.2 5.5*  CL 99 102 101  CO2 24  --  22  GLUCOSE 114* 111* 81  BUN 23 32* 16  CREATININE 1.12 1.30 0.83  CALCIUM 10.3  --  9.2  MG 2.0  --   --    Liver Function Tests No results found for this basename: AST, ALT, ALKPHOS, BILITOT, PROT, ALBUMIN,  in the last 72 hours No results found for this basename: LIPASE, AMYLASE,  in the last 72 hours Cardiac Enzymes  Recent Labs  09/23/13 1707 09/23/13 2140 09/24/13 0840  TROPONINI <0.30 <0.30 <0.30   BNP No components found with this basename: POCBNP,  D-Dimer No results found for this basename: DDIMER,  in the last 72 hours Hemoglobin  A1C No results found for this basename: HGBA1C,  in the last 72 hours Fasting Lipid Panel  Recent Labs  09/24/13 0800  CHOL 148  HDL 41  LDLCALC 83  TRIG 121  CHOLHDL 3.6   Thyroid Function Tests  Recent Labs  09/23/13 1707  TSH 1.026    TELE NSR with PVC's   Radiology/Studies  Dg Chest Port 1 View  09/23/2013   CLINICAL DATA:  Tachycardia, hypotension.  EXAM: PORTABLE CHEST - 1 VIEW  COMPARISON:  05/28/2013  FINDINGS: Cardiomegaly. I am increasing interstitial prominence throughout the lungs, most pronounced in the lower lobe suggesting interstitial edema. No confluent opacity or visible effusions. No acute bony abnormality.  IMPRESSION: Cardiomegaly with increasing interstitial prominence, likely interstitial edema.   Electronically Signed   By: Charlett Nose M.D.   On: 09/23/2013 10:47   Ct Angio Chest Aortic Dissect W &/or W/o  09/23/2013   CLINICAL DATA:  Chest pain. Gerome Apley;  Marfans Chest pain  EXAM: CT ANGIOGRAPHY CHEST, ABDOMEN AND PELVIS  TECHNIQUE: Multidetector CT imaging through the chest, abdomen and pelvis was performed using the standard protocol during bolus administration of intravenous contrast. Multiplanar reconstructed images and MIPs were obtained and reviewed to evaluate the vascular anatomy.  CONTRAST:  OMNIPAQUE IOHEXOL 350 MG/ML SOLN  COMPARISON:  CT ANGIO CHEST W/CM &/OR WO/CM dated 01/23/2011; CT ABD W/CM dated 05/09/2005  FINDINGS: CTA CHEST FINDINGS  The thoracic inlet is unremarkable.  There is no evidence of mediastinal nor hilar adenopathy nor masses.  There is no evidence of a thoracic aortic aneurysm nor dissection.  Extensive centrilobular and paraseptal emphysematous changes in the upper lobes. There has been decreased conspicuity of the bibasilar atelectasis and/or scarring. No new focal regions of consolidation or focal infiltrates. The central airways are patent.  Review of the MIP images confirms the above findings.  CTA ABDOMEN AND PELVIS FINDINGS  There is no evidence of an abdominal aortic aneurysm nor dissection. Atherosclerotic calcifications are appreciated within the abdominal aorta and iliac vessels. The celiac, SMA, IMA, portal vein, and SMV are opacified.  The liver is atrophic with a reticular parenchymal pattern and a diffuse nodular border. The spleen is enlarged, stable. Varices within the splenic hilum.  The adrenals and kidneys are unremarkable.  There is no evidence calcified gallstones. There is no evidence of abdominal or pelvic free fluid, loculated fluid collections, masses, nor adenopathy.  There is no evidence of bowel obstruction, enteritis, colitis, nor diverticulitis. A moderate to large amount of stool is appreciated within the colon. Compare to prior study dated 05/09/2005 there is stable common bile duct dilation.  There has been a component of fatty involution of the pancreas which is otherwise unremarkable.  There is  no evidence of abdominal wall nor inguinal hernia.  There is no evidence of aggressive appearing osseous lesions.  Review of the MIP images confirms the above findings.  IMPRESSION: 1. There is no evidence of a thoracic nor abdominal aortic aneurysm or dissection. 2. Cirrhotic changes within the liver and portal venous hypertension 3. Extensive emphysematous changes within the lungs 4. Bibasilar scarring and/or atelectasis within the lungs 5. Moderate amount of fecal retention 6. Otherwise no evidence of obstructive or inflammatory abnormalities, nor CT etiology for the patient's signs and symptoms.   Electronically Signed   By: Salome Holmes M.D.   On: 09/23/2013 12:49   Ct Cta Abd/pel W/cm &/or W/o Cm  09/23/2013   CLINICAL DATA:  Chest pain. Gerome Apley;  Marfans Chest pain  EXAM: CT ANGIOGRAPHY CHEST, ABDOMEN AND PELVIS  TECHNIQUE: Multidetector CT imaging through the chest, abdomen and pelvis was performed using the standard protocol during bolus administration of intravenous contrast. Multiplanar reconstructed images and MIPs were obtained and reviewed to evaluate the vascular anatomy.  CONTRAST:  OMNIPAQUE IOHEXOL 350 MG/ML SOLN  COMPARISON:  CT ANGIO CHEST W/CM &/OR WO/CM dated 01/23/2011; CT ABD W/CM dated 05/09/2005  FINDINGS: CTA CHEST FINDINGS  The thoracic inlet is unremarkable.  There is no evidence of mediastinal nor hilar adenopathy nor masses.  There is no evidence of a thoracic aortic aneurysm nor dissection.  Extensive centrilobular and paraseptal emphysematous changes in the upper lobes. There has been decreased conspicuity of the bibasilar atelectasis and/or scarring. No new focal regions of consolidation or focal infiltrates. The central airways are patent.  Review of the MIP images confirms the above findings.  CTA ABDOMEN AND PELVIS FINDINGS  There is no evidence of an abdominal aortic aneurysm nor dissection. Atherosclerotic calcifications are appreciated within the abdominal aorta  and iliac vessels. The celiac, SMA, IMA, portal vein, and SMV are opacified.  The liver is atrophic with a reticular parenchymal pattern and a diffuse nodular border. The spleen is enlarged, stable. Varices within the splenic hilum.  The adrenals and kidneys are unremarkable.  There is no evidence calcified gallstones. There is no evidence of abdominal or pelvic free fluid, loculated fluid collections, masses, nor adenopathy.  There is no evidence of bowel obstruction, enteritis, colitis, nor diverticulitis. A moderate to large amount of stool is appreciated within the colon. Compare to prior study dated 05/09/2005 there is stable common bile duct dilation.  There has been a component of fatty involution of the pancreas which is otherwise unremarkable.  There is no evidence of abdominal wall nor inguinal hernia.  There is no evidence of aggressive appearing osseous lesions.  Review of the MIP images confirms the above findings.  IMPRESSION: 1. There is no evidence of a thoracic nor abdominal aortic aneurysm or dissection. 2. Cirrhotic changes within the liver and portal venous hypertension 3. Extensive emphysematous changes within the lungs 4. Bibasilar scarring and/or atelectasis within the lungs 5. Moderate amount of fecal retention 6. Otherwise no evidence of obstructive or inflammatory abnormalities, nor CT etiology for the patient's signs and symptoms.   Electronically Signed   By: Salome Holmes M.D.   On: 09/23/2013 12:49    ASSESSMENT AND PLAN 1. Sustained MMVT - the etiology of his VT is unclear. The morphology and axis suggest that it is coming from the septum in the bottom of his heart near the crux. (?Crux VT). Await cardiac MRI to see if he has any scar, not seen by echo. If none, will plan to discharge home on a beta blocker. He would be a candidate for VT ablation but would allow him to experience more VT on medical therapy as VT ablation would likely be epicardia, either via the coronary sinus or  via the pericardial approach. 2. MR - this will need to be followed. He will be maintained an diuretic therapy.  Cree Napoli,M.D.  09/25/2013 9:22 AM

## 2013-09-26 LAB — CBC
HCT: 39.8 % (ref 39.0–52.0)
Hemoglobin: 13.3 g/dL (ref 13.0–17.0)
MCH: 27.5 pg (ref 26.0–34.0)
MCHC: 33.4 g/dL (ref 30.0–36.0)
MCV: 82.4 fL (ref 78.0–100.0)
Platelets: 174 10*3/uL (ref 150–400)
RBC: 4.83 MIL/uL (ref 4.22–5.81)
RDW: 15.3 % (ref 11.5–15.5)
WBC: 6.2 10*3/uL (ref 4.0–10.5)

## 2013-09-26 MED ORDER — METOPROLOL TARTRATE 25 MG PO TABS: 25.0000 mg | ORAL_TABLET | Freq: Two times a day (BID) | ORAL | Status: DC

## 2013-09-26 NOTE — Progress Notes (Signed)
Patient ID: [name], male   DOB: 05-17-1961, 53 y.o.   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 aspirin EC  81 mg Oral Daily  . clonazePAM  1 mg Oral QHS  . emtricitabine-tenofovir  1 tablet Oral Q breakfast  . furosemide  20 mg Oral q morning - 10a  . metoprolol tartrate  25 mg Oral BID  . morphine  30 mg Oral Q12H  . nortriptyline  150 mg Oral QHS  . pantoprazole  40 mg Oral Daily  . rilpivirine  25 mg Oral Q breakfast  . sodium chloride  3 mL Intravenous Q12H    OBJECTIVE  Filed Vitals:   09/26/13 0400 09/26/13 0500 09/26/13 0600 09/26/13 0700  BP: 104/72  106/70   Pulse: 64 66 61 59  Temp:      TempSrc:      Resp: 14 15 17 15   Height:      Weight:      SpO2: 94% 91% 93% 90%    Intake/Output Summary (Last 24 hours) at 09/26/13 0747 Last data filed at 09/25/13 2200  Gross per 24 hour  Intake   1070 ml  Output   2150 ml  Net  -1080 ml   Filed Weights   09/24/13 0500 09/25/13 0500 09/26/13 0322  Weight: 239 lb 13.8 oz (108.8 kg) 230 lb 9.6 oz (104.6 kg) 221 lb 14.4 oz (100.653 kg)    PHYSICAL EXAM  General: Pleasant, NAD. Neuro: Alert and oriented X 3. Moves all extremities spontaneously. HEENT:  Normal  Neck: Supple without bruits or JVD. Lungs:  Resp regular and unlabored, CTA. Heart: RRR no s3, s4, with a 3/6 MR murmur present. Abdomen: Soft, non-tender, non-distended, BS +  x 4.  Extremities: No clubbing, cyanosis or edema. DP/PT/Radials 2+ and equal bilaterally.  Accessory Clinical Findings  CBC  Recent Labs  09/25/13 0232 09/26/13 0343  WBC 4.6 6.2  HGB 11.4* 13.3  HCT 34.9* 39.8  MCV 82.5 82.4  PLT 149* 174   Basic Metabolic Panel  Recent Labs  09/23/13 1020 09/23/13 1025 09/24/13 0840  NA 139 141 138  K 5.0 5.2 5.5*  CL 99 102 101  CO2 24  --  22  GLUCOSE 114* 111* 81  BUN 23 32* 16  CREATININE 1.12 1.30 0.83  CALCIUM 10.3  --  9.2  MG 2.0  --   --    Liver Function Tests No results found for this basename: AST, ALT, ALKPHOS, BILITOT, PROT, ALBUMIN,  in the last 72 hours No results found for this basename: LIPASE, AMYLASE,  in the last 72 hours Cardiac Enzymes  Recent Labs  09/23/13 1707 09/23/13 2140 09/24/13 0840  TROPONINI <0.30 <0.30 <0.30   BNP No components found with this basename: POCBNP,  D-Dimer No results found for this basename: DDIMER,  in the  last 72 hours Hemoglobin A1C No results found for this basename: HGBA1C,  in the last 72 hours Fasting Lipid Panel  Recent Labs  09/24/13 0800  CHOL 148  HDL 41  LDLCALC 83  TRIG 121  CHOLHDL 3.6   Thyroid Function Tests  Recent Labs  09/23/13 1707  TSH 1.026    TELE NSR with PVC's   Radiology/Studies  Dg Chest Port 1 View  09/23/2013   CLINICAL DATA:  Tachycardia, hypotension.  EXAM: PORTABLE CHEST - 1 VIEW  COMPARISON:  05/28/2013  FINDINGS: Cardiomegaly. I am increasing interstitial prominence throughout the lungs, most pronounced in the lower lobe suggesting interstitial edema. No confluent opacity or visible effusions. No acute bony abnormality.  IMPRESSION: Cardiomegaly with increasing interstitial prominence, likely interstitial edema.   Electronically Signed   By: Charlett Nose M.D.   On: 09/23/2013 10:47   Ct Angio Chest Aortic Dissect W &/or W/o  09/23/2013   CLINICAL DATA:  Chest pain. Gerome Apley; Marfans Chest pain  EXAM: CT  ANGIOGRAPHY CHEST, ABDOMEN AND PELVIS  TECHNIQUE: Multidetector CT imaging through the chest, abdomen and pelvis was performed using the standard protocol during bolus administration of intravenous contrast. Multiplanar reconstructed images and MIPs were obtained and reviewed to evaluate the vascular anatomy.  CONTRAST:  OMNIPAQUE IOHEXOL 350 MG/ML SOLN  COMPARISON:  CT ANGIO CHEST W/CM &/OR WO/CM dated 01/23/2011; CT ABD W/CM dated 05/09/2005  FINDINGS: CTA CHEST FINDINGS  The thoracic inlet is unremarkable.  There is no evidence of mediastinal nor hilar adenopathy nor masses.  There is no evidence of a thoracic aortic aneurysm nor dissection.  Extensive centrilobular and paraseptal emphysematous changes in the upper lobes. There has been decreased conspicuity of the bibasilar atelectasis and/or scarring. No new focal regions of consolidation or focal infiltrates. The central airways are patent.  Review of the MIP images confirms the above findings.  CTA ABDOMEN AND PELVIS FINDINGS  There is no evidence of an abdominal aortic aneurysm nor dissection. Atherosclerotic calcifications are appreciated within the abdominal aorta and iliac vessels. The celiac, SMA, IMA, portal vein, and SMV are opacified.  The liver is atrophic with a reticular parenchymal pattern and a diffuse nodular border. The spleen is enlarged, stable. Varices within the splenic hilum.  The adrenals and kidneys are unremarkable.  There is no evidence calcified gallstones. There is no evidence of abdominal or pelvic free fluid, loculated fluid collections, masses, nor adenopathy.  There is no evidence of bowel obstruction, enteritis, colitis, nor diverticulitis. A moderate to large amount of stool is appreciated within the colon. Compare to prior study dated 05/09/2005 there is stable common bile duct dilation.  There has been a component of fatty involution of the pancreas which is otherwise unremarkable.  There is no evidence of abdominal wall  nor inguinal hernia.  There is no evidence of aggressive appearing osseous lesions.  Review of the MIP images confirms the above findings.  IMPRESSION: 1. There is no evidence of a thoracic nor abdominal aortic aneurysm or dissection. 2. Cirrhotic changes within the liver and portal venous hypertension 3. Extensive emphysematous changes within the lungs 4. Bibasilar scarring and/or atelectasis within the lungs 5. Moderate amount of fecal retention 6. Otherwise no evidence of obstructive or inflammatory abnormalities, nor CT etiology for the patient's signs and symptoms.   Electronically Signed   By: Salome Holmes M.D.   On: 09/23/2013 12:49   Ct Cta Abd/pel W/cm &/or W/o Cm  09/23/2013   CLINICAL DATA:  Chest pain. Gerome ApleyVtach, marfans; Marfans Chest pain  EXAM: CT ANGIOGRAPHY CHEST, ABDOMEN AND PELVIS  TECHNIQUE: Multidetector CT imaging through the chest, abdomen and pelvis was performed using the standard protocol during bolus administration of intravenous contrast. Multiplanar reconstructed images and MIPs were obtained and reviewed to evaluate the vascular anatomy.  CONTRAST:  100mL OMNIPAQUE IOHEXOL 350 MG/ML SOLN  COMPARISON:  CT ANGIO CHEST W/CM &/OR WO/CM dated 01/23/2011; CT ABD W/CM dated 05/09/2005  FINDINGS: CTA CHEST FINDINGS  The thoracic inlet is unremarkable.  There is no evidence of mediastinal nor hilar adenopathy nor masses.  There is no evidence of a thoracic aortic aneurysm nor dissection.  Extensive centrilobular and paraseptal emphysematous changes in the upper lobes. There has been decreased conspicuity of the bibasilar atelectasis and/or scarring. No new focal regions of consolidation or focal infiltrates. The central airways are patent.  Review of the MIP images confirms the above findings.  CTA ABDOMEN AND PELVIS FINDINGS  There is no evidence of an abdominal aortic aneurysm nor dissection. Atherosclerotic calcifications are appreciated within the abdominal aorta and iliac vessels. The  celiac, SMA, IMA, portal vein, and SMV are opacified.  The liver is atrophic with a reticular parenchymal pattern and a diffuse nodular border. The spleen is enlarged, stable. Varices within the splenic hilum.  The adrenals and kidneys are unremarkable.  There is no evidence calcified gallstones. There is no evidence of abdominal or pelvic free fluid, loculated fluid collections, masses, nor adenopathy.  There is no evidence of bowel obstruction, enteritis, colitis, nor diverticulitis. A moderate to large amount of stool is appreciated within the colon. Compare to prior study dated 05/09/2005 there is stable common bile duct dilation.  There has been a component of fatty involution of the pancreas which is otherwise unremarkable.  There is no evidence of abdominal wall nor inguinal hernia.  There is no evidence of aggressive appearing osseous lesions.  Review of the MIP images confirms the above findings.  IMPRESSION: 1. There is no evidence of a thoracic nor abdominal aortic aneurysm or dissection. 2. Cirrhotic changes within the liver and portal venous hypertension 3. Extensive emphysematous changes within the lungs 4. Bibasilar scarring and/or atelectasis within the lungs 5. Moderate amount of fecal retention 6. Otherwise no evidence of obstructive or inflammatory abnormalities, nor CT etiology for the patient's signs and symptoms.   Electronically Signed   By: Salome HolmesHector  Cooper M.D.   On: 09/23/2013 12:49    ASSESSMENT AND PLAN 1. Sustained MMVT - the etiology of his VT is unclear. The morphology and axis suggest that it is coming from the septum in the bottom of his heart near the crux. (?Crux VT). Cardiac MRI performed yesterday demonstrates LV basilar thinning and severe hypokinesis of this region, not seen by echo. His EF 54%. Will discharge home on a beta blocker to start. He would be a candidate for VT ablation but would allow him to experience more VT on medical therapy as VT ablation would likely be  epicardial, either via the coronary sinus or via the pericardial approach. 2. MR - this will need to be followed. He will be maintained an diuretic therapy.  Gregg Taylor,M.D.  09/26/2013 7:47 AM

## 2013-09-26 NOTE — Discharge Summary (Signed)
ELECTROPHYSIOLOGY DISCHARGE SUMMARY   Patient ID: [name],  MRN: [id number], DOB/AGE: 02/29/1961 53 y.o.  Admit date: 09/23/2013 Discharge date: 09/26/2013  Primary Care Physician: Johny Sax, MD Primary Cardiologist: Lewayne Bunting, MD  Primary Discharge Diagnosis:  1. Sustained monomorphic VT  Secondary Discharge Diagnoses:  1. HIV 2. Chronic hepatitis B 3. Marfan syndrome  Procedures This Admission:   1. Cardiac catheterization 09/24/2013 Procedural Findings:  Hemodynamics:  AO 111/59 mean 82 mm Hg  LV 116/13 mm Hg  Coronary angiography:  Coronary dominance: left  Left mainstem: Normal.  Left anterior descending (LAD): Normal  Ramus intermediate: Normal.  Left circumflex (LCx): large dominant. Normal.  Right coronary artery (RCA): Normal.  Left ventriculography: Left ventricular systolic function is normal, LVEF is estimated at 55-65%, unable to assess degree of mitral regurgitation  Final Conclusions:  Normal coronary anatomy.  Normal LV function.   2. Cardiac MRI 09/25/2013 FINDINGS: Limited images of the lung fields showed no gross abnormalities. There was prominent epicardial adipose tissue. Normal left ventricular size and wall thickness. EF 54%. There was basal inferolateral thinning and severe hypokinesis to akinesis. Normal right ventricular size and systolic function with no regional wall motion abnormalities or aneurysmal segments. There was no evidence for fatty infiltration of the RV by T1 imaging. There was no aortic stenosis or significant regurgitation. There was mild to moderate anteriorly directed mitral regurgitation with probable mild mitral valve prolapse. On delayed enhancement imaging, there may have been enhancement in the basal inferolateral wall but interpretation was difficult given the thinning of the wall. MEASUREMENTS: LV EDV 268 mL LV SV 146 mL LV EF 54% IMPRESSION: 1. Normal LV size with EF 54%. The basal inferolateral  wall was thinned and severely hypokinetic to akinetic. 2. Normal RV size and systolic function, no evidence for AVRC. 3. There may have been delayed enhancement in the basal inferolateral wall but difficult to interpret due to thinning of this wall segment.  History and Hospital Course:  [name] is a 53 year old man with HIV, chronic hepatitis B, Marfan syndrome and depression. He was previously followed by Dr Elsie Lincoln and has since seen Dr Allyson Sabal. On the day of admission, he had 3 distinct spells of fatigue, palpitations and chest pressure. The third episode prompted evaluation at [facility] ED where he was found to be in [facility] at rate of 196 beats per minute. He was cardioverted and transferred to [facility] for further evaluation on 09/23/2013. Echocardiogram back in July 2014 demonstrated 55-60%, no RWMA, mild to moderate MR, LA 40. This was not repeated this admission. Cardiac catheterization 09/24/2013 demonstrated normal coronary anatomy and normal LV function. Aside from the above-mentioned symptoms, he had no other complaints and denied chest pain, shortness of breath, palpations or syncope. The etiology of his VT was unclear; therefore, Dr Ladona Ridgel recommended he undergo cardiac MRI. This was done 09/25/2013 and revealed LV basilar thinning and severe hypokinesis of this region, not seen by echo. His EF 54%. He was started on metoprolol. Dr Ladona Ridgel feels he would be a candidate for VT ablation but would allow him to continue medical therapy for now as VT ablation would likely be epicardial, either via the coronary sinus or via the pericardial approach. He has been seen, examined and deemed stable for discharge home today by Dr. Lewayne Bunting. He will follow-up in the office in 3-4 weeks.   Discharge Vitals: Blood pressure 106/70, pulse 59, temperature 98.1 F (36.7 C), temperature source Oral, resp. rate 15,  height 6\' 3"  (1.905 m), weight 221 lb 14.4 oz (100.653 kg), SpO2 90.00%.   Labs: Lab Results  Component  Value Date   WBC 6.2 09/26/2013   HGB 13.3 09/26/2013   HCT 39.8 09/26/2013   MCV 82.4 09/26/2013   PLT 174 09/26/2013    Recent Labs Lab 09/24/13 0840  NA 138  K 5.5*  CL 101  CO2 22  BUN 16  CREATININE 0.83  CALCIUM 9.2  GLUCOSE 81   Lab Results  Component Value Date   CKTOTAL 23 01/23/2011   CKMB 1.1 01/23/2011   TROPONINI <0.30 09/24/2013    Lab Results  Component Value Date   CHOL 148 09/24/2013   CHOL 130 09/09/2013   CHOL 146 11/14/2012   Lab Results  Component Value Date   HDL 41 09/24/2013   HDL 37* 09/09/2013   HDL 39* 11/14/2012   Lab Results  Component Value Date   LDLCALC 83 09/24/2013   LDLCALC 71 09/09/2013   LDLCALC 90 11/14/2012   Lab Results  Component Value Date   TRIG 121 09/24/2013   TRIG 111 09/09/2013   TRIG 85 11/14/2012   Lab Results  Component Value Date   CHOLHDL 3.6 09/24/2013   CHOLHDL 3.5 09/09/2013   CHOLHDL 3.7 11/14/2012    Recent Labs  09/23/13 1020  INR 1.05    Disposition:  The patient is being discharged in stable condition.  Follow-up: Follow-up Information   Follow up with Lewayne Bunting, MD On 10/17/2013. (At 2:00 PM)    Specialty:  Cardiology   Contact information:   [address] [place] [address] [phone]      Discharge Medications:    Medication List         ALPRAZolam 0.5 MG tablet  Commonly known as:  XANAX  Take 0.5 mg by mouth 3 (three) times daily.     Avocado Oil Oil  Take 15 mLs by mouth at bedtime.     clonazePAM 1 MG tablet  Commonly known as:  KLONOPIN  Take 1 mg by mouth at bedtime.     COCONUT OIL PO  Take 15 mLs by mouth at bedtime.     COMPLERA 200-25-300 MG Tabs  Generic drug:  Emtricitab-Rilpivir-Tenofovir  Take 1 tablet by mouth at bedtime.     furosemide 40 MG tablet  Commonly known as:  LASIX  Take 20 mg by mouth every morning.     metoprolol tartrate 25 MG tablet  Commonly known as:  LOPRESSOR  Take 1 tablet (25 mg total) by mouth 2 (two) times daily.       Morphine Sulfate 40 MG Cp24  Take 40 mg by mouth 2 (two) times daily.     nortriptyline 75 MG capsule  Commonly known as:  PAMELOR  Take 150 mg by mouth at bedtime.     omeprazole 40 MG capsule  Commonly known as:  PRILOSEC  Take 1 capsule (40 mg total) by mouth daily.     OVER THE COUNTER MEDICATION  Take 1 capsule by mouth daily. [place] Herbal Multivitamin     OVER THE COUNTER MEDICATION  Take 5 mLs by mouth at bedtime. Hemp Oil     spironolactone 25 MG tablet  Commonly known as:  ALDACTONE  Take 25 mg by mouth every morning.     tetrahydrozoline 0.05 % ophthalmic solution  Place 1 drop into both eyes daily as needed (Dry Eyes).       Duration of  Discharge Encounter: Greater than 30 minutes including physician time.  Signed, Rick DuffDMISTEN, Oseph Imburgia, PA-C 09/26/2013, 8:29 AM

## 2013-09-27 ENCOUNTER — Telehealth: Payer: Self-pay | Admitting: *Deleted

## 2013-09-27 NOTE — Telephone Encounter (Signed)
Patient called to get his most recent labs. And gave him CD4 100 and viral load of undetectable.

## 2013-10-08 ENCOUNTER — Other Ambulatory Visit: Payer: Self-pay | Admitting: *Deleted

## 2013-10-08 DIAGNOSIS — G629 Polyneuropathy, unspecified: Secondary | ICD-10-CM

## 2013-10-08 MED ORDER — MORPHINE SULFATE ER 40 MG PO CP24: 40.0000 mg | ORAL_CAPSULE | Freq: Two times a day (BID) | ORAL | Status: DC

## 2013-10-09 ENCOUNTER — Other Ambulatory Visit: Payer: Self-pay | Admitting: Infectious Diseases

## 2013-10-17 ENCOUNTER — Ambulatory Visit (INDEPENDENT_AMBULATORY_CARE_PROVIDER_SITE_OTHER): Payer: PRIVATE HEALTH INSURANCE | Admitting: Internal Medicine

## 2013-10-17 ENCOUNTER — Encounter: Payer: Self-pay | Admitting: Internal Medicine

## 2013-10-17 VITALS — BP 119/76 | HR 86 | Ht 75.0 in | Wt 236.0 lb

## 2013-10-17 DIAGNOSIS — I472 Ventricular tachycardia, unspecified: Secondary | ICD-10-CM

## 2013-10-17 DIAGNOSIS — R600 Localized edema: Secondary | ICD-10-CM

## 2013-10-17 DIAGNOSIS — R609 Edema, unspecified: Secondary | ICD-10-CM

## 2013-10-17 DIAGNOSIS — I4729 Other ventricular tachycardia: Secondary | ICD-10-CM

## 2013-10-17 NOTE — Progress Notes (Signed)
HPI [name] returns today for followup. He is a pleasant 53 yo man with Marfan's syndrome, who presented with sustained VT. He underwent evaluation and was found on MRI that he had basilar thining with preserved LV function. He was placed on a beta blocker. He did not have syncope. He has done well in the interim. He admits to dietary indiscretion.  Allergies  Allergen Reactions  . Tetracycline Hcl Swelling    Lips, facial swelling     Current Outpatient Prescriptions  Medication Sig Dispense Refill  . ALPRAZolam (XANAX) 0.5 MG tablet Take 0.5 mg by mouth 3 (three) times daily.       Marland Kitchen. Avocado Oil OIL Take 15 mLs by mouth at bedtime.      . clonazePAM (KLONOPIN) 1 MG tablet Take 1 mg by mouth at bedtime.      . COCONUT OIL PO Take 15 mLs by mouth at bedtime.      Marland Kitchen. Emtricitab-Rilpivir-Tenofovir (COMPLERA) 200-25-300 MG TABS Take 1 tablet by mouth at bedtime.      . furosemide (LASIX) 40 MG tablet Take 20 mg by mouth every morning.      . metoprolol tartrate (LOPRESSOR) 25 MG tablet Take 1 tablet (25 mg total) by mouth 2 (two) times daily.  60 tablet  3  . Morphine Sulfate 40 MG CP24 Take 40 mg by mouth 2 (two) times daily.  60 capsule  0  . nortriptyline (PAMELOR) 75 MG capsule Take 150 mg by mouth at bedtime.      Marland Kitchen. omeprazole (PRILOSEC) 40 MG capsule TAKE 1 CAPSULE BY MOUTH DAILY.  30 capsule  11  . OVER THE COUNTER MEDICATION Take 1 capsule by mouth daily. [place] Herbal Multivitamin      . OVER THE COUNTER MEDICATION Take 5 mLs by mouth at bedtime. Hemp Oil      . spironolactone (ALDACTONE) 25 MG tablet Take 25 mg by mouth every morning.      Marland Kitchen. tetrahydrozoline 0.05 % ophthalmic solution Place 1 drop into both eyes daily as needed (Dry Eyes).       No current facility-administered medications for this visit.     Past Medical History  Diagnosis Date  . Chronic hepatitis B   . HIV (human immunodeficiency virus infection)   . Cholesteatoma   . Depression   . Marfan  syndrome     Dr Ninetta LightsHatcher follows  . Venous insufficiency (chronic) (peripheral)   . Mitral valve prolapse   . VT (ventricular tachycardia)   . COPD (chronic obstructive pulmonary disease)     ROS:   All systems reviewed and negative except as noted in the HPI.   Past Surgical History  Procedure Laterality Date  . Cholesteotoma removal    . Appendectomy    . Repair of perforated colon       Family History  Problem Relation Age of Onset  . Cancer Mother   . Stroke Sister   . Hypertension Sister   . Hypertension Mother      History   Social History  . Marital Status: Single    Spouse Name: [name]    Number of Children: [name]  . Years of Education: [name]   Occupational History  . Not on file.   Social History Main Topics  . Smoking status: Former Smoker -- 0.75 packs/day for 35 years    Types: Cigarettes  . Smokeless tobacco: Never Used     Comment: congratulated! using e-cigarettes  .  Alcohol Use: No  . Drug Use: No  . Sexual Activity: No     Comment: pt. declined condoms   Other Topics Concern  . Not on file   Social History Narrative  . No narrative on file     BP 119/76  Pulse 86  Ht 6\' 3"  (1.905 m)  Wt 236 lb (107.049 kg)  BMI 29.50 kg/m2  Physical Exam:  Well appearing middle aged man, NAD HEENT: Unremarkable except for characteristic Marfan's facies Neck:  No JVD, no thyromegally Back:  No CVA tenderness Lungs:  Clear with no wheezes HEART:  Regular rate rhythm, no murmurs, no rubs, no clicks Abd:  soft, positive obese, bowel sounds, no organomegally, no rebound, no guarding Ext:  2 plus pulses, no edema, no cyanosis, no clubbing Skin:  No rashes no nodules Neuro:  CN II through XII intact, motor grossly intact  EKG - nsr with LVH  Assess/Plan:

## 2013-10-17 NOTE — Assessment & Plan Note (Signed)
He has venous insufficiency. We discussed the importance of a low sodium diet, elevation, and judicious use of lasix. Also, I have asked the patient to keep his legs elevated.

## 2013-10-17 NOTE — Patient Instructions (Signed)
Your physician wants you to follow-up in: 6 months with Dr Taylor You will receive a reminder letter in the mail two months in advance. If you don't receive a letter, please call our office to schedule the follow-up appointment.  

## 2013-10-17 NOTE — Assessment & Plan Note (Signed)
His arrhythmias are currently well controlled. No change in medical therapy.

## 2013-11-05 ENCOUNTER — Other Ambulatory Visit: Payer: Self-pay | Admitting: Licensed Clinical Social Worker

## 2013-11-05 DIAGNOSIS — G629 Polyneuropathy, unspecified: Secondary | ICD-10-CM

## 2013-11-05 MED ORDER — MORPHINE SULFATE ER 40 MG PO CP24: 40.0000 mg | ORAL_CAPSULE | Freq: Two times a day (BID) | ORAL | Status: DC

## 2013-12-04 ENCOUNTER — Other Ambulatory Visit: Payer: Self-pay | Admitting: *Deleted

## 2013-12-04 DIAGNOSIS — G629 Polyneuropathy, unspecified: Secondary | ICD-10-CM

## 2013-12-04 MED ORDER — MORPHINE SULFATE ER 40 MG PO CP24: 40.0000 mg | ORAL_CAPSULE | Freq: Two times a day (BID) | ORAL | Status: DC

## 2013-12-05 ENCOUNTER — Other Ambulatory Visit: Payer: Self-pay | Admitting: Infectious Diseases

## 2014-01-01 ENCOUNTER — Other Ambulatory Visit: Payer: Self-pay | Admitting: Licensed Clinical Social Worker

## 2014-01-01 DIAGNOSIS — G629 Polyneuropathy, unspecified: Secondary | ICD-10-CM

## 2014-01-01 MED ORDER — MORPHINE SULFATE ER 40 MG PO CP24: 40.0000 mg | ORAL_CAPSULE | Freq: Two times a day (BID) | ORAL | Status: DC

## 2014-01-29 ENCOUNTER — Other Ambulatory Visit: Payer: Self-pay | Admitting: *Deleted

## 2014-01-29 DIAGNOSIS — G629 Polyneuropathy, unspecified: Secondary | ICD-10-CM

## 2014-01-29 MED ORDER — MORPHINE SULFATE ER 40 MG PO CP24: 40.0000 mg | ORAL_CAPSULE | Freq: Two times a day (BID) | ORAL | Status: DC

## 2014-01-29 NOTE — Telephone Encounter (Signed)
Requested that Dr. Daiva EvesVan Dam sign for Dr. Ninetta LightsHatcher.

## 2014-02-25 ENCOUNTER — Other Ambulatory Visit: Payer: Self-pay | Admitting: Licensed Clinical Social Worker

## 2014-02-25 MED ORDER — METOPROLOL TARTRATE 25 MG PO TABS: 25.0000 mg | ORAL_TABLET | Freq: Two times a day (BID) | ORAL | Status: DC

## 2014-02-26 ENCOUNTER — Other Ambulatory Visit: Payer: Self-pay | Admitting: Licensed Clinical Social Worker

## 2014-02-26 ENCOUNTER — Other Ambulatory Visit: Payer: PRIVATE HEALTH INSURANCE

## 2014-02-26 DIAGNOSIS — G629 Polyneuropathy, unspecified: Secondary | ICD-10-CM

## 2014-02-26 MED ORDER — MORPHINE SULFATE ER 40 MG PO CP24: 40.0000 mg | ORAL_CAPSULE | Freq: Two times a day (BID) | ORAL | Status: DC

## 2014-02-27 ENCOUNTER — Other Ambulatory Visit: Payer: Self-pay | Admitting: Infectious Diseases

## 2014-03-03 ENCOUNTER — Other Ambulatory Visit: Payer: PRIVATE HEALTH INSURANCE

## 2014-03-03 DIAGNOSIS — B20 Human immunodeficiency virus [HIV] disease: Secondary | ICD-10-CM

## 2014-03-03 LAB — CBC WITH DIFFERENTIAL/PLATELET
BASOS ABS: 0 10*3/uL (ref 0.0–0.1)
BASOS PCT: 0 % (ref 0–1)
Eosinophils Absolute: 0.1 10*3/uL (ref 0.0–0.7)
Eosinophils Relative: 3 % (ref 0–5)
HCT: 35.8 % — ABNORMAL LOW (ref 39.0–52.0)
HEMOGLOBIN: 12.3 g/dL — AB (ref 13.0–17.0)
Lymphocytes Relative: 25 % (ref 12–46)
Lymphs Abs: 1.1 10*3/uL (ref 0.7–4.0)
MCH: 26.7 pg (ref 26.0–34.0)
MCHC: 34.4 g/dL (ref 30.0–36.0)
MCV: 77.8 fL — ABNORMAL LOW (ref 78.0–100.0)
MONOS PCT: 7 % (ref 3–12)
Monocytes Absolute: 0.3 10*3/uL (ref 0.1–1.0)
NEUTROS PCT: 65 % (ref 43–77)
Neutro Abs: 2.9 10*3/uL (ref 1.7–7.7)
PLATELETS: 203 10*3/uL (ref 150–400)
RBC: 4.6 MIL/uL (ref 4.22–5.81)
RDW: 16 % — AB (ref 11.5–15.5)
WBC: 4.5 10*3/uL (ref 4.0–10.5)

## 2014-03-03 LAB — COMPREHENSIVE METABOLIC PANEL
ALT: 11 U/L (ref 0–53)
AST: 11 U/L (ref 0–37)
Albumin: 4 g/dL (ref 3.5–5.2)
Alkaline Phosphatase: 135 U/L — ABNORMAL HIGH (ref 39–117)
BUN: 16 mg/dL (ref 6–23)
CALCIUM: 9.2 mg/dL (ref 8.4–10.5)
CHLORIDE: 103 meq/L (ref 96–112)
CO2: 25 mEq/L (ref 19–32)
Creat: 1 mg/dL (ref 0.50–1.35)
Glucose, Bld: 97 mg/dL (ref 70–99)
POTASSIUM: 4.5 meq/L (ref 3.5–5.3)
Sodium: 137 mEq/L (ref 135–145)
Total Bilirubin: 0.4 mg/dL (ref 0.2–1.2)
Total Protein: 7.1 g/dL (ref 6.0–8.3)

## 2014-03-04 LAB — T-HELPER CELL (CD4) - (RCID CLINIC ONLY)
CD4 T CELL ABS: 100 /uL — AB (ref 400–2700)
CD4 T CELL HELPER: 9 % — AB (ref 33–55)

## 2014-03-05 LAB — HIV-1 RNA QUANT-NO REFLEX-BLD
HIV 1 RNA QUANT: 23 {copies}/mL — AB (ref <20)
HIV-1 RNA Quant, Log: 1.36 {Log} — ABNORMAL HIGH (ref <1.30)

## 2014-03-12 ENCOUNTER — Ambulatory Visit (INDEPENDENT_AMBULATORY_CARE_PROVIDER_SITE_OTHER): Payer: PRIVATE HEALTH INSURANCE | Admitting: Infectious Diseases

## 2014-03-12 ENCOUNTER — Encounter: Payer: Self-pay | Admitting: Infectious Diseases

## 2014-03-12 VITALS — BP 144/86 | HR 102 | Temp 98.2°F | Wt 235.0 lb

## 2014-03-12 DIAGNOSIS — I472 Ventricular tachycardia, unspecified: Secondary | ICD-10-CM

## 2014-03-12 DIAGNOSIS — B20 Human immunodeficiency virus [HIV] disease: Secondary | ICD-10-CM

## 2014-03-12 DIAGNOSIS — L97529 Non-pressure chronic ulcer of other part of left foot with unspecified severity: Secondary | ICD-10-CM

## 2014-03-12 DIAGNOSIS — E1169 Type 2 diabetes mellitus with other specified complication: Secondary | ICD-10-CM

## 2014-03-12 DIAGNOSIS — I4729 Other ventricular tachycardia: Secondary | ICD-10-CM

## 2014-03-12 DIAGNOSIS — L97509 Non-pressure chronic ulcer of other part of unspecified foot with unspecified severity: Secondary | ICD-10-CM

## 2014-03-12 DIAGNOSIS — F17200 Nicotine dependence, unspecified, uncomplicated: Secondary | ICD-10-CM

## 2014-03-12 DIAGNOSIS — Z23 Encounter for immunization: Secondary | ICD-10-CM

## 2014-03-12 DIAGNOSIS — E11621 Type 2 diabetes mellitus with foot ulcer: Secondary | ICD-10-CM

## 2014-03-12 NOTE — Assessment & Plan Note (Signed)
He is doing well, ok on his current meds. He is not sexually active. He gets flu shot today. Will see him back in 3-4 months.

## 2014-03-12 NOTE — Progress Notes (Signed)
   Subjective:    Patient ID: [name], male    DOB: Mar 11, 1961, 53 y.o.   MRN: [id number]  HPI 53 yo M with neuropathy, HIV/AIDS, DM that is diet controlled, suspected Marfan's syndrome, and COPD. He was started on complera 2012.  TTE 09-22-11- Grade I diastolic dysfunction.  Was in hospital 09-2013 with sustained VT. His cath was normal. He was treated medically.   At previous ID visit, he had quit smoking. He resumed smoking 2 weeks ago and has since had increased cough, feels like he has fluid. His sister had moved temporarily as his she had laryngeal CA (had surgery). She was continuing to smoke.  Denies missed ART.  Wants to get his own house, wants a small yard.   HIV 1 RNA Quant (copies/mL)  Date Value  03/03/2014 23*  09/09/2013 <20   05/28/2013 <20      CD4 T Cell Abs (/uL)  Date Value  03/03/2014 100*  09/09/2013 100*  05/28/2013 100*   Has lost 15# since his sister moved out.  LE edema better.  Has podiatry available 4x/year.   Review of Systems  Constitutional: Negative for appetite change and unexpected weight change.  Respiratory: Positive for cough.   Cardiovascular: Positive for leg swelling.       Has rare, > 10 sec episodes of CP.   Gastrointestinal: Negative for diarrhea and constipation.  Genitourinary: Negative for difficulty urinating.       Objective:   Physical Exam  Constitutional: He appears well-developed and well-nourished.  Eyes: EOM are normal. Pupils are equal, round, and reactive to light.  Neck: Neck supple.  Cardiovascular: Normal rate, regular rhythm and normal heart sounds.   Pulmonary/Chest: Effort normal and breath sounds normal. He exhibits no tenderness.  Abdominal: Soft. Bowel sounds are normal. He exhibits no distension. There is no tenderness.  Musculoskeletal: He exhibits edema.  Callus on sole of R foot, some central cracking.   Lymphadenopathy:    He has no cervical adenopathy.          Assessment & Plan:

## 2014-03-12 NOTE — Assessment & Plan Note (Signed)
Has advance directives, no tube feedings or respirator. Has HCPOA.  Will make f/u appt with Dr Ladona Ridgel in October.

## 2014-03-12 NOTE — Assessment & Plan Note (Signed)
I encouraged him to quit smoking, mutllple times.

## 2014-03-12 NOTE — Assessment & Plan Note (Signed)
Encouraged him to f/u podiatry. Watch diet.

## 2014-03-25 ENCOUNTER — Other Ambulatory Visit: Payer: Self-pay | Admitting: Infectious Diseases

## 2014-03-31 ENCOUNTER — Other Ambulatory Visit: Payer: Self-pay | Admitting: *Deleted

## 2014-03-31 ENCOUNTER — Other Ambulatory Visit: Payer: Self-pay | Admitting: Infectious Diseases

## 2014-03-31 DIAGNOSIS — G629 Polyneuropathy, unspecified: Secondary | ICD-10-CM

## 2014-03-31 MED ORDER — MORPHINE SULFATE ER 40 MG PO CP24: 40.0000 mg | ORAL_CAPSULE | Freq: Two times a day (BID) | ORAL | Status: DC

## 2014-04-21 ENCOUNTER — Telehealth: Payer: Self-pay | Admitting: Licensed Clinical Social Worker

## 2014-04-21 NOTE — Telephone Encounter (Signed)
Patient complaining of cough with dark mucous, and fever for 3 days. Patient advised to come to be seen, appointment made for tomorrow.

## 2014-04-22 ENCOUNTER — Ambulatory Visit: Payer: PRIVATE HEALTH INSURANCE | Admitting: Infectious Diseases

## 2014-04-25 ENCOUNTER — Other Ambulatory Visit: Payer: Self-pay | Admitting: Licensed Clinical Social Worker

## 2014-04-25 DIAGNOSIS — G629 Polyneuropathy, unspecified: Secondary | ICD-10-CM

## 2014-04-25 MED ORDER — MORPHINE SULFATE ER 40 MG PO CP24: 40.0000 mg | ORAL_CAPSULE | Freq: Two times a day (BID) | ORAL | Status: DC

## 2014-04-29 ENCOUNTER — Ambulatory Visit: Payer: PRIVATE HEALTH INSURANCE | Admitting: Internal Medicine

## 2014-04-30 ENCOUNTER — Ambulatory Visit (INDEPENDENT_AMBULATORY_CARE_PROVIDER_SITE_OTHER): Payer: PRIVATE HEALTH INSURANCE | Admitting: Infectious Diseases

## 2014-04-30 ENCOUNTER — Encounter: Payer: Self-pay | Admitting: Infectious Diseases

## 2014-04-30 VITALS — BP 107/70 | HR 74 | Temp 98.2°F | Wt 234.0 lb

## 2014-04-30 DIAGNOSIS — J440 Chronic obstructive pulmonary disease with acute lower respiratory infection: Principal | ICD-10-CM

## 2014-04-30 DIAGNOSIS — B20 Human immunodeficiency virus [HIV] disease: Secondary | ICD-10-CM

## 2014-04-30 DIAGNOSIS — E1140 Type 2 diabetes mellitus with diabetic neuropathy, unspecified: Secondary | ICD-10-CM

## 2014-04-30 DIAGNOSIS — Z720 Tobacco use: Secondary | ICD-10-CM

## 2014-04-30 DIAGNOSIS — E1142 Type 2 diabetes mellitus with diabetic polyneuropathy: Secondary | ICD-10-CM

## 2014-04-30 DIAGNOSIS — F17200 Nicotine dependence, unspecified, uncomplicated: Secondary | ICD-10-CM

## 2014-04-30 DIAGNOSIS — J209 Acute bronchitis, unspecified: Secondary | ICD-10-CM

## 2014-04-30 DIAGNOSIS — J441 Chronic obstructive pulmonary disease with (acute) exacerbation: Secondary | ICD-10-CM

## 2014-04-30 MED ORDER — FLUTICASONE PROPIONATE HFA 110 MCG/ACT IN AERO: 1.0000 | INHALATION_SPRAY | Freq: Every day | RESPIRATORY_TRACT | Status: DC

## 2014-04-30 MED ORDER — ALBUTEROL SULFATE 108 (90 BASE) MCG/ACT IN AEPB: 2.0000 | INHALATION_SPRAY | Freq: Four times a day (QID) | RESPIRATORY_TRACT | Status: DC | PRN

## 2014-04-30 NOTE — Assessment & Plan Note (Signed)
He's doing well. Will continue his complera. Will check his labs at his f/u appt.

## 2014-04-30 NOTE — Assessment & Plan Note (Signed)
Not checking FSG or on rx.

## 2014-04-30 NOTE — Assessment & Plan Note (Signed)
Encouraged him to stay off tobacco.

## 2014-04-30 NOTE — Progress Notes (Signed)
   Subjective:    Patient ID: Johnathan LarsenGary W Student, male    DOB: [birth year], 53 y.o.   MRN: [id number]  Cough Associated symptoms include a fever and shortness of breath. Pertinent negatives include no chills.   53 yo M with neuropathy, HIV/AIDS, DM that is diet controlled, suspected Marfan's syndrome, and COPD. He was started on complera 2012.  Has been coughing for the last 3 weeks, feeling better now. Has been taking mucinex.  Felt achy, had temp to 102 (improved with ibuprofen). No fever in last 2 days. Has been coughing up green (more from sinuses), beige (from chest).  Has been taking ART every night. Has mild nausea if not taken with food.  Has been having trouble with his sister, she is being treated for laryngeal Ca.   HIV 1 RNA Quant (copies/mL)  Date Value  03/03/2014 23*  09/09/2013 <20   05/28/2013 <20      CD4 T Cell Abs (/uL)  Date Value  03/03/2014 100*  09/09/2013 100*  05/28/2013 100*     Review of Systems  Constitutional: Positive for fever. Negative for chills, appetite change and unexpected weight change.  Respiratory: Positive for cough and shortness of breath.        Objective:   Physical Exam  Constitutional: He appears well-developed and well-nourished.  HENT:  Mouth/Throat: No oropharyngeal exudate.  Eyes: EOM are normal. Pupils are equal, round, and reactive to light.  Neck: Neck supple.  Cardiovascular: Normal rate and regular rhythm.   Murmur heard. Pulmonary/Chest: Effort normal and breath sounds normal.  Abdominal: Soft. Bowel sounds are normal. There is no tenderness. There is no rebound.  Musculoskeletal: He exhibits edema.  Lymphadenopathy:    He has no cervical adenopathy.          Assessment & Plan:

## 2014-04-30 NOTE — Assessment & Plan Note (Signed)
He has improved with OTCs. He has not been using inhalers, will refill those. Encouraged him to try OTCs antihistamines. Will see him back prn.

## 2014-05-26 ENCOUNTER — Other Ambulatory Visit: Payer: Self-pay | Admitting: *Deleted

## 2014-05-26 DIAGNOSIS — G629 Polyneuropathy, unspecified: Secondary | ICD-10-CM

## 2014-05-26 MED ORDER — MORPHINE SULFATE ER 40 MG PO CP24: 40.0000 mg | ORAL_CAPSULE | Freq: Two times a day (BID) | ORAL | Status: DC

## 2014-05-26 NOTE — Telephone Encounter (Signed)
Printed rx and gave to Dr. Ninetta LightsHatcher to sign.  Pt would like a phone call with rx is ready to pick up.

## 2014-05-27 ENCOUNTER — Other Ambulatory Visit: Payer: Self-pay | Admitting: Infectious Diseases

## 2014-06-12 ENCOUNTER — Encounter (HOSPITAL_COMMUNITY): Payer: Self-pay | Admitting: Cardiology

## 2014-06-20 ENCOUNTER — Other Ambulatory Visit: Payer: Self-pay | Admitting: Licensed Clinical Social Worker

## 2014-06-20 DIAGNOSIS — G629 Polyneuropathy, unspecified: Secondary | ICD-10-CM

## 2014-06-20 MED ORDER — MORPHINE SULFATE ER 40 MG PO CP24: 40.0000 mg | ORAL_CAPSULE | Freq: Two times a day (BID) | ORAL | Status: DC

## 2014-06-23 ENCOUNTER — Other Ambulatory Visit: Payer: Self-pay | Admitting: Infectious Diseases

## 2014-06-23 DIAGNOSIS — B20 Human immunodeficiency virus [HIV] disease: Secondary | ICD-10-CM

## 2014-06-23 DIAGNOSIS — I10 Essential (primary) hypertension: Secondary | ICD-10-CM

## 2014-06-23 MED ORDER — EMTRICITAB-RILPIVIR-TENOFOV DF 200-25-300 MG PO TABS: 1.0000 | ORAL_TABLET | Freq: Every day | ORAL | Status: DC

## 2014-07-22 ENCOUNTER — Other Ambulatory Visit: Payer: Self-pay | Admitting: *Deleted

## 2014-07-22 DIAGNOSIS — G629 Polyneuropathy, unspecified: Secondary | ICD-10-CM

## 2014-07-22 MED ORDER — MORPHINE SULFATE ER 40 MG PO CP24: 40.0000 mg | ORAL_CAPSULE | Freq: Two times a day (BID) | ORAL | Status: DC

## 2014-07-22 NOTE — Telephone Encounter (Signed)
Printed rx and placed with signature paperwork for MD to sign.  Pt requesting pick up Wednesday or Thursday.

## 2014-08-20 ENCOUNTER — Other Ambulatory Visit: Payer: Self-pay | Admitting: *Deleted

## 2014-08-20 DIAGNOSIS — G629 Polyneuropathy, unspecified: Secondary | ICD-10-CM

## 2014-08-20 MED ORDER — MORPHINE SULFATE ER 40 MG PO CP24: 40.0000 mg | ORAL_CAPSULE | Freq: Two times a day (BID) | ORAL | Status: DC

## 2014-08-20 NOTE — Telephone Encounter (Signed)
Printed and placed in MD box for signature

## 2014-09-19 ENCOUNTER — Other Ambulatory Visit: Payer: Self-pay | Admitting: *Deleted

## 2014-09-19 DIAGNOSIS — G629 Polyneuropathy, unspecified: Secondary | ICD-10-CM

## 2014-09-19 MED ORDER — MORPHINE SULFATE ER 40 MG PO CP24: 40.0000 mg | ORAL_CAPSULE | Freq: Two times a day (BID) | ORAL | Status: DC

## 2014-09-22 ENCOUNTER — Other Ambulatory Visit: Payer: Self-pay | Admitting: Infectious Diseases

## 2014-09-22 DIAGNOSIS — K219 Gastro-esophageal reflux disease without esophagitis: Secondary | ICD-10-CM

## 2014-10-15 ENCOUNTER — Other Ambulatory Visit: Payer: Self-pay | Admitting: *Deleted

## 2014-10-15 ENCOUNTER — Other Ambulatory Visit: Payer: PRIVATE HEALTH INSURANCE

## 2014-10-15 DIAGNOSIS — B20 Human immunodeficiency virus [HIV] disease: Secondary | ICD-10-CM

## 2014-10-15 DIAGNOSIS — Z79899 Other long term (current) drug therapy: Secondary | ICD-10-CM

## 2014-10-15 DIAGNOSIS — Z113 Encounter for screening for infections with a predominantly sexual mode of transmission: Secondary | ICD-10-CM

## 2014-10-17 ENCOUNTER — Other Ambulatory Visit: Payer: Self-pay | Admitting: *Deleted

## 2014-10-17 DIAGNOSIS — G629 Polyneuropathy, unspecified: Secondary | ICD-10-CM

## 2014-10-17 MED ORDER — MORPHINE SULFATE ER 40 MG PO CP24: 40.0000 mg | ORAL_CAPSULE | Freq: Two times a day (BID) | ORAL | Status: DC

## 2014-10-20 ENCOUNTER — Other Ambulatory Visit: Payer: Self-pay | Admitting: Infectious Diseases

## 2014-10-20 DIAGNOSIS — I10 Essential (primary) hypertension: Secondary | ICD-10-CM

## 2014-10-24 ENCOUNTER — Other Ambulatory Visit: Payer: Medicaid Other

## 2014-10-24 DIAGNOSIS — Z113 Encounter for screening for infections with a predominantly sexual mode of transmission: Secondary | ICD-10-CM | POA: Diagnosis not present

## 2014-10-24 DIAGNOSIS — B20 Human immunodeficiency virus [HIV] disease: Secondary | ICD-10-CM | POA: Diagnosis not present

## 2014-10-24 DIAGNOSIS — Z79899 Other long term (current) drug therapy: Secondary | ICD-10-CM

## 2014-10-24 LAB — CBC WITH DIFFERENTIAL/PLATELET
BASOS PCT: 0 % (ref 0–1)
Basophils Absolute: 0 10*3/uL (ref 0.0–0.1)
Eosinophils Absolute: 0.3 10*3/uL (ref 0.0–0.7)
Eosinophils Relative: 6 % — ABNORMAL HIGH (ref 0–5)
HEMATOCRIT: 37.7 % — AB (ref 39.0–52.0)
Hemoglobin: 12.6 g/dL — ABNORMAL LOW (ref 13.0–17.0)
LYMPHS ABS: 1 10*3/uL (ref 0.7–4.0)
LYMPHS PCT: 20 % (ref 12–46)
MCH: 27.8 pg (ref 26.0–34.0)
MCHC: 33.4 g/dL (ref 30.0–36.0)
MCV: 83 fL (ref 78.0–100.0)
MONO ABS: 0.3 10*3/uL (ref 0.1–1.0)
MONOS PCT: 7 % (ref 3–12)
MPV: 10.1 fL (ref 8.6–12.4)
Neutro Abs: 3.3 10*3/uL (ref 1.7–7.7)
Neutrophils Relative %: 67 % (ref 43–77)
Platelets: 161 10*3/uL (ref 150–400)
RBC: 4.54 MIL/uL (ref 4.22–5.81)
RDW: 15.9 % — ABNORMAL HIGH (ref 11.5–15.5)
WBC: 4.9 10*3/uL (ref 4.0–10.5)

## 2014-10-24 LAB — COMPLETE METABOLIC PANEL WITH GFR
ALK PHOS: 127 U/L — AB (ref 39–117)
ALT: 9 U/L (ref 0–53)
AST: 9 U/L (ref 0–37)
Albumin: 3.6 g/dL (ref 3.5–5.2)
BILIRUBIN TOTAL: 0.4 mg/dL (ref 0.2–1.2)
BUN: 9 mg/dL (ref 6–23)
CO2: 28 meq/L (ref 19–32)
CREATININE: 0.94 mg/dL (ref 0.50–1.35)
Calcium: 8.6 mg/dL (ref 8.4–10.5)
Chloride: 102 mEq/L (ref 96–112)
GLUCOSE: 85 mg/dL (ref 70–99)
Potassium: 3.9 mEq/L (ref 3.5–5.3)
Sodium: 140 mEq/L (ref 135–145)
Total Protein: 6.7 g/dL (ref 6.0–8.3)

## 2014-10-24 LAB — LIPID PANEL
Cholesterol: 157 mg/dL (ref 0–200)
HDL: 30 mg/dL — ABNORMAL LOW (ref >=40)
LDL Cholesterol: 98 mg/dL (ref 0–99)
TRIGLYCERIDES: 145 mg/dL (ref <150)
Total CHOL/HDL Ratio: 5.2 Ratio
VLDL: 29 mg/dL (ref 0–40)

## 2014-10-24 LAB — T-HELPER CELL (CD4) - (RCID CLINIC ONLY)
CD4 % Helper T Cell: 9 % — ABNORMAL LOW (ref 33–55)
CD4 T CELL ABS: 90 /uL — AB (ref 400–2700)

## 2014-10-25 LAB — RPR

## 2014-10-28 LAB — HIV-1 RNA QUANT-NO REFLEX-BLD
HIV 1 RNA Quant: <20 copies/mL (ref <20)
HIV-1 RNA Quant, Log: <1.3 {Log} (ref <1.30)

## 2014-10-29 ENCOUNTER — Encounter: Payer: Self-pay | Admitting: Infectious Diseases

## 2014-10-29 ENCOUNTER — Ambulatory Visit (INDEPENDENT_AMBULATORY_CARE_PROVIDER_SITE_OTHER): Payer: Medicare Other | Admitting: Infectious Diseases

## 2014-10-29 VITALS — BP 118/77 | HR 90 | Temp 98.0°F | Wt 224.0 lb

## 2014-10-29 DIAGNOSIS — Q8740 Marfan's syndrome, unspecified: Secondary | ICD-10-CM | POA: Diagnosis not present

## 2014-10-29 DIAGNOSIS — Z720 Tobacco use: Secondary | ICD-10-CM

## 2014-10-29 DIAGNOSIS — L97509 Non-pressure chronic ulcer of other part of unspecified foot with unspecified severity: Secondary | ICD-10-CM | POA: Insufficient documentation

## 2014-10-29 DIAGNOSIS — B20 Human immunodeficiency virus [HIV] disease: Secondary | ICD-10-CM

## 2014-10-29 DIAGNOSIS — L97521 Non-pressure chronic ulcer of other part of left foot limited to breakdown of skin: Secondary | ICD-10-CM

## 2014-10-29 DIAGNOSIS — F329 Major depressive disorder, single episode, unspecified: Secondary | ICD-10-CM | POA: Diagnosis not present

## 2014-10-29 DIAGNOSIS — F17200 Nicotine dependence, unspecified, uncomplicated: Secondary | ICD-10-CM

## 2014-10-29 DIAGNOSIS — F32A Depression, unspecified: Secondary | ICD-10-CM

## 2014-10-29 NOTE — Assessment & Plan Note (Signed)
Has psych f/u.

## 2014-10-29 NOTE — Progress Notes (Signed)
   Subjective:    Patient ID: [name], male    DOB: May 04, 1961, 54 y.o.   MRN: [id number]  HPI  54 yo M with neuropathy, HIV/AIDS, DM that is diet controlled, suspected Marfan's syndrome, and COPD. He was started on complera 2012.  Last TTE 01-2013- mildto mod MR otherwise nl. EF nl.  Was in hospital 09-2013 with sustained VT. His cath was normal. He was treated medically.   HIV 1 RNA QUANT (copies/mL)  Date Value  10/24/2014 <20  03/03/2014 23*  09/09/2013 <20   CD4 T CELL ABS (/uL)  Date Value  10/24/2014 90*  03/03/2014 100*  09/09/2013 100*   Has new sore on his L heel. Has seen derm and has gel that he is applying to this.Marland Kitchen.  Has had episode of severe diarrhea, afebrile. Since resolved. His missed his ART 3 nights during this episode.  Worried that his neighbors are using drugs.  Has cut back his smoking, 1/4 ppd.  Seeing psych   Review of Systems  Constitutional: Negative for fever, chills, appetite change and unexpected weight change.  Gastrointestinal: Negative for diarrhea and constipation.  Genitourinary: Negative for difficulty urinating.      Objective:   Physical Exam  Constitutional: He appears well-developed and well-nourished.  HENT:  Mouth/Throat: No oropharyngeal exudate.  Eyes: EOM are normal. Pupils are equal, round, and reactive to light.  Neck: Neck supple.  Cardiovascular: Normal rate, regular rhythm and normal heart sounds.   Pulmonary/Chest: Effort normal and breath sounds normal.  Abdominal: Soft. Bowel sounds are normal. He exhibits no distension. There is no tenderness.  Musculoskeletal: He exhibits edema.       Feet:  Lymphadenopathy:    He has no cervical adenopathy.       Assessment & Plan:

## 2014-10-29 NOTE — Assessment & Plan Note (Signed)
Appears to be healing well. Will hold on wound care eval  He is going to see podiatry for his onychomycosis.

## 2014-10-29 NOTE — Assessment & Plan Note (Signed)
He is doing well, had slight dip in VL.  Not sexually active.  Working on moving.  rtc 6 months.

## 2014-10-29 NOTE — Assessment & Plan Note (Signed)
Will check f/u TTE

## 2014-10-29 NOTE — Assessment & Plan Note (Signed)
Is working on cutting back.

## 2014-11-17 ENCOUNTER — Other Ambulatory Visit: Payer: Self-pay | Admitting: *Deleted

## 2014-11-17 DIAGNOSIS — G629 Polyneuropathy, unspecified: Secondary | ICD-10-CM

## 2014-11-17 MED ORDER — MORPHINE SULFATE ER 40 MG PO CP24: 40.0000 mg | ORAL_CAPSULE | Freq: Two times a day (BID) | ORAL | Status: DC

## 2014-12-04 ENCOUNTER — Telehealth: Payer: Self-pay | Admitting: Licensed Clinical Social Worker

## 2014-12-04 NOTE — Telephone Encounter (Signed)
Patient called stating that he had cough, fever, congestion for 7 days and thought he had bronchitis. Looked for appointment and did not see one available until Monday. Patient wanted that appointment and did not want to be see earlier at the ED or Urgent Care. Appointment made

## 2014-12-08 ENCOUNTER — Encounter: Payer: Self-pay | Admitting: Infectious Disease

## 2014-12-08 ENCOUNTER — Ambulatory Visit
Admission: RE | Admit: 2014-12-08 | Discharge: 2014-12-08 | Disposition: A | Discharge status: Home / Self Care | Payer: Medicare Other | Source: Ambulatory Visit | Attending: Infectious Disease | Admitting: Infectious Disease

## 2014-12-08 ENCOUNTER — Ambulatory Visit (INDEPENDENT_AMBULATORY_CARE_PROVIDER_SITE_OTHER): Payer: Medicare Other | Admitting: Infectious Disease

## 2014-12-08 VITALS — BP 112/77 | HR 91 | Temp 97.7°F | Wt 212.0 lb

## 2014-12-08 DIAGNOSIS — R059 Cough, unspecified: Secondary | ICD-10-CM

## 2014-12-08 DIAGNOSIS — J208 Acute bronchitis due to other specified organisms: Secondary | ICD-10-CM

## 2014-12-08 DIAGNOSIS — J441 Chronic obstructive pulmonary disease with (acute) exacerbation: Secondary | ICD-10-CM | POA: Diagnosis not present

## 2014-12-08 DIAGNOSIS — R05 Cough: Secondary | ICD-10-CM

## 2014-12-08 DIAGNOSIS — L97529 Non-pressure chronic ulcer of other part of left foot with unspecified severity: Secondary | ICD-10-CM

## 2014-12-08 DIAGNOSIS — Q8740 Marfan's syndrome, unspecified: Secondary | ICD-10-CM

## 2014-12-08 DIAGNOSIS — J310 Chronic rhinitis: Secondary | ICD-10-CM

## 2014-12-08 DIAGNOSIS — E11621 Type 2 diabetes mellitus with foot ulcer: Secondary | ICD-10-CM

## 2014-12-08 DIAGNOSIS — J329 Chronic sinusitis, unspecified: Secondary | ICD-10-CM | POA: Insufficient documentation

## 2014-12-08 DIAGNOSIS — B350 Tinea barbae and tinea capitis: Secondary | ICD-10-CM

## 2014-12-08 DIAGNOSIS — B20 Human immunodeficiency virus [HIV] disease: Secondary | ICD-10-CM

## 2014-12-08 DIAGNOSIS — L738 Other specified follicular disorders: Secondary | ICD-10-CM

## 2014-12-08 DIAGNOSIS — J849 Interstitial pulmonary disease, unspecified: Secondary | ICD-10-CM | POA: Insufficient documentation

## 2014-12-08 HISTORY — DX: Chronic sinusitis, unspecified: J32.9

## 2014-12-08 HISTORY — DX: Other specified follicular disorders: L73.8

## 2014-12-08 HISTORY — DX: Chronic rhinitis: J31.0

## 2014-12-08 HISTORY — DX: Interstitial pulmonary disease, unspecified: J84.9

## 2014-12-08 LAB — COMPLETE METABOLIC PANEL WITH GFR
ALBUMIN: 3.9 g/dL (ref 3.5–5.2)
ALK PHOS: 123 U/L — AB (ref 39–117)
ALT: <8 U/L (ref 0–53)
AST: 10 U/L (ref 0–37)
BUN: 16 mg/dL (ref 6–23)
CALCIUM: 9.7 mg/dL (ref 8.4–10.5)
CO2: 23 mEq/L (ref 19–32)
CREATININE: 1.15 mg/dL (ref 0.50–1.35)
Chloride: 100 mEq/L (ref 96–112)
GFR, Est African American: 83 mL/min
GFR, Est Non African American: 72 mL/min
GLUCOSE: 106 mg/dL — AB (ref 70–99)
Potassium: 4.1 mEq/L (ref 3.5–5.3)
Sodium: 136 mEq/L (ref 135–145)
Total Bilirubin: 0.9 mg/dL (ref 0.2–1.2)
Total Protein: 7.3 g/dL (ref 6.0–8.3)

## 2014-12-08 LAB — CBC WITH DIFFERENTIAL/PLATELET
BASOS ABS: 0 10*3/uL (ref 0.0–0.1)
Basophils Relative: 0 % (ref 0–1)
EOS ABS: 0.1 10*3/uL (ref 0.0–0.7)
Eosinophils Relative: 1 % (ref 0–5)
HEMATOCRIT: 40.1 % (ref 39.0–52.0)
Hemoglobin: 13.4 g/dL (ref 13.0–17.0)
Lymphocytes Relative: 17 % (ref 12–46)
Lymphs Abs: 1.2 10*3/uL (ref 0.7–4.0)
MCH: 27.3 pg (ref 26.0–34.0)
MCHC: 33.4 g/dL (ref 30.0–36.0)
MCV: 81.8 fL (ref 78.0–100.0)
MPV: 9.2 fL (ref 8.6–12.4)
Monocytes Absolute: 0.4 10*3/uL (ref 0.1–1.0)
Monocytes Relative: 6 % (ref 3–12)
Neutro Abs: 5.2 10*3/uL (ref 1.7–7.7)
Neutrophils Relative %: 76 % (ref 43–77)
Platelets: 259 10*3/uL (ref 150–400)
RBC: 4.9 MIL/uL (ref 4.22–5.81)
RDW: 15.1 % (ref 11.5–15.5)
WBC: 6.8 10*3/uL (ref 4.0–10.5)

## 2014-12-08 MED ORDER — AZITHROMYCIN 250 MG PO TABS: ORAL_TABLET | ORAL | Status: DC

## 2014-12-08 NOTE — Progress Notes (Signed)
   Subjective:    Patient ID: [name], male    DOB: [birth year], 54 y.o.   MRN: [id number]  HPI  54 year old with history of HIV/AIDS with perfect virological control but with difficulty raising his CD4 much higher than 90s to 100s.  Lab Results  Component Value Date   HIV1RNAQUANT <20 10/24/2014   Lab Results  Component Value Date   CD4TABS 90* 10/24/2014   CD4TABS 100* 03/03/2014   CD4TABS 100* 09/09/2013    He has comorbid Marfans, COPD, ILD who has had several week hx of sinus congestion, that has progressed to productive cough with fevers to 101, but no rigors.   Review of Systems  Constitutional: Negative for fever, chills, diaphoresis, activity change, appetite change, fatigue and unexpected weight change.  HENT: Positive for congestion, postnasal drip, rhinorrhea, sinus pressure, sore throat and trouble swallowing. Negative for sneezing.   Eyes: Negative for photophobia and visual disturbance.  Respiratory: Positive for cough and shortness of breath. Negative for chest tightness, wheezing and stridor.   Cardiovascular: Negative for chest pain, palpitations and leg swelling.  Gastrointestinal: Negative for nausea, vomiting, abdominal pain, diarrhea, constipation, blood in stool, abdominal distention and anal bleeding.  Genitourinary: Negative for dysuria, hematuria, flank pain and difficulty urinating.  Musculoskeletal: Negative for myalgias, back pain, joint swelling, arthralgias and gait problem.  Skin: Negative for color change, pallor, rash and wound.  Neurological: Negative for dizziness, tremors, weakness and light-headedness.  Hematological: Negative for adenopathy. Does not bruise/bleed easily.  Psychiatric/Behavioral: Negative for behavioral problems, confusion, sleep disturbance, dysphoric mood, decreased concentration and agitation.       Objective:   Physical Exam  Constitutional: He is oriented to person, place, and time. He appears well-developed and  well-nourished.  HENT:  Head: Normocephalic and atraumatic.  Mouth/Throat: Oropharynx is clear and moist. No oropharyngeal exudate.  Eyes: Conjunctivae and EOM are normal.  Neck: Normal range of motion. Neck supple.  Cardiovascular: Normal rate, regular rhythm and normal heart sounds.  Exam reveals no gallop and no friction rub.   No murmur heard. Pulmonary/Chest: Effort normal and breath sounds normal. No respiratory distress. He has no wheezes.  Prolonged expiratory phase  Abdominal: Soft. He exhibits no distension.  Musculoskeletal: Normal range of motion. He exhibits no edema or tenderness.  Neurological: He is alert and oriented to person, place, and time.  Skin: Skin is warm and dry. Rash noted. No erythema.  Psychiatric: His behavior is normal. Thought content normal.  Nursing note and vitals reviewed.   Facial rash: he states this happens when he does not shave regularly and apply specific ointment  12/08/2014:    OP: with coating on tongue and no thrush in posterior oropharynx 12/08/2014  New skin lesion on right hand: he is worried for KS --doubtful 12/08/14:          Assessment & Plan:   New Cough, fevers: rhino-sinusitis evolved into bronchitis, possible COPD exacerbation  I checked CXR which did NOT show an acute infiiltrate  I wrote for [employer], if does not improve would re-image and add systemic corticosteroids for COPD reactive airway disease  Follicultiis barbae: chronic issue  New skin lesion hand: monitor and followup with Dr Ninetta LightsHatcher.  Marfans: being followed by Cardiology  HIV: would change to ODEFSEY when covered  ILD: read by CXR but do not know if he has had PFTS to show this.

## 2014-12-15 ENCOUNTER — Other Ambulatory Visit: Payer: Self-pay | Admitting: *Deleted

## 2014-12-15 DIAGNOSIS — G629 Polyneuropathy, unspecified: Secondary | ICD-10-CM

## 2014-12-15 MED ORDER — MORPHINE SULFATE ER 40 MG PO CP24: 40.0000 mg | ORAL_CAPSULE | Freq: Two times a day (BID) | ORAL | Status: DC

## 2015-01-14 ENCOUNTER — Other Ambulatory Visit: Payer: Self-pay | Admitting: Infectious Diseases

## 2015-01-14 ENCOUNTER — Other Ambulatory Visit: Payer: Self-pay | Admitting: *Deleted

## 2015-01-14 DIAGNOSIS — G629 Polyneuropathy, unspecified: Secondary | ICD-10-CM

## 2015-01-14 MED ORDER — MORPHINE SULFATE ER 40 MG PO CP24: 40.0000 mg | ORAL_CAPSULE | Freq: Two times a day (BID) | ORAL | Status: DC

## 2015-01-26 ENCOUNTER — Other Ambulatory Visit: Payer: Self-pay | Admitting: Infectious Diseases

## 2015-02-09 ENCOUNTER — Telehealth: Payer: Self-pay | Admitting: Internal Medicine

## 2015-02-09 ENCOUNTER — Encounter: Payer: Self-pay | Admitting: *Deleted

## 2015-02-09 DIAGNOSIS — I10 Essential (primary) hypertension: Secondary | ICD-10-CM

## 2015-02-09 NOTE — Telephone Encounter (Signed)
Pt c/o BP issue: Heart Rate   1. What are your last 5 Heart rate readings? No Clear cut readings  2. Are you having any other symptoms (ex. Dizziness, headache, blurred vision, passed out)?  Dizziness   3. What is your medication issue? Pt called states that he is taking Metroprolol 25 MG twice a day. Pt called states that he fell over the weekend. Soreness around his sternum. He reports that he was ok but after that he became numb. After the fall he didn't monitor the heart rate until he got home this past weekend on 02/06/2015 . He then took away one pill of Metroprolol to see if it would decrease the dizziness. After taking away the one pill he was still dizzy but his heart rate wasn't making it to 80. As of right now his heart rate is 96. Pt requests a call back to discuss if he will need to be seen today.   When he rises to stand he rises slowly and he is dizzy.

## 2015-02-09 NOTE — Telephone Encounter (Signed)
I spoke with the patient. He reports that he had a fall on Friday 02/06/15 after getting out of a car and walking up a few steps to a platform. He denies feeling dizzy prior to this, but did feel dizzy afterward. He hit his sternum and his head, but states he feels ok. He has not been monitoring his BP or HR historically, but did start monitoring his HR after he fell and developed his dizziness. HR was running 53-55 bpm Friday/ Saturday. The patient reports that he has been on metoprolol 25 mg BID since he saw Dr. Ladona Ridgel 10/2013, but only took one dose about mid day yesterday. His HR last night was 76-81 bpm. This morning his HR has been 102-105 bpm and regular. He took a dose of metoprolol 25 mg at 9:50 am today. He feels that after cutting his metoprolol to one dose yesterday, that his dizziness has subsided substantially.  He tells me that he has neuropathy as well and will fall from time to time. He is a 1 PPD smoker, but has not smoked since Friday. He states he is staying hydrated, but this includes caffeinated coffee and tea with some water. I have advised the patient that coffee and tea can dehydrate you. I have asked that he monitor his BP and HR both. Reviewed with Dr. Mayford Knife- recommendations to decrease metoprolol tartrate to 25 mg 1/2 tablet (12.5 mg) BID. The patient is aware.  He is overdue for follow up with Dr. Ladona Ridgel- an appointment has been scheduled for 03/18/15. He will notify us if his symptoms do not improve prior to this.

## 2015-02-11 ENCOUNTER — Telehealth: Payer: Self-pay | Admitting: *Deleted

## 2015-02-11 DIAGNOSIS — G629 Polyneuropathy, unspecified: Secondary | ICD-10-CM

## 2015-02-11 MED ORDER — MORPHINE SULFATE ER 40 MG PO CP24: 40.0000 mg | ORAL_CAPSULE | Freq: Two times a day (BID) | ORAL | Status: DC

## 2015-02-11 NOTE — Telephone Encounter (Signed)
Patient called asking for refill of his pain medication. Printed for Dr. Orvan Falconer to sign in Dr. Moshe Cipro place.

## 2015-02-12 ENCOUNTER — Other Ambulatory Visit: Payer: Self-pay | Admitting: Infectious Diseases

## 2015-02-12 ENCOUNTER — Other Ambulatory Visit: Payer: Self-pay | Admitting: Internal Medicine

## 2015-02-12 DIAGNOSIS — I10 Essential (primary) hypertension: Secondary | ICD-10-CM

## 2015-02-12 MED ORDER — METOPROLOL TARTRATE 25 MG PO TABS: 12.5000 mg | ORAL_TABLET | Freq: Two times a day (BID) | ORAL | Status: DC

## 2015-03-02 DIAGNOSIS — H903 Sensorineural hearing loss, bilateral: Secondary | ICD-10-CM | POA: Diagnosis not present

## 2015-03-02 DIAGNOSIS — R42 Dizziness and giddiness: Secondary | ICD-10-CM | POA: Diagnosis not present

## 2015-03-12 ENCOUNTER — Other Ambulatory Visit: Payer: Self-pay | Admitting: Infectious Diseases

## 2015-03-12 ENCOUNTER — Other Ambulatory Visit: Payer: Self-pay | Admitting: *Deleted

## 2015-03-12 DIAGNOSIS — G629 Polyneuropathy, unspecified: Secondary | ICD-10-CM

## 2015-03-12 MED ORDER — MORPHINE SULFATE ER 40 MG PO CP24: 40.0000 mg | ORAL_CAPSULE | Freq: Two times a day (BID) | ORAL | Status: DC

## 2015-03-18 ENCOUNTER — Ambulatory Visit (INDEPENDENT_AMBULATORY_CARE_PROVIDER_SITE_OTHER): Payer: Medicare Other | Admitting: Internal Medicine

## 2015-03-18 ENCOUNTER — Encounter: Payer: Self-pay | Admitting: Internal Medicine

## 2015-03-18 VITALS — BP 110/82 | HR 84 | Ht 75.0 in | Wt 217.2 lb

## 2015-03-18 DIAGNOSIS — I472 Ventricular tachycardia, unspecified: Secondary | ICD-10-CM

## 2015-03-18 DIAGNOSIS — Q8740 Marfan's syndrome, unspecified: Secondary | ICD-10-CM

## 2015-03-18 DIAGNOSIS — R600 Localized edema: Secondary | ICD-10-CM | POA: Diagnosis not present

## 2015-03-18 NOTE — Assessment & Plan Note (Signed)
He has MR by exam. He had moderate MR 2 years ago. He is asymptomatic. Will plan to check another echo when we see him back in a year.

## 2015-03-18 NOTE — Progress Notes (Signed)
HPI Mr. Mans returns today for followup. He is a pleasant 54 yo man with Marfan's syndrome, who presented with sustained VT. He underwent evaluation and was found on MRI that he had basilar thining with preserved LV function. He was placed on a beta blocker. He did not have syncope. He has done well in the interim. He admits to dietary indiscretion. No sob. Modest peripheral edema. Allergies  Allergen Reactions  . Tetracycline Hcl Swelling    Lips, facial swelling     Current Outpatient Prescriptions  Medication Sig Dispense Refill  . ALPRAZolam (XANAX) 0.5 MG tablet Take 0.5 mg by mouth 3 (three) times daily.     . clonazePAM (KLONOPIN) 1 MG tablet Take 1 mg by mouth at bedtime.    . COMPLERA 200-25-300 MG TABS TAKE 1 TABLET BY MOUTH DAILY. 30 tablet 6  . desonide (DESOWEN) 0.05 % ointment     . Emtricitab-Rilpivir-Tenofovir (COMPLERA) 200-25-300 MG TABS Take 1 tablet by mouth daily. 30 tablet 6  . furosemide (LASIX) 40 MG tablet Take 20 mg by mouth every morning.    . meclizine (ANTIVERT) 25 MG tablet Take 1 tablet by mouth daily as needed. DIzziness  1  . metoprolol tartrate (LOPRESSOR) 25 MG tablet Take 1/2 tablet (12.5 mg)  by mouth one to two times a day    . Morphine Sulfate 40 MG CP24 Take 40 mg by mouth 2 (two) times daily. 60 capsule 0  . nortriptyline (PAMELOR) 75 MG capsule Take 150 mg by mouth at bedtime.    Marland Kitchen omeprazole (PRILOSEC) 40 MG capsule Take 40 mg by mouth daily as needed (heartburn).    . promethazine (PHENERGAN) 25 MG tablet Take 25 mg by mouth every 8 (eight) hours as needed for nausea or vomiting.    Marland Kitchen spironolactone (ALDACTONE) 25 MG tablet TAKE 1 TABLET BY MOUTH ONCE DAILY. 30 tablet PRN  . sulfamethoxazole-trimethoprim (BACTRIM DS,SEPTRA DS) 800-160 MG per tablet Take 1 tablet by mouth 3 times a week as needed for infection     No current facility-administered medications for this visit.     Past Medical History  Diagnosis Date  . Chronic  hepatitis B   . HIV (human immunodeficiency virus infection)   . Cholesteatoma   . Depression   . Marfan syndrome     Dr Ninetta Lights follows  . Venous insufficiency (chronic) (peripheral)   . Mitral valve prolapse   . VT (ventricular tachycardia)   . COPD (chronic obstructive pulmonary disease)   . COPD exacerbation 12/08/2014  . Folliculitis barbae 12/08/2014  . Rhinosinusitis 12/08/2014  . ILD (interstitial lung disease) 12/08/2014    ROS:   All systems reviewed and negative except as noted in the HPI.   Past Surgical History  Procedure Laterality Date  . Cholesteotoma removal    . Appendectomy    . Repair of perforated colon    . Left heart catheterization with coronary angiogram [name] 09/24/2013    Procedure: LEFT HEART CATHETERIZATION WITH CORONARY ANGIOGRAM;  Surgeon: Marykay Lex, MD;  Location: [facility] CATH LAB;  Service: Cardiovascular;  Laterality: [name];     Family History  Problem Relation Age of Onset  . Cancer Mother   . Hypertension Mother   . Stroke Sister   . Hypertension Sister   . Aneurysm Sister      Social History   Social History  . Marital Status: Single    Spouse Name: [name]  . Number of Children: [name]  .  Years of Education: [name]   Occupational History  . Not on file.   Social History Main Topics  . Smoking status: Current Some Day Smoker -- 0.25 packs/day for 35 years    Types: Cigarettes  . Smokeless tobacco: Never Used     Comment: trying to cut back  . Alcohol Use: No  . Drug Use: No  . Sexual Activity: No     Comment: pt. declined condoms   Other Topics Concern  . Not on file   Social History Narrative     BP 110/82 mmHg  Pulse 84  Ht 6\' 3"  (1.905 m)  Wt 217 lb 3.2 oz (98.521 kg)  BMI 27.15 kg/m2  Physical Exam:  Well appearing middle aged man, NAD HEENT: Unremarkable except for characteristic Marfan's facies Neck:  7 cm JVD, no thyromegally Back:  No CVA tenderness Lungs:  Clear with no wheezes HEART:  Regular rate rhythm, 3/6   Murmur of MR, no rubs, no clicks Abd:  soft, positive obese, bowel sounds, no organomegally, no rebound, no guarding Ext:  2 plus pulses, no edema, no cyanosis, no clubbing Skin:  No rashes no nodules Neuro:  CN II through XII intact, motor grossly intact  EKG - nsr with LVH  Assess/Plan:

## 2015-03-18 NOTE — Assessment & Plan Note (Signed)
He has venous insufficiency. I have asked him to wear his support stockings and reduce his sodium intake and keep his legs elevated.

## 2015-03-18 NOTE — Assessment & Plan Note (Signed)
He has had no recurrent ventricular arrhythmias and no syncope. He will continue his beta blocker and undergo watchful waiting.

## 2015-03-18 NOTE — Patient Instructions (Signed)
Medication Instructions:  Your physician recommends that you continue on your current medications as directed. Please refer to the Current Medication list given to you today.  Labwork: None ordered  Testing/Procedures: None ordered  Follow-Up: Your physician wants you to follow-up in: 1 year with Dr. Taylor. You will receive a reminder letter in the mail two months in advance. If you don't receive a letter, please call our office to schedule the follow-up appointment.  Any Other Special Instructions Will Be Listed Below (If Applicable). Thank you for choosing [facility]!!       

## 2015-03-20 ENCOUNTER — Other Ambulatory Visit: Payer: Self-pay

## 2015-03-20 NOTE — Telephone Encounter (Signed)
Approved      Disp Refills Start End    furosemide (LASIX) 40 MG tablet 30 tablet 6 02/27/2014 03/12/2014    Sig:  TAKE 1/2 TABLET (20 MG TOTAL) BY MOUTH DAILY.    Class:  Normal    DAW:  No    Authorizing Provider:  Ginnie Smart, MD    Ordering User:  Harvie Bridge

## 2015-03-24 ENCOUNTER — Other Ambulatory Visit: Payer: Medicare Other

## 2015-03-24 ENCOUNTER — Telehealth: Payer: Self-pay | Admitting: *Deleted

## 2015-03-24 ENCOUNTER — Ambulatory Visit (INDEPENDENT_AMBULATORY_CARE_PROVIDER_SITE_OTHER): Payer: Medicare Other | Admitting: *Deleted

## 2015-03-24 DIAGNOSIS — B20 Human immunodeficiency virus [HIV] disease: Secondary | ICD-10-CM

## 2015-03-24 DIAGNOSIS — Z23 Encounter for immunization: Secondary | ICD-10-CM | POA: Diagnosis not present

## 2015-03-24 LAB — COMPREHENSIVE METABOLIC PANEL
ALBUMIN: 4.2 g/dL (ref 3.6–5.1)
ALK PHOS: 125 U/L — AB (ref 40–115)
ALT: 11 U/L (ref 9–46)
AST: 12 U/L (ref 10–35)
BUN: 15 mg/dL (ref 7–25)
CO2: 26 mmol/L (ref 20–31)
CREATININE: 0.99 mg/dL (ref 0.70–1.33)
Calcium: 9.6 mg/dL (ref 8.6–10.3)
Chloride: 100 mmol/L (ref 98–110)
Glucose, Bld: 132 mg/dL — ABNORMAL HIGH (ref 65–99)
Potassium: 4.4 mmol/L (ref 3.5–5.3)
SODIUM: 136 mmol/L (ref 135–146)
TOTAL PROTEIN: 7.5 g/dL (ref 6.1–8.1)
Total Bilirubin: 0.5 mg/dL (ref 0.2–1.2)

## 2015-03-24 LAB — CBC
HCT: 40.9 % (ref 39.0–52.0)
Hemoglobin: 14.3 g/dL (ref 13.0–17.0)
MCH: 28.6 pg (ref 26.0–34.0)
MCHC: 35 g/dL (ref 30.0–36.0)
MCV: 81.8 fL (ref 78.0–100.0)
MPV: 9.4 fL (ref 8.6–12.4)
PLATELETS: 181 10*3/uL (ref 150–400)
RBC: 5 MIL/uL (ref 4.22–5.81)
RDW: 16 % — AB (ref 11.5–15.5)
WBC: 4.3 10*3/uL (ref 4.0–10.5)

## 2015-03-24 NOTE — Telephone Encounter (Signed)
Please send referal or have [name] send referal thanks

## 2015-03-24 NOTE — Telephone Encounter (Signed)
Patient here for labs, flu shot.  He asked to speak with this RN, stating his "water blisters" have returned on his feet.  He would like a referral to a podiatrist.  Patient showed this RN one blister on the ball of his left foot.  It is about the size of a half dollar, has minimal serous drainage with white macerated edges, a pink wound bed.  Patient keeps it covered with "ointment" and a bandaid.  Patient states he has another, smaller one on his right foot.   Patient scheduled for follow up 10/12.  Please advise if you would like to send the referral before the appointment or after you assess the wounds. Andree Coss, RN

## 2015-03-25 LAB — T-HELPER CELL (CD4) - (RCID CLINIC ONLY)
CD4 T CELL HELPER: 7 % — AB (ref 33–55)
CD4 T Cell Abs: 70 /uL — ABNORMAL LOW (ref 400–2700)

## 2015-03-25 LAB — HIV-1 RNA QUANT-NO REFLEX-BLD: HIV 1 RNA Quant: <20 copies/mL (ref <20)

## 2015-03-27 ENCOUNTER — Other Ambulatory Visit: Payer: Self-pay | Admitting: *Deleted

## 2015-03-27 DIAGNOSIS — I739 Peripheral vascular disease, unspecified: Secondary | ICD-10-CM

## 2015-03-27 DIAGNOSIS — B20 Human immunodeficiency virus [HIV] disease: Secondary | ICD-10-CM

## 2015-03-27 DIAGNOSIS — G63 Polyneuropathy in diseases classified elsewhere: Principal | ICD-10-CM

## 2015-03-27 DIAGNOSIS — L97509 Non-pressure chronic ulcer of other part of unspecified foot with unspecified severity: Secondary | ICD-10-CM

## 2015-03-27 NOTE — Telephone Encounter (Signed)
Referral sent to Podiatry.  °

## 2015-04-01 ENCOUNTER — Ambulatory Visit: Payer: Self-pay | Admitting: Podiatry

## 2015-04-07 ENCOUNTER — Other Ambulatory Visit: Payer: Self-pay | Admitting: Infectious Diseases

## 2015-04-07 DIAGNOSIS — G629 Polyneuropathy, unspecified: Secondary | ICD-10-CM

## 2015-04-07 MED ORDER — MORPHINE SULFATE ER 40 MG PO CP24: 40.0000 mg | ORAL_CAPSULE | Freq: Two times a day (BID) | ORAL | Status: DC

## 2015-04-09 ENCOUNTER — Other Ambulatory Visit: Payer: Self-pay | Admitting: Internal Medicine

## 2015-04-09 NOTE — Telephone Encounter (Signed)
Should this be ordered as requested, or as listed on last office visit,  metoprolol tartrate (LOPRESSOR) 25 MG tablet Take 1/2 tablet (12.5 mg) by mouth one to two times a day         Please advise. Thanks, MI

## 2015-04-09 NOTE — Telephone Encounter (Signed)
Spoke with patient and he stated that he actually takes 1/4 of a tablet if his heart rate is above 100. He says that if his heart rate is slow he doesn't take any metoprolol. He stated that the metoprolol makes him feel dizzy as if he is on a carnival ride. He would like to switch back to labetalol or atenolol which he was on years ago. Please advise. Thanks, MI

## 2015-04-09 NOTE — Telephone Encounter (Signed)
Call pt to confirm how he is taking

## 2015-04-15 ENCOUNTER — Ambulatory Visit (INDEPENDENT_AMBULATORY_CARE_PROVIDER_SITE_OTHER): Payer: Medicare Other | Admitting: Infectious Diseases

## 2015-04-15 ENCOUNTER — Encounter: Payer: Self-pay | Admitting: Infectious Diseases

## 2015-04-15 VITALS — BP 111/72 | HR 97 | Temp 98.4°F | Wt 214.0 lb

## 2015-04-15 DIAGNOSIS — B20 Human immunodeficiency virus [HIV] disease: Secondary | ICD-10-CM

## 2015-04-15 DIAGNOSIS — I83029 Varicose veins of left lower extremity with ulcer of unspecified site: Secondary | ICD-10-CM | POA: Diagnosis not present

## 2015-04-15 DIAGNOSIS — E1142 Type 2 diabetes mellitus with diabetic polyneuropathy: Secondary | ICD-10-CM

## 2015-04-15 DIAGNOSIS — F17200 Nicotine dependence, unspecified, uncomplicated: Secondary | ICD-10-CM | POA: Diagnosis not present

## 2015-04-15 DIAGNOSIS — L97929 Non-pressure chronic ulcer of unspecified part of left lower leg with unspecified severity: Secondary | ICD-10-CM

## 2015-04-15 MED ORDER — EMTRICITAB-RILPIVIR-TENOFOV AF 200-25-25 MG PO TABS: 1.0000 | ORAL_TABLET | Freq: Every day | ORAL | Status: DC

## 2015-04-15 NOTE — Assessment & Plan Note (Signed)
Needs to f/u with PCP

## 2015-04-15 NOTE — Progress Notes (Signed)
   Subjective:    Patient ID: Johnathan LarsenGary W Shepheard, male    DOB: 1961/06/20, 54 y.o.   MRN: [id number]  HPI 54 yo M with neuropathy, HIV/AIDS, DM that is diet controlled, suspected Marfan's syndrome, and COPD. He was started on complera 2012.  Last TTE 01-2013- mildto mod MR otherwise nl. EF nl.  Was in hospital 09-2013 with sustained VT. His cath was normal. He was treated medically.He attributes this to vaping. Has CV f/u.  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  Wants to be seen by dental for new dentures, his no longer fit.  Has appt for podiatry- wound has healed.  Has been walking to gas station to get cigarettes, candy.   HIV 1 RNA QUANT (copies/mL)  Date Value  03/24/2015 <20  10/24/2014 <20  03/03/2014 23*   CD4 T CELL ABS (/uL)  Date Value  03/24/2015 70*  10/24/2014 90*  03/03/2014 100*   Wt down 24# since 04-2014.   Does not check his FSG.   Review of Systems  Constitutional: Negative for appetite change and unexpected weight change.  Cardiovascular: Positive for leg swelling.  Gastrointestinal: Negative for diarrhea and constipation.  Genitourinary: Negative for difficulty urinating.  Psychiatric/Behavioral: Negative for sleep disturbance.  see HPI.      Objective:   Physical Exam  Constitutional: He appears well-developed and well-nourished.  HENT:  Mouth/Throat: No oropharyngeal exudate.  Eyes: EOM are normal. Pupils are equal, round, and reactive to light.  Neck: Neck supple.  Cardiovascular: Normal rate, regular rhythm and normal heart sounds.   Pulmonary/Chest: Effort normal and breath sounds normal.  Abdominal: Soft. Bowel sounds are normal. There is no tenderness. There is no rebound.  Musculoskeletal: He exhibits edema.  Lymphadenopathy:    He has no cervical adenopathy.      Assessment & Plan:

## 2015-04-15 NOTE — Assessment & Plan Note (Signed)
He is doing ok on complera. Will change him to odefsy Consider change to genvoya? Offered/refused condoms.  rtc 3-4 months

## 2015-04-15 NOTE — Assessment & Plan Note (Signed)
This is much better with him wearing his support hose more.  He has podiatry eval Needs pcp eval.

## 2015-04-15 NOTE — Assessment & Plan Note (Signed)
Encouraged to quit. 

## 2015-04-21 NOTE — Telephone Encounter (Signed)
Dr Ladona Ridgelaylor reviewed and says okay to switch to either of the two medications.  I have called the patient and he says he is not taking the Metoprolol and is feeling fine.  He feels all of his symptoms may have been coming from a medication he was taking for HIV.  He will call back if he needs anything else

## 2015-04-28 ENCOUNTER — Other Ambulatory Visit: Payer: Self-pay | Admitting: Infectious Diseases

## 2015-04-28 ENCOUNTER — Telehealth: Payer: Self-pay | Admitting: *Deleted

## 2015-04-28 NOTE — Telephone Encounter (Signed)
Rx request for Lasix; not filled in over six months. Patient states he only takes occasionally, please advise. Wendall MolaJacqueline Cockerham

## 2015-04-29 ENCOUNTER — Other Ambulatory Visit: Payer: Medicare Other

## 2015-04-29 ENCOUNTER — Other Ambulatory Visit: Payer: Self-pay | Admitting: *Deleted

## 2015-04-29 ENCOUNTER — Ambulatory Visit (INDEPENDENT_AMBULATORY_CARE_PROVIDER_SITE_OTHER): Payer: Medicare Other | Admitting: Podiatry

## 2015-04-29 ENCOUNTER — Encounter: Payer: Self-pay | Admitting: Podiatry

## 2015-04-29 VITALS — BP 107/54 | HR 92 | Resp 12

## 2015-04-29 DIAGNOSIS — M79673 Pain in unspecified foot: Secondary | ICD-10-CM

## 2015-04-29 DIAGNOSIS — B351 Tinea unguium: Secondary | ICD-10-CM

## 2015-04-29 DIAGNOSIS — E1142 Type 2 diabetes mellitus with diabetic polyneuropathy: Secondary | ICD-10-CM | POA: Diagnosis not present

## 2015-04-29 NOTE — Progress Notes (Signed)
   Subjective:    Patient ID: Johnathan LarsenGary W Ojala, male    DOB: 03-06-1961, 54 y.o.   MRN: [id number]  HPI  This patient presents today describing recurrent blister  and pain on the the plantar aspect of right and left feet over the past 2 years. Symptoms are slowly progressing over time. He says his symptoms have been treated in the past with topical medications, however continue to recur. At this time he says there are no blistering areas on his feet bilaterally, however some plantar scaling. He attempts to wash and keep his feet clean. He also is requesting nail debridement and relate some history of 90 days of oral medication for the nails and 2015.   Review of Systems  Cardiovascular: Positive for leg swelling.  Skin: Positive for color change.  All other systems reviewed and are negative.      Objective:   Physical Exam  Pleasant orientated 3  Vascular: Bilateral nonpitting refill edema bilaterally DP and PT pulses 2/4 bilaterally Capillary reflex immediate bilaterally  Neurological: Sensation to 10 g monofilament wire intact 0/5 bilaterally Vibratory sensation nonreactive bilaterally Ankle reflexes were equal reactive bilaterally  Dermatological: Manson PasseyBrown hyperpigmentation and varicosities ankles and dorsal feet bilaterally Plantar scaling bilaterally No open skin lesions noted bilaterally The toenails are hypertrophic, discolored, deformed 6-10  Musculoskeletal: Hammertoe fourth bilaterally No restriction ankle, subtalar, midtarsal joints bilaterally      Assessment & Plan:   Assessment: Venous disease bilaterally Significant diabetic peripheral neuropathy Plantar dry scaling skin Cannot rule out recurrent tinea pedis bilaterally  Plan: Today reviewed the results of the examination .Marland Kitchen. At this time I made him where he has significant peripheral neuropathy which is the most likely source of his pain. Also, discussed the recurrent blistering which is not evident today.  At this time I recommended all-purpose skin lotion to the plantar aspect right fifth feet daily until the skin appears normal. If patient notices blistering blistering on the plantar aspect the right and left feet at that time I recommended the application of the over-the-counter clitrimazole 1% cream applied twice a day 30 days   Toenails 10 are debrided sharply without any bleeding  Reappoint when necessary at patient's request or yearly

## 2015-04-29 NOTE — Telephone Encounter (Signed)
Send to Dr Ninetta LightsHatcher

## 2015-04-29 NOTE — Telephone Encounter (Signed)
Pharmacy notified.

## 2015-04-29 NOTE — Patient Instructions (Signed)
Today your diabetic foot examination revealed good arterial pulsations anterior feet Venous disease Significant peripheral neuropathy Dry skin on the bottom right and left feet Apply all-purpose skin moisturizing to the skin on the bottom of right and left feet and to dry skin appears normal If you develop blistering purchase over-the-counter clotrimazole 1% cream and rub into your skin blistered areas twice a day 30 days Also today I trim back your fungal toenails Return at your request or yearly  Diabetes and Foot Care Diabetes may cause you to have problems because of poor blood supply (circulation) to your feet and legs. This may cause the skin on your feet to become thinner, break easier, and heal more slowly. Your skin may become dry, and the skin may peel and crack. You may also have nerve damage in your legs and feet causing decreased feeling in them. You may not notice minor injuries to your feet that could lead to infections or more serious problems. Taking care of your feet is one of the most important things you can do for yourself.  HOME CARE INSTRUCTIONS  Wear shoes at all times, even in the house. Do not go barefoot. Bare feet are easily injured.  Check your feet daily for blisters, cuts, and redness. If you cannot see the bottom of your feet, use a mirror or ask someone for help.  Wash your feet with warm water (do not use hot water) and mild soap. Then pat your feet and the areas between your toes until they are completely dry. Do not soak your feet as this can dry your skin.  Apply a moisturizing lotion or petroleum jelly (that does not contain alcohol and is unscented) to the skin on your feet and to dry, brittle toenails. Do not apply lotion between your toes.  Trim your toenails straight across. Do not dig under them or around the cuticle. File the edges of your nails with an emery board or nail file.  Do not cut corns or calluses or try to remove them with  medicine.  Wear clean socks or stockings every day. Make sure they are not too tight. Do not wear knee-high stockings since they may decrease blood flow to your legs.  Wear shoes that fit properly and have enough cushioning. To break in new shoes, wear them for just a few hours a day. This prevents you from injuring your feet. Always look in your shoes before you put them on to be sure there are no objects inside.  Do not cross your legs. This may decrease the blood flow to your feet.  If you find a minor scrape, cut, or break in the skin on your feet, keep it and the skin around it clean and dry. These areas may be cleansed with mild soap and water. Do not cleanse the area with peroxide, alcohol, or iodine.  When you remove an adhesive bandage, be sure not to damage the skin around it.  If you have a wound, look at it several times a day to make sure it is healing.  Do not use heating pads or hot water bottles. They may burn your skin. If you have lost feeling in your feet or legs, you may not know it is happening until it is too late.  Make sure your health care provider performs a complete foot exam at least annually or more often if you have foot problems. Report any cuts, sores, or bruises to your health care provider immediately. SEEK MEDICAL  CARE IF:   You have an injury that is not healing.  You have cuts or breaks in the skin.  You have an ingrown nail.  You notice redness on your legs or feet.  You feel burning or tingling in your legs or feet.  You have pain or cramps in your legs and feet.  Your legs or feet are numb.  Your feet always feel cold. SEEK IMMEDIATE MEDICAL CARE IF:   There is increasing redness, swelling, or pain in or around a wound.  There is a red line that goes up your leg.  Pus is coming from a wound.  You develop a fever or as directed by your health care provider.  You notice a bad smell coming from an ulcer or wound.   This information is  not intended to replace advice given to you by your health care provider. Make sure you discuss any questions you have with your health care provider.   Document Released: 06/17/2000 Document Revised: 02/20/2013 Document Reviewed: 11/27/2012 Elsevier Interactive Patient Education [employer]

## 2015-04-29 NOTE — Telephone Encounter (Signed)
Needs to get from his PCP or CV

## 2015-04-30 ENCOUNTER — Encounter: Payer: Self-pay | Admitting: Podiatry

## 2015-05-05 ENCOUNTER — Telehealth: Payer: Self-pay | Admitting: *Deleted

## 2015-05-05 DIAGNOSIS — G629 Polyneuropathy, unspecified: Secondary | ICD-10-CM

## 2015-05-05 MED ORDER — MORPHINE SULFATE ER 40 MG PO CP24: 40.0000 mg | ORAL_CAPSULE | Freq: Two times a day (BID) | ORAL | Status: DC

## 2015-05-05 NOTE — Telephone Encounter (Signed)
Pt had question re:  [id number] / lasix issue.  The pt had read about there being an [facility].  Wanted to find out from Dr. Ninetta LightsHatcher about this issue.  Pt is also attempting to obtain a PCP for himself.

## 2015-05-06 NOTE — Telephone Encounter (Signed)
Lasix, genvoya- ok

## 2015-05-12 NOTE — Telephone Encounter (Signed)
Shared Dr. Moshe CiproHatcher's note with the pt.  The pt is going to try to obtain an appointment with Dr. Oliver BarreJames John who is showing as [facility] P[facility].

## 2015-05-13 ENCOUNTER — Ambulatory Visit: Payer: Medicare Other | Admitting: Infectious Diseases

## 2015-05-14 ENCOUNTER — Other Ambulatory Visit: Payer: Self-pay | Admitting: *Deleted

## 2015-05-14 ENCOUNTER — Telehealth: Payer: Self-pay | Admitting: *Deleted

## 2015-05-14 MED ORDER — FUROSEMIDE 40 MG PO TABS: 20.0000 mg | ORAL_TABLET | Freq: Every morning | ORAL | Status: DC

## 2015-05-14 NOTE — Telephone Encounter (Signed)
Patient called requesting a refill on Lasix until he could get in with PCP. Rx sent per Dr. Ninetta LightsHatcher and phone number provided for Dr. Oliver BarreJames John.

## 2015-05-18 ENCOUNTER — Telehealth: Payer: Self-pay | Admitting: General Practice

## 2015-05-18 NOTE — Telephone Encounter (Signed)
Patient states he use to be a patient of Dr. Jonny RuizJohn.  He is requesting to re establish.  Please advise.

## 2015-05-19 NOTE — Telephone Encounter (Signed)
Ok with me 

## 2015-05-20 NOTE — Telephone Encounter (Signed)
Left patient vm to call back to schedule appt  °

## 2015-06-02 ENCOUNTER — Other Ambulatory Visit: Payer: Self-pay | Admitting: *Deleted

## 2015-06-02 DIAGNOSIS — G629 Polyneuropathy, unspecified: Secondary | ICD-10-CM

## 2015-06-02 MED ORDER — MORPHINE SULFATE ER 40 MG PO CP24: 40.0000 mg | ORAL_CAPSULE | Freq: Two times a day (BID) | ORAL | Status: DC

## 2015-06-04 ENCOUNTER — Other Ambulatory Visit: Payer: Self-pay | Admitting: Infectious Diseases

## 2015-06-04 DIAGNOSIS — I10 Essential (primary) hypertension: Secondary | ICD-10-CM

## 2015-06-05 ENCOUNTER — Other Ambulatory Visit: Payer: Self-pay | Admitting: Infectious Diseases

## 2015-06-05 DIAGNOSIS — R110 Nausea: Secondary | ICD-10-CM

## 2015-06-09 ENCOUNTER — Ambulatory Visit: Payer: Medicare Other | Admitting: Internal Medicine

## 2015-06-09 ENCOUNTER — Other Ambulatory Visit: Payer: Self-pay | Admitting: *Deleted

## 2015-06-09 ENCOUNTER — Encounter: Payer: Self-pay | Admitting: Infectious Disease

## 2015-06-09 ENCOUNTER — Ambulatory Visit
Admission: RE | Admit: 2015-06-09 | Discharge: 2015-06-09 | Disposition: A | Discharge status: Home / Self Care | Payer: Medicare Other | Source: Ambulatory Visit | Attending: Infectious Disease | Admitting: Infectious Disease

## 2015-06-09 ENCOUNTER — Telehealth: Payer: Self-pay | Admitting: *Deleted

## 2015-06-09 ENCOUNTER — Ambulatory Visit (INDEPENDENT_AMBULATORY_CARE_PROVIDER_SITE_OTHER): Payer: Medicare Other | Admitting: Infectious Disease

## 2015-06-09 VITALS — BP 104/73 | HR 97 | Temp 98.7°F | Ht 75.0 in | Wt 214.0 lb

## 2015-06-09 DIAGNOSIS — R059 Cough, unspecified: Secondary | ICD-10-CM

## 2015-06-09 DIAGNOSIS — B20 Human immunodeficiency virus [HIV] disease: Secondary | ICD-10-CM

## 2015-06-09 DIAGNOSIS — W19XXXA Unspecified fall, initial encounter: Secondary | ICD-10-CM

## 2015-06-09 DIAGNOSIS — K2100 Gastro-esophageal reflux disease with esophagitis, without bleeding: Secondary | ICD-10-CM

## 2015-06-09 DIAGNOSIS — K7460 Unspecified cirrhosis of liver: Secondary | ICD-10-CM

## 2015-06-09 DIAGNOSIS — B181 Chronic viral hepatitis B without delta-agent: Secondary | ICD-10-CM

## 2015-06-09 DIAGNOSIS — K219 Gastro-esophageal reflux disease without esophagitis: Secondary | ICD-10-CM | POA: Insufficient documentation

## 2015-06-09 DIAGNOSIS — R05 Cough: Secondary | ICD-10-CM

## 2015-06-09 DIAGNOSIS — Z0289 Encounter for other administrative examinations: Secondary | ICD-10-CM

## 2015-06-09 DIAGNOSIS — J329 Chronic sinusitis, unspecified: Secondary | ICD-10-CM

## 2015-06-09 HISTORY — DX: Gastro-esophageal reflux disease without esophagitis: K21.9

## 2015-06-09 HISTORY — DX: Chronic viral hepatitis B without delta-agent: B18.1

## 2015-06-09 LAB — CBC WITH DIFFERENTIAL/PLATELET
Basophils Absolute: 0 10*3/uL (ref 0.0–0.1)
Basophils Relative: 0 % (ref 0–1)
EOS PCT: 0 % (ref 0–5)
Eosinophils Absolute: 0 10*3/uL (ref 0.0–0.7)
HEMATOCRIT: 37.2 % — AB (ref 39.0–52.0)
HEMOGLOBIN: 12.4 g/dL — AB (ref 13.0–17.0)
LYMPHS ABS: 1.1 10*3/uL (ref 0.7–4.0)
LYMPHS PCT: 14 % (ref 12–46)
MCH: 27.6 pg (ref 26.0–34.0)
MCHC: 33.3 g/dL (ref 30.0–36.0)
MCV: 82.9 fL (ref 78.0–100.0)
MONO ABS: 0.4 10*3/uL (ref 0.1–1.0)
MONOS PCT: 5 % (ref 3–12)
MPV: 9.5 fL (ref 8.6–12.4)
NEUTROS ABS: 6.1 10*3/uL (ref 1.7–7.7)
Neutrophils Relative %: 81 % — ABNORMAL HIGH (ref 43–77)
Platelets: 220 10*3/uL (ref 150–400)
RBC: 4.49 MIL/uL (ref 4.22–5.81)
RDW: 15 % (ref 11.5–15.5)
WBC: 7.5 10*3/uL (ref 4.0–10.5)

## 2015-06-09 LAB — COMPLETE METABOLIC PANEL WITH GFR
ALT: 9 U/L (ref 9–46)
AST: 14 U/L (ref 10–35)
Albumin: 4 g/dL (ref 3.6–5.1)
Alkaline Phosphatase: 119 U/L — ABNORMAL HIGH (ref 40–115)
BUN: 15 mg/dL (ref 7–25)
CALCIUM: 9.5 mg/dL (ref 8.6–10.3)
CHLORIDE: 100 mmol/L (ref 98–110)
CO2: 28 mmol/L (ref 20–31)
CREATININE: 0.85 mg/dL (ref 0.70–1.33)
GFR, Est African American: >89 mL/min (ref >=60)
GFR, Est Non African American: >89 mL/min (ref >=60)
GLUCOSE: 84 mg/dL (ref 65–99)
POTASSIUM: 4.9 mmol/L (ref 3.5–5.3)
SODIUM: 135 mmol/L (ref 135–146)
Total Bilirubin: 0.5 mg/dL (ref 0.2–1.2)
Total Protein: 6.9 g/dL (ref 6.1–8.1)

## 2015-06-09 MED ORDER — EMTRICITABINE-TENOFOVIR AF 200-25 MG PO TABS: 1.0000 | ORAL_TABLET | Freq: Every day | ORAL | Status: DC

## 2015-06-09 MED ORDER — DOLUTEGRAVIR SODIUM 50 MG PO TABS: 50.0000 mg | ORAL_TABLET | Freq: Every day | ORAL | Status: DC

## 2015-06-09 NOTE — Patient Instructions (Signed)
We will get blood work today  We will get a Chest Xray  I would recommend you use sudafed for your congestion and avoid any further cough medicines because these medicines along with your medicines for pain, anxiety but you at risk for being overly sedated  I would try to stop smoking  Use humidifier  I am chaning your HIV regimen to:  TIVICAY (yellow pill)  And  Descovy (blue pill)  These TWO medicines should be taken every day and can be taken with or without food  YOU SHOULD NOT TAKE iron supplements, magnesium or TUMS at the SAME time as these medicines  YOU CAN TAKE ANTACIDS including OMEPRAZOLE

## 2015-06-09 NOTE — Progress Notes (Signed)
Chief complaint: cough, subjective fevers Subjective:    Patient ID: [name], male    DOB: [birth year], 54 y.o.   MRN: [id number]  HPI  54 year old man with HIV/AIDS nicely suppressed virus but chronically low CD4 count presents with worsening cough and low grade subjective fevers. His cough was so severe that he took otc nyquil and then became delirious and fell. Of not he is on multiple sedating meds including his chroniic long acting opiate and his two different benzodiazepenes and phenergan prn.  He is still unfortunately also smoking.  I reviewed his med list and noticed that he had omeprazole as an active drug despite fact that he absolutely should NOT be on this a the same time as he is on a RPV containing regimen such as ODEFSEY.   His last VL was still <20 despite his taking the PPI "from time to time" but I am worried that when he takes for sufficient doses in a row he will develop subtherapeutic RPV levels and fail with R to RPV and then the rest of his "backbone."  Past Medical History  Diagnosis Date  . Chronic hepatitis B (HCC)   . HIV (human immunodeficiency virus infection) (HCC)   . Cholesteatoma   . Depression   . Marfan syndrome     Dr Ninetta Lights follows  . Venous insufficiency (chronic) (peripheral)   . Mitral valve prolapse   . VT (ventricular tachycardia) (HCC)   . COPD (chronic obstructive pulmonary disease) (HCC)   . COPD exacerbation (HCC) 12/08/2014  . Folliculitis barbae 12/08/2014  . Rhinosinusitis 12/08/2014  . ILD (interstitial lung disease) (HCC) 12/08/2014    Past Surgical History  Procedure Laterality Date  . Cholesteotoma removal    . Appendectomy    . Repair of perforated colon    . Left heart catheterization with coronary angiogram [name] 09/24/2013    Procedure: LEFT HEART CATHETERIZATION WITH CORONARY ANGIOGRAM;  Surgeon: Marykay Lex, MD;  Location: [facility] CATH LAB;  Service: Cardiovascular;  Laterality: [name];    Family History  Problem Relation Age  of Onset  . Cancer Mother   . Hypertension Mother   . Stroke Sister   . Hypertension Sister   . Aneurysm Sister       Social History   Social History  . Marital Status: Single    Spouse Name: [name]  . Number of Children: [name]  . Years of Education: [name]   Social History Main Topics  . Smoking status: Current Some Day Smoker -- 0.25 packs/day for 35 years    Types: Cigarettes  . Smokeless tobacco: Never Used     Comment: trying to cut back  . Alcohol Use: No  . Drug Use: No  . Sexual Activity: No     Comment: pt. declined condoms   Other Topics Concern  . None   Social History Narrative    Allergies  Allergen Reactions  . Tetracycline Hcl Swelling    Lips, facial swelling     Current outpatient prescriptions:  .  ALPRAZolam (XANAX) 0.5 MG tablet, Take 0.5 mg by mouth 3 (three) times daily. , Disp: , Rfl:  .  clonazePAM (KLONOPIN) 1 MG tablet, Take 1 mg by mouth at bedtime., Disp: , Rfl:  .  desonide (DESOWEN) 0.05 % ointment, as directed. , Disp: , Rfl:  .  dolutegravir (TIVICAY) 50 MG tablet, Take 1 tablet (50 mg total) by mouth daily., Disp: 30 tablet, Rfl: 11 .  emtricitabine-tenofovir AF (DESCOVY)  200-25 MG tablet, Take 1 tablet by mouth daily., Disp: 30 tablet, Rfl: 11 .  furosemide (LASIX) 40 MG tablet, Take 0.5 tablets (20 mg total) by mouth every morning., Disp: 30 tablet, Rfl: 0 .  meclizine (ANTIVERT) 25 MG tablet, Take 1 tablet by mouth daily as needed. DIzziness, Disp: , Rfl: 1 .  Morphine Sulfate 40 MG CP24, Take 40 mg by mouth 2 (two) times daily., Disp: 60 capsule, Rfl: 0 .  nortriptyline (PAMELOR) 75 MG capsule, Take 150 mg by mouth at bedtime., Disp: , Rfl:  .  omeprazole (PRILOSEC) 40 MG capsule, Take 40 mg by mouth daily as needed (heartburn)., Disp: , Rfl:  .  promethazine (PHENERGAN) 25 MG tablet, TAKE 1 TABLET BY MOUTH EVERY 8 HOURS AS NEEDED., Disp: 30 tablet, Rfl: 3 .  spironolactone (ALDACTONE) 25 MG tablet, TAKE 1 TABLET BY MOUTH ONCE DAILY.,  Disp: 30 tablet, Rfl: 1 .  sulfamethoxazole-trimethoprim (BACTRIM DS,SEPTRA DS) 800-160 MG per tablet, Take 1 tablet by mouth 3 times a week as needed for infection, Disp: , Rfl:    Review of Systems  Constitutional: Positive for chills and fatigue. Negative for fever, diaphoresis, activity change, appetite change and unexpected weight change.  HENT: Positive for congestion. Negative for rhinorrhea, sinus pressure, sneezing, sore throat and trouble swallowing.   Eyes: Negative for photophobia and visual disturbance.  Respiratory: Positive for cough and shortness of breath. Negative for chest tightness, wheezing and stridor.   Cardiovascular: Negative for chest pain, palpitations and leg swelling.  Gastrointestinal: Negative for nausea, vomiting, abdominal pain, diarrhea, constipation, blood in stool, abdominal distention and anal bleeding.  Genitourinary: Negative for dysuria, hematuria, flank pain and difficulty urinating.  Musculoskeletal: Negative for myalgias, back pain, joint swelling, arthralgias and gait problem.  Skin: Negative for color change, pallor, rash and wound.  Neurological: Positive for dizziness, weakness and light-headedness. Negative for tremors.  Hematological: Negative for adenopathy. Does not bruise/bleed easily.  Psychiatric/Behavioral: Negative for behavioral problems, confusion, sleep disturbance, dysphoric mood, decreased concentration and agitation.       Objective:   Physical Exam  Constitutional: He is oriented to person, place, and time. He appears well-nourished.  HENT:  Head: Normocephalic and atraumatic.  Mouth/Throat: Oropharynx is clear and moist. No oropharyngeal exudate.  Eyes: Conjunctivae and EOM are normal. Right eye exhibits no discharge. Left eye exhibits no discharge. No scleral icterus.  Neck: Normal range of motion. Neck supple. No JVD present.  Cardiovascular: Normal rate, regular rhythm and normal heart sounds.  Exam reveals no gallop and  no friction rub.   No murmur heard. Pulmonary/Chest: Effort normal and breath sounds normal. No respiratory distress. He has no wheezes. He has no rales. He exhibits no tenderness.  Abdominal: Soft. Bowel sounds are normal. He exhibits no distension. There is no tenderness.  Musculoskeletal: Normal range of motion. He exhibits no edema or tenderness.  Neurological: He is alert and oriented to person, place, and time.  Skin: Skin is warm and dry. No rash noted. No erythema. No pallor.  Psychiatric: His speech is normal and behavior is normal. Judgment and thought content normal. His mood appears anxious. Cognition and memory are normal.          Assessment & Plan:   HIV: check HIV ultraquant with reflex to genotype. Change him to Tivicay and Descovy in the interim. This regimen he will be able to take without risking virological failiure due to acid suppression. He can take H2 blockers PPI with this regimen. He  will need to AVOID taking mag or calcium or iron containing tablets at the same time as his ARVS. He can take his ARVs with or without food  Hopefully he will not have R (has managed to not develop it in the past despite PPI therapy)  I will bring him back in one months time to recheck labs on new regimen   Cough, viral URI: I checked CXR and it did not show any infiltrates. I advised him to use decongestants to rx his sinus congestion and NOT add new cough suppresants to his multiple sedating meds  I have asked him to stop smoking  GERD: we are moving him off RPV regimen so he can takt H2 blockers PPI without risking virological failure  Fall: due to polypharmacy. Advised not to add new sedating meds to his current narcotics ,benzos anti-emetics  I spent greater than 40 minutes with the patient including greater than 50% of time in face to face counsel of the patient re his HIV and his new regimen, his cough, his viral URI ssx his cough and his fall  and in coordination of their  care.

## 2015-06-09 NOTE — Telephone Encounter (Signed)
cold symptoms, swollen eyes, has tried OTC meds.  Requesting work-in appt.  Has transportation for this PM.

## 2015-06-10 ENCOUNTER — Telehealth: Payer: Self-pay | Admitting: *Deleted

## 2015-06-10 NOTE — Telephone Encounter (Signed)
Thank you hopefully he has his new regimen Tivicay and Descovy now [facility] with [facility]

## 2015-06-10 NOTE — Telephone Encounter (Signed)
Patient calling for result of his chest xray from 12/6.  Please advise. Andree CossHowell, Lillyana Majette M, RN

## 2015-06-10 NOTE — Telephone Encounter (Signed)
Very good 

## 2015-06-10 NOTE — Telephone Encounter (Signed)
His CXR is fine

## 2015-06-10 NOTE — Telephone Encounter (Signed)
Relayed results. Thank you.

## 2015-06-10 NOTE — Telephone Encounter (Signed)
He is picking it up today, will start tonight.

## 2015-06-11 LAB — T-HELPER CELL (CD4) - (RCID CLINIC ONLY)
CD4 % Helper T Cell: 8 % — ABNORMAL LOW (ref 33–55)
CD4 T Cell Abs: 90 /uL — ABNORMAL LOW (ref 400–2700)

## 2015-06-11 LAB — HIV-1 RNA ULTRAQUANT REFLEX TO GENTYP+
HIV 1 RNA Quant: <20 copies/mL (ref <20)
HIV-1 RNA Quant, Log: <1.3 Log copies/mL (ref <1.30)

## 2015-06-24 ENCOUNTER — Other Ambulatory Visit: Payer: Self-pay | Admitting: Licensed Clinical Social Worker

## 2015-06-24 DIAGNOSIS — G629 Polyneuropathy, unspecified: Secondary | ICD-10-CM

## 2015-06-24 MED ORDER — MORPHINE SULFATE ER 40 MG PO CP24: 40.0000 mg | ORAL_CAPSULE | Freq: Two times a day (BID) | ORAL | Status: DC

## 2015-06-26 ENCOUNTER — Ambulatory Visit: Payer: Medicare Other | Admitting: Internal Medicine

## 2015-06-26 NOTE — Telephone Encounter (Signed)
Patient has no showed twice this month. He is asking for us to reschedule him. Do you want him to reschedule?

## 2015-06-26 NOTE — Telephone Encounter (Signed)
Please to not re-schedule, thanks

## 2015-07-09 ENCOUNTER — Other Ambulatory Visit: Payer: Self-pay | Admitting: Infectious Diseases

## 2015-07-09 DIAGNOSIS — I10 Essential (primary) hypertension: Secondary | ICD-10-CM

## 2015-07-14 ENCOUNTER — Ambulatory Visit: Payer: Medicare Other | Admitting: Infectious Diseases

## 2015-07-27 ENCOUNTER — Other Ambulatory Visit: Payer: Self-pay | Admitting: *Deleted

## 2015-07-27 DIAGNOSIS — G629 Polyneuropathy, unspecified: Secondary | ICD-10-CM

## 2015-07-27 MED ORDER — MORPHINE SULFATE ER 40 MG PO CP24: 40.0000 mg | ORAL_CAPSULE | Freq: Two times a day (BID) | ORAL | Status: DC

## 2015-08-03 ENCOUNTER — Other Ambulatory Visit: Payer: Medicare Other

## 2015-08-03 DIAGNOSIS — B20 Human immunodeficiency virus [HIV] disease: Secondary | ICD-10-CM | POA: Diagnosis not present

## 2015-08-03 DIAGNOSIS — R05 Cough: Secondary | ICD-10-CM | POA: Diagnosis not present

## 2015-08-03 LAB — COMPLETE METABOLIC PANEL WITH GFR
ALT: 10 U/L (ref 9–46)
AST: 11 U/L (ref 10–35)
Albumin: 4 g/dL (ref 3.6–5.1)
Alkaline Phosphatase: 102 U/L (ref 40–115)
BILIRUBIN TOTAL: 0.5 mg/dL (ref 0.2–1.2)
BUN: 12 mg/dL (ref 7–25)
CALCIUM: 9.6 mg/dL (ref 8.6–10.3)
CHLORIDE: 102 mmol/L (ref 98–110)
CO2: 27 mmol/L (ref 20–31)
CREATININE: 1 mg/dL (ref 0.70–1.33)
GFR, EST NON AFRICAN AMERICAN: 84 mL/min (ref >=60)
GLUCOSE: 96 mg/dL (ref 65–99)
POTASSIUM: 4.4 mmol/L (ref 3.5–5.3)
Sodium: 136 mmol/L (ref 135–146)
Total Protein: 7.1 g/dL (ref 6.1–8.1)

## 2015-08-03 LAB — CBC WITH DIFFERENTIAL/PLATELET
BASOS PCT: 0 % (ref 0–1)
Basophils Absolute: 0 10*3/uL (ref 0.0–0.1)
Eosinophils Absolute: 0.1 10*3/uL (ref 0.0–0.7)
Eosinophils Relative: 1 % (ref 0–5)
HEMATOCRIT: 41 % (ref 39.0–52.0)
Hemoglobin: 14 g/dL (ref 13.0–17.0)
LYMPHS ABS: 0.9 10*3/uL (ref 0.7–4.0)
LYMPHS PCT: 14 % (ref 12–46)
MCH: 28.1 pg (ref 26.0–34.0)
MCHC: 34.1 g/dL (ref 30.0–36.0)
MCV: 82.2 fL (ref 78.0–100.0)
MONO ABS: 0.3 10*3/uL (ref 0.1–1.0)
MONOS PCT: 5 % (ref 3–12)
MPV: 9.2 fL (ref 8.6–12.4)
NEUTROS ABS: 5 10*3/uL (ref 1.7–7.7)
Neutrophils Relative %: 80 % — ABNORMAL HIGH (ref 43–77)
Platelets: 198 10*3/uL (ref 150–400)
RBC: 4.99 MIL/uL (ref 4.22–5.81)
RDW: 15.9 % — AB (ref 11.5–15.5)
WBC: 6.3 10*3/uL (ref 4.0–10.5)

## 2015-08-03 NOTE — Addendum Note (Signed)
Addended byJimmy Picket F on: 08/03/2015 12:13 PM   Modules accepted: Orders

## 2015-08-04 LAB — HIV-1 RNA QUANT-NO REFLEX-BLD

## 2015-08-04 LAB — T-HELPER CELL (CD4) - (RCID CLINIC ONLY)
CD4 % Helper T Cell: 8 % — ABNORMAL LOW (ref 33–55)
CD4 T CELL ABS: 70 /uL — AB (ref 400–2700)

## 2015-08-04 LAB — RPR

## 2015-08-17 ENCOUNTER — Encounter: Payer: Self-pay | Admitting: Infectious Diseases

## 2015-08-17 ENCOUNTER — Ambulatory Visit (INDEPENDENT_AMBULATORY_CARE_PROVIDER_SITE_OTHER): Payer: Medicare Other | Admitting: Infectious Diseases

## 2015-08-17 ENCOUNTER — Telehealth: Payer: Self-pay | Admitting: *Deleted

## 2015-08-17 VITALS — BP 125/80 | HR 91 | Temp 97.6°F | Ht 75.0 in | Wt 208.0 lb

## 2015-08-17 DIAGNOSIS — E11621 Type 2 diabetes mellitus with foot ulcer: Secondary | ICD-10-CM | POA: Diagnosis not present

## 2015-08-17 DIAGNOSIS — F329 Major depressive disorder, single episode, unspecified: Secondary | ICD-10-CM

## 2015-08-17 DIAGNOSIS — Z79899 Other long term (current) drug therapy: Secondary | ICD-10-CM

## 2015-08-17 DIAGNOSIS — Q8740 Marfan's syndrome, unspecified: Secondary | ICD-10-CM

## 2015-08-17 DIAGNOSIS — Z113 Encounter for screening for infections with a predominantly sexual mode of transmission: Secondary | ICD-10-CM

## 2015-08-17 DIAGNOSIS — F32A Depression, unspecified: Secondary | ICD-10-CM

## 2015-08-17 DIAGNOSIS — F17200 Nicotine dependence, unspecified, uncomplicated: Secondary | ICD-10-CM

## 2015-08-17 DIAGNOSIS — B20 Human immunodeficiency virus [HIV] disease: Secondary | ICD-10-CM

## 2015-08-17 DIAGNOSIS — E1142 Type 2 diabetes mellitus with diabetic polyneuropathy: Secondary | ICD-10-CM

## 2015-08-17 DIAGNOSIS — L97529 Non-pressure chronic ulcer of other part of left foot with unspecified severity: Secondary | ICD-10-CM

## 2015-08-17 NOTE — Progress Notes (Signed)
   Subjective:    Patient ID: [name], male    DOB: 04-Dec-1960, 55 y.o.   MRN: [id number]  HPI 55 yo M with neuropathy, HIV/AIDS, DM that is diet controlled, suspected Marfan's syndrome, and COPD. He was started on complera 2012.  Last TTE 01-2013- mildto mod MR otherwise nl. EF nl.  Was in hospital 09-2013 with sustained VT. His cath was normal. He was treated medically.He attributes this to vaping. Has CV f/u.  He was seen for cough by Dr Daiva Eves in 06-2015 and was changed to DTGV/Descovy due to concerns about PPI use.   HIV 1 RNA QUANT (copies/mL)  Date Value  08/03/2015 <20  06/09/2015 <20  03/24/2015 <20   CD4 T CELL ABS (/uL)  Date Value  08/03/2015 70*  06/09/2015 90*  03/24/2015 70*   Has been feeling better since ART change- no further dizziness, no further falls.  Still occas cough.  LE edema is resolved per pt Sleeps in recliner.  1ppd.  Family is doing well  Review of Systems  Constitutional: Negative for appetite change and unexpected weight change.  Respiratory: Positive for cough. Negative for shortness of breath.   Cardiovascular: Negative for leg swelling.  Gastrointestinal: Negative for diarrhea and constipation.  Genitourinary: Negative for difficulty urinating.  Neurological: Negative for dizziness, light-headedness and headaches.  has developed numbness in his L 4/5 digits. 8-10 months. No neck pain.      Objective:   Physical Exam  Constitutional: He appears well-developed and well-nourished.  HENT:  Mouth/Throat: No oropharyngeal exudate.  Eyes: EOM are normal. Pupils are equal, round, and reactive to light.  Neck: Neck supple.  Cardiovascular: Normal rate, regular rhythm and normal heart sounds.   Pulmonary/Chest: Effort normal and breath sounds normal.  Abdominal: Soft. Bowel sounds are normal. There is no tenderness. There is no rebound.  Musculoskeletal: He exhibits edema.  Lymphadenopathy:    He has no cervical adenopathy.           Assessment & Plan:

## 2015-08-17 NOTE — Assessment & Plan Note (Signed)
He is doing very well Will see him back in 6 months with labs Offered/refused condoms.  Has gotten flu shot.

## 2015-08-17 NOTE — Assessment & Plan Note (Signed)
Last 2 Glc normal.  His ulcer is healed (examined).

## 2015-08-17 NOTE — Telephone Encounter (Signed)
Called and notified patient of appt for his echo at [facility] on 08/30/14 at 9:45 AM. Gave # [phone] and advised him it needs to be authorized through his insurance. If it is not approved I will notify him in advance. [facility] [employer] is working on the authorization. Wendall Mola

## 2015-08-17 NOTE — Assessment & Plan Note (Signed)
Will recheck his TTE

## 2015-08-17 NOTE — Assessment & Plan Note (Signed)
Doing well Not clear if L hand numbness/pruritis is from DM, adr.  I offered to have him seen by Neuro which he defers.  Will try hand lotions for dry skin for itching.

## 2015-08-17 NOTE — Assessment & Plan Note (Signed)
Encouraged to quit. 

## 2015-08-17 NOTE — Assessment & Plan Note (Signed)
Saw his psychiatrist last week, offered to have [name] see him which he defers.

## 2015-08-20 ENCOUNTER — Telehealth: Payer: Self-pay | Admitting: *Deleted

## 2015-08-20 NOTE — Telephone Encounter (Signed)
Patient asking if it is ok to pick up refill of his pain meds 1/20 instead of 1/23.  Andree Coss, RN

## 2015-08-21 ENCOUNTER — Other Ambulatory Visit: Payer: Self-pay | Admitting: *Deleted

## 2015-08-21 DIAGNOSIS — G629 Polyneuropathy, unspecified: Secondary | ICD-10-CM

## 2015-08-21 MED ORDER — MORPHINE SULFATE ER 40 MG PO CP24: 40.0000 mg | ORAL_CAPSULE | Freq: Two times a day (BID) | ORAL | Status: DC

## 2015-08-21 NOTE — Telephone Encounter (Signed)
ok 

## 2015-08-31 ENCOUNTER — Ambulatory Visit (HOSPITAL_COMMUNITY)
Admission: RE | Admit: 2015-08-31 | Discharge: 2015-08-31 | Disposition: A | Discharge status: Home / Self Care | Payer: Medicare Other | Source: Ambulatory Visit | Attending: Infectious Diseases | Admitting: Infectious Diseases

## 2015-08-31 DIAGNOSIS — B20 Human immunodeficiency virus [HIV] disease: Secondary | ICD-10-CM | POA: Insufficient documentation

## 2015-08-31 DIAGNOSIS — E119 Type 2 diabetes mellitus without complications: Secondary | ICD-10-CM | POA: Diagnosis not present

## 2015-08-31 DIAGNOSIS — I517 Cardiomegaly: Secondary | ICD-10-CM | POA: Diagnosis not present

## 2015-08-31 DIAGNOSIS — I429 Cardiomyopathy, unspecified: Secondary | ICD-10-CM | POA: Insufficient documentation

## 2015-08-31 DIAGNOSIS — Z720 Tobacco use: Secondary | ICD-10-CM | POA: Insufficient documentation

## 2015-08-31 DIAGNOSIS — I340 Nonrheumatic mitral (valve) insufficiency: Secondary | ICD-10-CM | POA: Diagnosis not present

## 2015-08-31 DIAGNOSIS — Q8740 Marfan's syndrome, unspecified: Secondary | ICD-10-CM | POA: Diagnosis not present

## 2015-08-31 DIAGNOSIS — Z79899 Other long term (current) drug therapy: Secondary | ICD-10-CM | POA: Diagnosis not present

## 2015-08-31 NOTE — Progress Notes (Signed)
*  PRELIMINARY RESULTS* Echocardiogram 2D Echocardiogram has been performed.  [name] 08/31/2015, 10:51 AM

## 2015-09-04 ENCOUNTER — Other Ambulatory Visit: Payer: Self-pay | Admitting: Infectious Diseases

## 2015-09-18 ENCOUNTER — Telehealth: Payer: Self-pay

## 2015-09-18 DIAGNOSIS — G629 Polyneuropathy, unspecified: Secondary | ICD-10-CM

## 2015-09-18 MED ORDER — MORPHINE SULFATE ER 40 MG PO CP24: 40.0000 mg | ORAL_CAPSULE | Freq: Two times a day (BID) | ORAL | Status: DC

## 2015-09-18 NOTE — Telephone Encounter (Signed)
Medication request.  Script printed.  Laurell Josephsammy K Rosmery Duggin, RN

## 2015-10-03 ENCOUNTER — Encounter: Payer: Self-pay | Admitting: Infectious Diseases

## 2015-10-19 ENCOUNTER — Other Ambulatory Visit: Payer: Self-pay | Admitting: *Deleted

## 2015-10-19 DIAGNOSIS — G629 Polyneuropathy, unspecified: Secondary | ICD-10-CM

## 2015-10-19 MED ORDER — MORPHINE SULFATE ER 40 MG PO CP24: 40.0000 mg | ORAL_CAPSULE | Freq: Two times a day (BID) | ORAL | Status: DC

## 2015-11-16 ENCOUNTER — Other Ambulatory Visit: Payer: Self-pay | Admitting: *Deleted

## 2015-11-16 DIAGNOSIS — G629 Polyneuropathy, unspecified: Secondary | ICD-10-CM

## 2015-11-16 MED ORDER — MORPHINE SULFATE ER 40 MG PO CP24: 40.0000 mg | ORAL_CAPSULE | Freq: Two times a day (BID) | ORAL | Status: DC

## 2015-11-18 ENCOUNTER — Other Ambulatory Visit: Payer: Self-pay | Admitting: Infectious Diseases

## 2015-11-18 DIAGNOSIS — B20 Human immunodeficiency virus [HIV] disease: Secondary | ICD-10-CM

## 2015-12-15 ENCOUNTER — Telehealth: Payer: Self-pay | Admitting: *Deleted

## 2015-12-15 DIAGNOSIS — G629 Polyneuropathy, unspecified: Secondary | ICD-10-CM

## 2015-12-15 MED ORDER — MORPHINE SULFATE ER 40 MG PO CP24: 40.0000 mg | ORAL_CAPSULE | Freq: Two times a day (BID) | ORAL | Status: DC

## 2015-12-15 NOTE — Telephone Encounter (Signed)
Please call patient when rx is ready for pick up

## 2016-01-09 ENCOUNTER — Encounter (HOSPITAL_COMMUNITY): Payer: Self-pay | Admitting: Oncology

## 2016-01-09 ENCOUNTER — Emergency Department (HOSPITAL_COMMUNITY): Payer: Medicare Other

## 2016-01-09 ENCOUNTER — Emergency Department (HOSPITAL_COMMUNITY)
Admission: EM | Admit: 2016-01-09 | Discharge: 2016-01-09 | Disposition: A | Discharge status: Home / Self Care | Payer: Medicare Other | Attending: Emergency Medicine | Admitting: Emergency Medicine

## 2016-01-09 DIAGNOSIS — W25XXXA Contact with sharp glass, initial encounter: Secondary | ICD-10-CM | POA: Diagnosis not present

## 2016-01-09 DIAGNOSIS — F17210 Nicotine dependence, cigarettes, uncomplicated: Secondary | ICD-10-CM | POA: Diagnosis not present

## 2016-01-09 DIAGNOSIS — Z21 Asymptomatic human immunodeficiency virus [HIV] infection status: Secondary | ICD-10-CM | POA: Diagnosis not present

## 2016-01-09 DIAGNOSIS — Y929 Unspecified place or not applicable: Secondary | ICD-10-CM | POA: Diagnosis not present

## 2016-01-09 DIAGNOSIS — S61211A Laceration without foreign body of left index finger without damage to nail, initial encounter: Secondary | ICD-10-CM | POA: Insufficient documentation

## 2016-01-09 DIAGNOSIS — Z23 Encounter for immunization: Secondary | ICD-10-CM | POA: Insufficient documentation

## 2016-01-09 DIAGNOSIS — Y9389 Activity, other specified: Secondary | ICD-10-CM | POA: Diagnosis not present

## 2016-01-09 DIAGNOSIS — S61412A Laceration without foreign body of left hand, initial encounter: Secondary | ICD-10-CM | POA: Diagnosis not present

## 2016-01-09 DIAGNOSIS — J441 Chronic obstructive pulmonary disease with (acute) exacerbation: Secondary | ICD-10-CM | POA: Diagnosis not present

## 2016-01-09 DIAGNOSIS — Z79899 Other long term (current) drug therapy: Secondary | ICD-10-CM | POA: Diagnosis not present

## 2016-01-09 DIAGNOSIS — Y999 Unspecified external cause status: Secondary | ICD-10-CM | POA: Diagnosis not present

## 2016-01-09 DIAGNOSIS — IMO0002 Reserved for concepts with insufficient information to code with codable children: Secondary | ICD-10-CM

## 2016-01-09 MED ORDER — TETANUS-DIPHTH-ACELL PERTUSSIS 5-2.5-18.5 LF-MCG/0.5 IM SUSP
0.5000 mL | Freq: Once | INTRAMUSCULAR | Status: AC
  Administered 2016-01-09: 0.5 mL via INTRAMUSCULAR
  Filled 2016-01-09: qty 0.5

## 2016-01-09 MED ORDER — BACITRACIN ZINC 500 UNIT/GM EX OINT
TOPICAL_OINTMENT | Freq: Two times a day (BID) | CUTANEOUS | Status: DC
  Administered 2016-01-09: 1 via TOPICAL
  Filled 2016-01-09: qty 28.35

## 2016-01-09 NOTE — ED Notes (Signed)
Pt has laceration to his medial 3rd phalange and web aspect of his lf. Hand. Tetanus last in 2011, per pt.  Bleeding is controlled.

## 2016-01-09 NOTE — Discharge Instructions (Signed)
Mr. Daymon LarsenGary W Dumlao,  Nice meeting you! Please follow-up with your primary care provider. Return to the emergency department if you develop fevers, chills, inability to move your finger, numbness/tingling in your finger, new/worsening symptoms. Feel better soon!  S. Lane HackerNicole Angelia Hazell, PA-C

## 2016-01-09 NOTE — ED Notes (Addendum)
Pt states he was cleaning a lamp shade when he cut his left hand on a broken light bulb.  PTA pt applies liquid band aid to laceration on left index finger as well as laceration between index and middle finger.  Bleeding is controlled at this time.

## 2016-01-09 NOTE — ED Provider Notes (Signed)
CSN: [id number]     Arrival date & time 01/09/16  1924 History   First MD Initiated Contact with Patient 01/09/16 2234     Chief Complaint  Patient presents with  . Extremity Laceration   HPI  [name] is a 55 y.o. male PMH significant for hepatitis B, HIV presenting with an extremity laceration that occurred tonight. He states he was cleaning a lamp shade, reached his hand inside, and did not realize the light bulb in the lamp was broken. He applied liquid bandaid to the laceration. Bleeding stopped PTA. He denies fevers, chills, lightheadedness, CP, SOB, abdominal pain, N/V, changes in bowel/bladder habits, injury elsewhere. Last tetanus was 2011.   Past Medical History  Diagnosis Date  . Chronic hepatitis B (HCC)   . HIV (human immunodeficiency virus infection) (HCC)   . Cholesteatoma   . Depression   . Marfan syndrome     Dr Ninetta Lights follows  . Venous insufficiency (chronic) (peripheral)   . Mitral valve prolapse   . VT (ventricular tachycardia) (HCC)   . COPD (chronic obstructive pulmonary disease) (HCC)   . COPD exacerbation (HCC) 12/08/2014  . Folliculitis barbae 12/08/2014  . Rhinosinusitis 12/08/2014  . ILD (interstitial lung disease) (HCC) 12/08/2014  . 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 GERD (gastroesophageal reflux disease) 06/09/2015   Past Surgical History  Procedure Laterality Date  . Cholesteotoma removal    . Appendectomy    . Repair of perforated colon    . Left heart catheterization with coronary angiogram [name] 09/24/2013    Procedure: LEFT HEART CATHETERIZATION WITH CORONARY ANGIOGRAM;  Surgeon: Marykay Lex, MD;  Location: [facility] CATH LAB;  Service: Cardiovascular;  Laterality: [name];   Family History  Problem Relation Age of Onset  . Cancer Mother   . Hypertension Mother   . Stroke Sister   . Hypertension Sister   . Aneurysm Sister    Social History  Substance Use Topics  . Smoking status: Current Some Day Smoker -- 0.25  packs/day for 35 years    Types: Cigarettes  . Smokeless tobacco: Never Used     Comment: trying to cut back  . Alcohol Use: No    Review of Systems  Ten systems are reviewed and are negative for acute change except as noted in the HPI  Allergies  Tetracycline hcl  Home Medications   Prior to Admission medications   Medication Sig Start Date End Date Taking? Authorizing Provider  ALPRAZolam Prudy Feeler) 0.5 MG tablet Take 0.5 mg by mouth 3 (three) times daily.     Historical Provider, MD  clonazePAM (KLONOPIN) 1 MG tablet Take 1 mg by mouth at bedtime.    Historical Provider, MD  desonide (DESOWEN) 0.05 % ointment as directed.  04/29/14   Historical Provider, MD  dolutegravir (TIVICAY) 50 MG tablet Take 1 tablet (50 mg total) by mouth daily. 06/09/15   Randall Hiss, MD  emtricitabine-tenofovir AF (DESCOVY) 200-25 MG tablet Take 1 tablet by mouth daily. 06/09/15   Randall Hiss, MD  furosemide (LASIX) 40 MG tablet TAKE 1/2 TABLET (20 MG TOTAL) BY MOUTH EVERY MORNING. 07/09/15   Ginnie Smart, MD  Morphine Sulfate 40 MG CP24 Take 40 mg by mouth 2 (two) times daily. 12/15/15   Ginnie Smart, MD  Multiple Vitamins-Minerals (MULTIVITAMIN WITH MINERALS) tablet Take 1 tablet by mouth daily.    Historical Provider, MD  nortriptyline (PAMELOR) 75 MG capsule Take 150 mg  by mouth at bedtime.    Historical Provider, MD  promethazine (PHENERGAN) 25 MG tablet TAKE 1 TABLET BY MOUTH EVERY 8 HOURS AS NEEDED. 06/08/15   Ginnie SmartJeffrey C Hatcher, MD  spironolactone (ALDACTONE) 25 MG tablet TAKE 1 TABLET BY MOUTH ONCE DAILY. 06/04/15   Ginnie SmartJeffrey C Hatcher, MD  spironolactone (ALDACTONE) 25 MG tablet TAKE 1 TABLET BY MOUTH ONCE DAILY. 09/04/15   Ginnie SmartJeffrey C Hatcher, MD  sulfamethoxazole-trimethoprim (BACTRIM DS,SEPTRA DS) 800-160 MG tablet TAKE 1 TABLET BY MOUTH 3 TIMES A WEEK. 11/18/15   Ginnie SmartJeffrey C Hatcher, MD   BP 128/92 mmHg  Pulse 91  Temp(Src) 97.7 F (36.5 C) (Oral)  Resp 18  Ht 6\' 2"  (1.88 m)  Wt  96.616 kg  BMI 27.34 kg/m2  SpO2 95% Physical Exam  Constitutional: He appears well-developed and well-nourished. No distress.  HENT:  Head: Normocephalic and atraumatic.  Eyes: Conjunctivae are normal. Right eye exhibits no discharge. Left eye exhibits no discharge. No scleral icterus.  Neck: Normal range of motion.  Cardiovascular: Normal rate.   Pulmonary/Chest: Effort normal. No respiratory distress.  Abdominal: Soft. He exhibits no distension.  Musculoskeletal: He exhibits no edema.  Neurological: He is alert. Coordination normal.  Skin: Skin is warm and dry. No rash noted. He is not diaphoretic. No erythema.  2 cm superficial left medial index finger laceration extends into webbing between index and middle finger. Bleeding controlled. Laceration well-approximated and superficial  Psychiatric: He has a normal mood and affect. His behavior is normal.  Nursing note and vitals reviewed.   ED Course  Procedures  Imaging Review Dg Hand Complete Left  01/09/2016  CLINICAL DATA:  55 year old male with history of laceration between the left index and middle finger from glass. EXAM: LEFT HAND - COMPLETE 3+ VIEW COMPARISON:  No priors. FINDINGS: There is no evidence of fracture or dislocation. There is no evidence of arthropathy or other focal bone abnormality. Soft tissues are unremarkable. IMPRESSION: Negative. Electronically Signed   By: Trudie Reedaniel  Entrikin M.D.   On: 01/09/2016 21:05   I have personally reviewed and evaluated these images and lab results as part of my medical decision-making.  MDM   Final diagnoses:  Laceration   Pressure irrigation performed. Wound explored and base of wound visualized in a bloodless field without evidence of foreign body.  Wound is superficial and approximated; no need for repair. Tdap updated.  Pt states he is currently taking bactrim (prescribed by ID). Discussed laceration home care with patient and answered questions. Pt to return to the ED sooner  for signs of infection. Pt is hemodynamically stable with no complaints prior to dc.   Melton KrebsSamantha Nicole Oluchi Pucci, PA-C 01/22/16 1417  Lorre NickAnthony Allen, MD 02/07/16 [phone]

## 2016-01-12 ENCOUNTER — Other Ambulatory Visit: Payer: Self-pay | Admitting: *Deleted

## 2016-01-12 DIAGNOSIS — G629 Polyneuropathy, unspecified: Secondary | ICD-10-CM

## 2016-01-12 MED ORDER — MORPHINE SULFATE ER 40 MG PO CP24: 40.0000 mg | ORAL_CAPSULE | Freq: Two times a day (BID) | ORAL | Status: DC

## 2016-02-10 ENCOUNTER — Telehealth: Payer: Self-pay | Admitting: Infectious Diseases

## 2016-02-10 ENCOUNTER — Other Ambulatory Visit: Payer: Self-pay | Admitting: *Deleted

## 2016-02-10 DIAGNOSIS — G629 Polyneuropathy, unspecified: Secondary | ICD-10-CM

## 2016-02-10 MED ORDER — MORPHINE SULFATE ER 40 MG PO CP24: 40.0000 mg | ORAL_CAPSULE | Freq: Two times a day (BID) | ORAL | 0 refills | Status: DC

## 2016-02-10 NOTE — Telephone Encounter (Signed)
Pt needing refill of his chronic pain rx.  He has received no other narcotic rx's in the last 6 months.  He needs a UDS

## 2016-02-12 ENCOUNTER — Other Ambulatory Visit: Payer: Medicare Other

## 2016-02-12 DIAGNOSIS — Z79891 Long term (current) use of opiate analgesic: Secondary | ICD-10-CM | POA: Diagnosis not present

## 2016-02-12 DIAGNOSIS — B20 Human immunodeficiency virus [HIV] disease: Secondary | ICD-10-CM

## 2016-02-12 DIAGNOSIS — Z113 Encounter for screening for infections with a predominantly sexual mode of transmission: Secondary | ICD-10-CM | POA: Diagnosis not present

## 2016-02-12 DIAGNOSIS — Z79899 Other long term (current) drug therapy: Secondary | ICD-10-CM

## 2016-02-12 DIAGNOSIS — G589 Mononeuropathy, unspecified: Secondary | ICD-10-CM

## 2016-02-12 LAB — COMPREHENSIVE METABOLIC PANEL
ALBUMIN: 4 g/dL (ref 3.6–5.1)
ALT: 8 U/L — ABNORMAL LOW (ref 9–46)
AST: 10 U/L (ref 10–35)
Alkaline Phosphatase: 98 U/L (ref 40–115)
BUN: 17 mg/dL (ref 7–25)
CALCIUM: 9.4 mg/dL (ref 8.6–10.3)
CHLORIDE: 99 mmol/L (ref 98–110)
CO2: 22 mmol/L (ref 20–31)
Creat: 1.14 mg/dL (ref 0.70–1.33)
Glucose, Bld: 122 mg/dL — ABNORMAL HIGH (ref 65–99)
Potassium: 3.9 mmol/L (ref 3.5–5.3)
Sodium: 134 mmol/L — ABNORMAL LOW (ref 135–146)
TOTAL PROTEIN: 7.2 g/dL (ref 6.1–8.1)
Total Bilirubin: 0.7 mg/dL (ref 0.2–1.2)

## 2016-02-12 LAB — CBC
HEMATOCRIT: 38.9 % (ref 38.5–50.0)
HEMOGLOBIN: 13.1 g/dL — AB (ref 13.2–17.1)
MCH: 28 pg (ref 27.0–33.0)
MCHC: 33.7 g/dL (ref 32.0–36.0)
MCV: 83.1 fL (ref 80.0–100.0)
MPV: 9.1 fL (ref 7.5–12.5)
Platelets: 193 10*3/uL (ref 140–400)
RBC: 4.68 MIL/uL (ref 4.20–5.80)
RDW: 15.1 % — ABNORMAL HIGH (ref 11.0–15.0)
WBC: 6.3 10*3/uL (ref 3.8–10.8)

## 2016-02-12 LAB — LIPID PANEL
CHOLESTEROL: 144 mg/dL (ref 125–200)
HDL: 43 mg/dL (ref >=40)
LDL Cholesterol: 86 mg/dL (ref <130)
TRIGLYCERIDES: 76 mg/dL (ref <150)
Total CHOL/HDL Ratio: 3.3 Ratio (ref <=5.0)
VLDL: 15 mg/dL (ref <30)

## 2016-02-12 LAB — T-HELPER CELL (CD4) - (RCID CLINIC ONLY)
CD4 % Helper T Cell: 9 % — ABNORMAL LOW (ref 33–55)
CD4 T Cell Abs: 120 /uL — ABNORMAL LOW (ref 400–2700)

## 2016-02-13 LAB — RPR

## 2016-02-16 LAB — HIV-1 RNA QUANT-NO REFLEX-BLD

## 2016-02-20 LAB — 726778 7+ALC-UNBUND
AMPHETAMINES, URINE: NEGATIVE ng/mL
BARBITURATE QUANT UR: NEGATIVE ng/mL
BENZODIAZEPINE QUANT UR: POSITIVE — AB
Cannabinoid Quant, Ur: NEGATIVE ng/mL
Cocaine (Metab.): NEGATIVE ng/mL
Ethanol U, Quan: NEGATIVE %
OPIATE QUANT UR: POSITIVE — AB
PCP Quant, Ur: NEGATIVE ng/mL

## 2016-02-20 LAB — SPECIMEN STATUS REPORT

## 2016-03-08 ENCOUNTER — Other Ambulatory Visit: Payer: Self-pay | Admitting: Infectious Diseases

## 2016-03-08 DIAGNOSIS — G629 Polyneuropathy, unspecified: Secondary | ICD-10-CM

## 2016-03-09 ENCOUNTER — Ambulatory Visit (INDEPENDENT_AMBULATORY_CARE_PROVIDER_SITE_OTHER): Payer: Medicare Other | Admitting: Podiatry

## 2016-03-09 ENCOUNTER — Telehealth: Payer: Self-pay | Admitting: *Deleted

## 2016-03-09 ENCOUNTER — Ambulatory Visit (INDEPENDENT_AMBULATORY_CARE_PROVIDER_SITE_OTHER): Payer: Medicare Other

## 2016-03-09 ENCOUNTER — Encounter: Payer: Self-pay | Admitting: Podiatry

## 2016-03-09 VITALS — BP 121/77 | HR 91 | Temp 97.6°F | Resp 18

## 2016-03-09 DIAGNOSIS — L98491 Non-pressure chronic ulcer of skin of other sites limited to breakdown of skin: Secondary | ICD-10-CM

## 2016-03-09 DIAGNOSIS — R52 Pain, unspecified: Secondary | ICD-10-CM

## 2016-03-09 DIAGNOSIS — L02611 Cutaneous abscess of right foot: Secondary | ICD-10-CM | POA: Diagnosis not present

## 2016-03-09 DIAGNOSIS — G629 Polyneuropathy, unspecified: Secondary | ICD-10-CM

## 2016-03-09 DIAGNOSIS — L03031 Cellulitis of right toe: Secondary | ICD-10-CM | POA: Diagnosis not present

## 2016-03-09 MED ORDER — SILVER SULFADIAZINE 1 % EX CREA: 1.0000 "application " | TOPICAL_CREAM | Freq: Every day | CUTANEOUS | 0 refills | Status: DC

## 2016-03-09 NOTE — Telephone Encounter (Signed)
Patient UDS from 02/12/16 available for review.  See results under [id number].   Dr. Ninetta LightsHatcher OK to print refill for your signature?  Please advise.  The patient wants [facility] to call him with refill information.

## 2016-03-09 NOTE — Patient Instructions (Addendum)
Today your examination revealed a skin ulcer on the ball of your right foot and macerated superficial ulcers seen between your third and fourth toes in your right foot.  You have an existing prescription for Septra DS that is indicated do not use Take one Septra DS twice a day 7 days Use regular hygiene on right foot and apply Silvadene to the ulcers and between your toes on the right foot separate with gauze Apply Silvadene cream to the skin ulcer on the ball of the right foot as well cover with gauze I sent a prescription to your pharmacy for the Silvadene cream today You have existing surgical shoe which you need to wear a new right foot and linear standing walking  If you develop any sudden increase in pain, swelling, redness, fever to present to the emergency room

## 2016-03-10 NOTE — Progress Notes (Signed)
Patient ID: [name], male   DOB: Feb 18, 1961, 55 y.o.   MRN: [id number]   Subjective: This patient presents today with approximately six-month history of a plantar skin lesion on the fifth right MPJ area. Over this period of time patient has noticed the size of the area has reduced in size. Patient has been applying topical over-the-counter organal oil to the area on a daily basis. He also has noted some moisture in the webspaces of the right foot during the same time. He also states that he may have injured his right foot 1-2 weeks. He cannot directly described the accidental injury but does describe a nail sloughing on the second right. . He describes a rather chronic edema in his right lower extremity that has associated with venous disease and describes pending surgical surgery for the ongoing venous disease.   Patient states that he is HIV positive He denies taking any medication for diabetes although diabetes as mentioned on his medical record  Review of systems: Positive for swelling Positive for color change All other systems negative  Objective: BP 121/77 Pulse 91 Respiration 18 Temperature 97.6 Fahrenheit  Patient is orientated 3  Vascular: No calf tenderness bilaterally Edema right lower leg pitting  nonpitting edema left lower extremity DP pulses 2/4 bilaterally PT pulses 2/4 bilaterally Capillary reflex immediate bilaterally  Neurological: Sensation to 10 g monofilament wire intact 0/5 bilaterally Vibratory sensation nonreactive bilaterally Ankle reflex equal and reactive bilaterally   Dermatological: Hyperpigmentation lower extremities bilaterally Superficial skin ulcer 10 x 20 mm plantar fifth right MPJ with granular base does not probe deep Macerated third and fourth webspaces with some superficial tissue breakdown and superficial ulceration No malodor is noted or active drainage in the third and fourth webspaces Partial slough the second right  toenail Erythematous and edematous fourth right toe  Musculoskeletal: No restriction ankle, subtalar, midtarsal joints bilaterally  X-ray examination weightbearing right foot dated 03/09/2016  Intact bony structure without fracture Subluxed fifth MPJ left No gas formation noted Bone density adequate Joint spaces adequate No evidence of cortical disruption in or around the fifth MPJ  Radiographic impression right foot 03/09/2016 No acute bony abnormality noted Subluxed fifth left MPJ   Comprehensive metabolic panel dated 02/12/2016 Demonstrates increased blood glucose at 122 Increased sodium at 134 CBC hemoglobin low at 13.1 RDW slightly elevated 15.1 T helper cells dated 02/12/2016 abnormal  Assessment: Peripheral neuropathy Superficial ulceration fifth right MPJ Superficial ulceration macerated third and fourth right webspaces Cellulitis fourth right toe Peripheral edema most likely associated with stasis and venous disease  Plan: At this time I made patient aware that he had a foot ulceration on the plantar aspect of right foot Also the fourth right toe was erythematous and edematous with macerated web spaces associated with infection Patient admits that he has Septra DS currently in date that was prescribed for respiratory problems, however, he admits that he did not the Septra as prescribed Patient instructed to take Septra DS by mouth twice a day 7 days Silvadene cream prescribed to apply to plantar skin ulcer right and third and fourth webspace macerated areas and dressed with gauze Patient has existing surgical shoe which she is instructed to wear Advised to limit standing and walking If he develops any sudden pain, swelling, redness, fever present to the emergency department  Reevaluate 7 days

## 2016-03-10 NOTE — Telephone Encounter (Signed)
Ok to refill   thanks

## 2016-03-11 ENCOUNTER — Other Ambulatory Visit: Payer: Self-pay | Admitting: *Deleted

## 2016-03-11 DIAGNOSIS — G629 Polyneuropathy, unspecified: Secondary | ICD-10-CM

## 2016-03-11 MED ORDER — MORPHINE SULFATE ER 40 MG PO CP24: 40.0000 mg | ORAL_CAPSULE | Freq: Two times a day (BID) | ORAL | 0 refills | Status: DC

## 2016-03-15 ENCOUNTER — Ambulatory Visit: Payer: Medicare Other | Admitting: Podiatry

## 2016-03-16 ENCOUNTER — Encounter: Payer: Self-pay | Admitting: Podiatry

## 2016-03-16 ENCOUNTER — Ambulatory Visit (INDEPENDENT_AMBULATORY_CARE_PROVIDER_SITE_OTHER): Payer: Medicare Other | Admitting: Podiatry

## 2016-03-16 VITALS — BP 166/84 | HR 86 | Temp 96.9°F | Resp 16

## 2016-03-16 DIAGNOSIS — L03031 Cellulitis of right toe: Secondary | ICD-10-CM | POA: Diagnosis not present

## 2016-03-16 DIAGNOSIS — L98491 Non-pressure chronic ulcer of skin of other sites limited to breakdown of skin: Secondary | ICD-10-CM

## 2016-03-16 DIAGNOSIS — L02611 Cutaneous abscess of right foot: Secondary | ICD-10-CM

## 2016-03-16 NOTE — Patient Instructions (Signed)
Betadine Soak Instructions  Purchase an 8 oz. bottle of BETADINE solution (Povidone)    Place 1 tablespoon of betadine solution in a quart of warm tap water.  Submerge your foot or feet with outer bandage intact for the initial soak; this will allow the bandage to become moist and wet for easy lift off.  Once you remove your bandage, continue to soak in the solution for 2-5 minutes.  This soak should be done once  a day.  Next, remove your foot or feet from solution, blot dry the affected area and cover.    Apply Silvadene cream to all ulcer sites in between the toes second third and fourth and the bottom of the right foot daily and cover with gauze  Continue taking Septra DS by mouth 1 tablet twice a day and additional 7 days  Continue wearing your surgical shoe on right foot Limit your standing and walking If you develop any sudden fever, swelling, redness, warmth present to the emergency department

## 2016-03-16 NOTE — Progress Notes (Signed)
Subjective:    Patient ID: [name], male    DOB: [birth year], 55 y.o.   MRN: [id number]  HPI This patient presents for follow-up from the visit of 03/09/2016. Treatment was initiated for neuropathic interdigital ulcerations and plantar ulcer in the right foot. A shin currently taking existing Septra DS twice a day without a complaint for the past 7 days. Is also applying Silvadene cream to the areas covering with gauze and wearing surgical shoe. At this time patient states that the swelling has reduced in the right foot and he denies any complaints from the medications.  Patient is history of these lesions on the initial visit of 03/09/2016: This patient presents today with approximately six-month history of a plantar skin lesion on the fifth right MPJ area. Over this period of time patient has noticed the size of the area has reduced in size. Patient has been applying topical over-the-counter organal oil to the area on a daily basis. He also has noted some moisture in the webspaces of the right foot during the same time. He also states that he may have injured his right foot 1-2 weeks. He cannot directly described the accidental injury but does describe a nail sloughing on the second right. . He describes a rather chronic edema in his right lower extremity that has associated with venous disease and describes pending surgical surgery for the ongoing venous disease.    Review of Systems  All other systems reviewed and are negative. Review of systems: Positive for swelling Positive for color change All other systems negative  Patient states that he is HIV positive He denies taking any medication for diabetes although diabetes as mentioned on his medical record      Objective:   Physical Exam  BP of 166/84 Pulse 86  respirations 16 Temperature 96.9 Fahrenheit  Vascular: No calf tenderness bilaterally Edema right lower leg pitting  nonpitting edema left lower extremity DP  pulses 2/4 bilaterally PT pulses 2/4 bilaterally Capillary reflex immediate bilaterally  Neurological: Sensation to 10 g monofilament wire intact 0/5 bilaterally Vibratory sensation nonreactive bilaterally Ankle reflex equal and reactive bilaterally   Dermatological: Hyperpigmentation lower extremities bilaterally Superficial skin ulcer (10 x 20 mm original start size) 15 x 10 mm plantar fifth right MPJ with granular base does not probe deep Macerated third and fourth webspaces with some superficial tissue breakdown and superficial ulceration No malodor is noted or active drainage in the third and fourth webspaces Partial slough the second right toenail Erythematous and edematous fourth right toe No erythema proximal to the fourth right MPJ  Musculoskeletal: No restriction ankle, subtalar, midtarsal joints bilaterally  X-ray examination weightbearing right foot dated 03/09/2016  Intact bony structure without fracture Subluxed fifth MPJ left No gas formation noted Bone density adequate Joint spaces adequate No evidence of cortical disruption in or around the fifth MPJ  Radiographic impression right foot 03/09/2016 No acute bony abnormality noted Subluxed fifth left MPJ          Assessment & Plan:   Assessment: Peripheral neuropathy Superficial ulceration fifth right MPJ With slight adduction in size from initial visit Superficial ulceration macerated third and fourth right webspaces Cellulitis fourth right toe, with mild improvement Peripheral edema most likely associated with stasis and venous disease  Plan: At this time I made patient aware that he had a foot ulceration on the plantar aspect of right foot Also the fourth right toe was erythematous and edematous with macerated web spaces associated with infection  Continue Septra DS by mouth twice a day additional 7 days currently in date that was prescribed for respiratory problems, however, he admits that  he did not the Septra as prescribed Patient instructed to take Septra DS by mouth twice a day 7 days Begin diluted Betadine soaks daily Silvadene cream prescribed to apply to plantar skin ulcer right and third and fourth webspace macerated areas and dressed with gauze Patient has existing surgical shoe which she is instructed to wear Advised to limit standing and walking If he develops any sudden pain, swelling, redness, fever present to the emergency department  Consider referral to wound care after evaluation at next visit in 7 days  Reevaluate 7 days

## 2016-03-23 ENCOUNTER — Ambulatory Visit (INDEPENDENT_AMBULATORY_CARE_PROVIDER_SITE_OTHER): Payer: Medicare Other | Admitting: Podiatry

## 2016-03-23 ENCOUNTER — Encounter: Payer: Self-pay | Admitting: Podiatry

## 2016-03-23 VITALS — BP 118/73 | HR 95 | Temp 97.8°F | Resp 18

## 2016-03-23 DIAGNOSIS — L89891 Pressure ulcer of other site, stage 1: Secondary | ICD-10-CM

## 2016-03-23 DIAGNOSIS — L98491 Non-pressure chronic ulcer of skin of other sites limited to breakdown of skin: Secondary | ICD-10-CM

## 2016-03-23 NOTE — Patient Instructions (Signed)
Continue on your Septra DS 1 tablet twice a day initial 7 days Apply Silvadene cream to skin ulcer on the bottom of the right foot and in between the second and third toes and right foot Dress all these areas with gauze daily Maintain a surgical shoe on the right foot Maintain daily Betadine soaks Will contact wound care for follow-up and until transfers arranged will see weighted 70 intervals

## 2016-03-24 NOTE — Addendum Note (Signed)
Addended by: Alphia Kava'CONNELL, Marvelene Stoneberg D on: 03/24/2016 09:47 AM   Modules accepted: Orders

## 2016-03-24 NOTE — Progress Notes (Signed)
Patient ID: [name], male   DOB: [birth year], 55 y.o.   MRN: [id number]   Subjective: HPI This patient presents for follow-up from the visit of 03/16/2016. Initial evaluation on 03/09/2016 Treatment was initiated for neuropathic interdigital ulcerations and plantar ulcer in the right foot. A shin currently taking existing Septra DS twice a day without a complaint for the past 7 days. Is also applying Silvadene cream to the areas covering with gauze and wearing surgical shoe. At this time patient states that the swelling has reduced in the right foot and he denies any complaints from the medications. On the visit of 03/23/2016 patient admits that he does not wear surgical shoe consistently. He states that he is continuing to Septra DS twice a day without a complaint and applying Silvadene cream to the wounds.  Patient is history of these lesions on the initial visit of 03/09/2016: This patient presents today with approximately six-month history of a plantar skin lesion on the fifth right MPJ area. Over this period of time patient has noticed the size of the area has reduced in size. Patient has been applying topical over-the-counter organal oil to the area on a daily basis. He also has noted some moisture in the webspaces of the right foot during the same time. He also states that he may have injured his right foot 1-2 weeks. He cannot directly described the accidental injury but does describe a nail sloughing on the second right. . He describes a rather chronic edema in his right lower extremity that has associated with venous disease and describes pending surgical surgery for the ongoing venous disease.   Objective: BP 118/73 Pulse 95 Rest for 18 Temperature 97.8 F  Orientated 3   Vascular: No calf tenderness bilaterally Edema right lower leg pitting (edema reducing right lower extremity from initial start of 03/09/2016) nonpitting edema left lower extremity DP pulses 2/4 bilaterally PT  pulses 2/4 bilaterally Capillary reflex immediate bilaterally  Neurological: Sensation to 10 g monofilament wire intact 0/5 bilaterally Vibratory sensation nonreactive bilaterally Ankle reflex equal and reactive bilaterally   Dermatological: Hyperpigmentation lower extremities bilaterally Superficial skin ulcer (10 x 20 mm original start size) 15 x 10 mm plantar fifth right MPJ with granular base does not probe deep Macerated third and fourth webspaces with some superficial tissue breakdown and superficial ulceration No malodor is noted or active drainage in the third and fourth webspaces Partial slough the second right toenail Erythematous and edematous fourth right toe, reducing from initial visit of 03/09/2016 No erythema proximal to the fourth right MPJ  Musculoskeletal: No restriction ankle, subtalar, midtarsal joints bilaterally  X-ray examination weightbearing right foot dated 03/09/2016  Intact bony structure without fracture Subluxed fifth MPJ left No gas formation noted Bone density adequate Joint spaces adequate No evidence of cortical disruption in or around the fifth MPJ  Radiographic impression right foot 03/09/2016 No acute bony abnormality noted Subluxed fifth left MPJ        Assessment: Peripheral neuropathy Superficial ulceration fifth right MPJ With slight adduction in size from initial visit Superficial ulceration macerated third and fourth right webspaces Cellulitis fourth right toe, with mild improvement Peripheral edema most likely associated with stasis and venous disease Noncompliance of patient and she admits she does not wear the surgical shoe on a continuous basis HIV positive  Plan: At this time I made patient aware that he had a foot ulceration on the plantar aspect of right foot Also the fourth right toe was erythematous and  edematous with macerated web spaces associated with infection Continue Septra DS by mouth twice a  day additional 7 days currently in date that was prescribed for respiratory problems, however, he admits that he did not the Septra as prescribed Patient instructed to continue Septra DS by mouth twice a day 7 days Continue diluted Betadine soaks daily Silvadene cream prescribed to apply to plantar skin ulcer right and third and fourth webspace macerated areas and dressed with gauze Patient has existing surgical shoe which she is instructed to wear Advised to limit standing and walking If he develops any sudden pain, swelling, redness, fever present to the emergency department  Contact wound center for transfer patient for skin ulcers on the right foot I will maintain treatment until transfer is obtained  Reevaluate 7 days

## 2016-03-30 ENCOUNTER — Encounter: Payer: Self-pay | Admitting: Podiatry

## 2016-03-30 ENCOUNTER — Ambulatory Visit (INDEPENDENT_AMBULATORY_CARE_PROVIDER_SITE_OTHER): Payer: Medicare Other | Admitting: Podiatry

## 2016-03-30 VITALS — BP 115/68 | HR 81 | Temp 97.7°F | Resp 18

## 2016-03-30 DIAGNOSIS — L98491 Non-pressure chronic ulcer of skin of other sites limited to breakdown of skin: Secondary | ICD-10-CM

## 2016-03-30 DIAGNOSIS — L89891 Pressure ulcer of other site, stage 1: Secondary | ICD-10-CM | POA: Diagnosis not present

## 2016-03-30 NOTE — Patient Instructions (Addendum)
Continue Betadine soaks daily Apply Silvadene between the second and third and third and fourth toes daily Apply Silvadene to skin ulcer on the right foot daily Use gauze to cover the wounds Wear surgical shoe on right foot Stop using Septra DS Pending wound care appointment 04/17/2016  Diabetes and Foot Care Diabetes may cause you to have problems because of poor blood supply (circulation) to your feet and legs. This may cause the skin on your feet to become thinner, break easier, and heal more slowly. Your skin may become dry, and the skin may peel and crack. You may also have nerve damage in your legs and feet causing decreased feeling in them. You may not notice minor injuries to your feet that could lead to infections or more serious problems. Taking care of your feet is one of the most important things you can do for yourself.  HOME CARE INSTRUCTIONS  Wear shoes at all times, even in the house. Do not go barefoot. Bare feet are easily injured.  Check your feet daily for blisters, cuts, and redness. If you cannot see the bottom of your feet, use a mirror or ask someone for help.  Wash your feet with warm water (do not use hot water) and mild soap. Then pat your feet and the areas between your toes until they are completely dry. Do not soak your feet as this can dry your skin.  Apply a moisturizing lotion or petroleum jelly (that does not contain alcohol and is unscented) to the skin on your feet and to dry, brittle toenails. Do not apply lotion between your toes.  Trim your toenails straight across. Do not dig under them or around the cuticle. File the edges of your nails with an emery board or nail file.  Do not cut corns or calluses or try to remove them with medicine.  Wear clean socks or stockings every day. Make sure they are not too tight. Do not wear knee-high stockings since they may decrease blood flow to your legs.  Wear shoes that fit properly and have enough cushioning.  To break in new shoes, wear them for just a few hours a day. This prevents you from injuring your feet. Always look in your shoes before you put them on to be sure there are no objects inside.  Do not cross your legs. This may decrease the blood flow to your feet.  If you find a minor scrape, cut, or break in the skin on your feet, keep it and the skin around it clean and dry. These areas may be cleansed with mild soap and water. Do not cleanse the area with peroxide, alcohol, or iodine.  When you remove an adhesive bandage, be sure not to damage the skin around it.  If you have a wound, look at it several times a day to make sure it is healing.  Do not use heating pads or hot water bottles. They may burn your skin. If you have lost feeling in your feet or legs, you may not know it is happening until it is too late.  Make sure your health care provider performs a complete foot exam at least annually or more often if you have foot problems. Report any cuts, sores, or bruises to your health care provider immediately. SEEK MEDICAL CARE IF:   You have an injury that is not healing.  You have cuts or breaks in the skin.  You have an ingrown nail.  You notice redness on your  legs or feet.  You feel burning or tingling in your legs or feet.  You have pain or cramps in your legs and feet.  Your legs or feet are numb.  Your feet always feel cold. SEEK IMMEDIATE MEDICAL CARE IF:   There is increasing redness, swelling, or pain in or around a wound.  There is a red line that goes up your leg.  Pus is coming from a wound.  You develop a fever or as directed by your health care provider.  You notice a bad smell coming from an ulcer or wound.   This information is not intended to replace advice given to you by your health care provider. Make sure you discuss any questions you have with your health care provider.   Document Released: 06/17/2000 Document Revised: 02/20/2013 Document  Reviewed: 11/27/2012 Elsevier Interactive Patient Education [employer].

## 2016-03-31 NOTE — Progress Notes (Signed)
Patient ID: Johnathan LarsenGary W Melka, male   DOB: May 12, 1961, 55 y.o.   MRN: [id number]   Subjective  This patient presents for follow-up from the visit of 03/23/2016. Initial evaluation on 03/09/2016 Treatment was initiated for neuropathic interdigital ulcerations and plantar ulcer in the right foot. A shin currently taking existing Septra DS twice a day without a complaint for the past 7 days. Is also applying Silvadene cream to the areas covering with gauze and wearing surgical shoe. At this time patient states that the swelling has reduced in the right foot and he denies any complaints from the medications. On the visit of 03/23/2016 patient admits that he does not wear surgical shoe consistently. He states that he is continuing to Septra DS twice a day without a complaint and applying Silvadene cream to the wounds.  Patient is history of these lesions on the initial visit of 03/09/2016: This patient presents today with approximately six-month history of a plantar skin lesion on the fifth right MPJ area. Over this period of time patient has noticed the size of the area has reduced in size. Patient has been applying topical over-the-counter organal oil to the area on a daily basis. He also has noted some moisture in the webspaces of the right foot during the same time. He also states that he may have injured his right foot 1-2 weeks. He cannot directly described the accidental injury but does describe a nail sloughing on the second right. . He describes a rather chronic edema in his right lower extremity that has associated with venous disease and describes pending surgical surgery for the ongoing venous disease.  Patient has pending wound care appointment 04/17/2016   Objective: BP 1:15/68 Pulse 81 Respiration 18 Temperature 97.5  Orientated 3   Vascular: No calf tenderness bilaterally Edema right lower leg pitting (edema reducing right lower extremity from initial start of 03/09/2016) nonpitting  edema left lower extremity DP pulses 2/4 bilaterally PT pulses 2/4 bilaterally Capillary reflex immediate bilaterally  Neurological: Sensation to 10 g monofilament wire intact 0/5 bilaterally Vibratory sensation nonreactive bilaterally Ankle reflex equal and reactive bilaterally   Dermatological: Hyperpigmentation lower extremities bilaterally Superficial skin ulcer (10 x 20 mm original start size) 15 x 10 mmplantar fifth right MPJ with granular base does not probe deep Macerated third and fourth webspaces with some superficial tissue breakdown and superficial ulceration No malodor is noted or active drainage in the third and fourth webspaces Partial slough the second right toenail Erythematous and edematous fourth right toe, reducing from initial visit of 03/09/2016 No erythema proximal to the fourth right MPJ  Musculoskeletal: No restriction ankle, subtalar, midtarsal joints bilaterally  X-ray examination weightbearing right foot dated 03/09/2016  Intact bony structure without fracture Subluxed fifth MPJ left No gas formation noted Bone density adequate Joint spaces adequate No evidence of cortical disruption in or around the fifth MPJ  Radiographic impression right foot 03/09/2016 No acute bony abnormality noted Subluxed fifth left MPJ   Assessment: Peripheral neuropathy Superficial ulceration fifth right MPJ With slight adduction in sizefrom initial visit Superficial ulceration macerated third and fourth right webspaces improving from initial visit Cellulitis fourth right toe,improving from initial visit Peripheral edema most likely associated with stasis and venous disease Noncompliance of patient and she admits she does not wear the surgical shoe on a continuous basis HIV positive  Plan: At this time I made patient aware that he had a foot ulceration on the plantar aspect of right foot Also the fourth right toe  was erythematous and edematous with  macerated web spaces associated with infection   Discontinue Septra DS    Continue diluted Betadine soaks daily Silvadene cream prescribed to apply to plantar skin ulcer right and third and fourth webspace macerated areas and dressed with gauze Patient has existing surgical shoe which she is instructed to wear Advised to limit standing and walking If he develops any sudden pain, swelling, redness, fever present to the emergency department  Maintain wound care and are office at 7 day intervals until transfer to wound care occurs on 04/17/2016  Reappoint 7 days

## 2016-03-31 NOTE — Telephone Encounter (Signed)
error 

## 2016-04-05 ENCOUNTER — Other Ambulatory Visit: Payer: Self-pay | Admitting: *Deleted

## 2016-04-05 DIAGNOSIS — G629 Polyneuropathy, unspecified: Secondary | ICD-10-CM

## 2016-04-05 MED ORDER — MORPHINE SULFATE ER 40 MG PO CP24: 40.0000 mg | ORAL_CAPSULE | Freq: Two times a day (BID) | ORAL | 0 refills | Status: DC

## 2016-04-06 ENCOUNTER — Ambulatory Visit (INDEPENDENT_AMBULATORY_CARE_PROVIDER_SITE_OTHER): Payer: Medicare Other | Admitting: Podiatry

## 2016-04-06 ENCOUNTER — Encounter: Payer: Self-pay | Admitting: Podiatry

## 2016-04-06 VITALS — BP 114/75 | HR 85 | Temp 97.7°F | Resp 18

## 2016-04-06 DIAGNOSIS — L98491 Non-pressure chronic ulcer of skin of other sites limited to breakdown of skin: Secondary | ICD-10-CM

## 2016-04-06 NOTE — Patient Instructions (Signed)
Apply Silvadene to the wounds between the second third and third and fourth toes and separate with gauze daily Apply Silvadene to the skin ulcerative on the right foot daily and cover with gauze Wear surgical shoe on the right foot Pending wound care appointment 04/17/2069  Diabetes and Foot Care Diabetes may cause you to have problems because of poor blood supply (circulation) to your feet and legs. This may cause the skin on your feet to become thinner, break easier, and heal more slowly. Your skin may become dry, and the skin may peel and crack. You may also have nerve damage in your legs and feet causing decreased feeling in them. You may not notice minor injuries to your feet that could lead to infections or more serious problems. Taking care of your feet is one of the most important things you can do for yourself.  HOME CARE INSTRUCTIONS  Wear shoes at all times, even in the house. Do not go barefoot. Bare feet are easily injured.  Check your feet daily for blisters, cuts, and redness. If you cannot see the bottom of your feet, use a mirror or ask someone for help.  Wash your feet with warm water (do not use hot water) and mild soap. Then pat your feet and the areas between your toes until they are completely dry. Do not soak your feet as this can dry your skin.  Apply a moisturizing lotion or petroleum jelly (that does not contain alcohol and is unscented) to the skin on your feet and to dry, brittle toenails. Do not apply lotion between your toes.  Trim your toenails straight across. Do not dig under them or around the cuticle. File the edges of your nails with an emery board or nail file.  Do not cut corns or calluses or try to remove them with medicine.  Wear clean socks or stockings every day. Make sure they are not too tight. Do not wear knee-high stockings since they may decrease blood flow to your legs.  Wear shoes that fit properly and have enough cushioning. To break in new  shoes, wear them for just a few hours a day. This prevents you from injuring your feet. Always look in your shoes before you put them on to be sure there are no objects inside.  Do not cross your legs. This may decrease the blood flow to your feet.  If you find a minor scrape, cut, or break in the skin on your feet, keep it and the skin around it clean and dry. These areas may be cleansed with mild soap and water. Do not cleanse the area with peroxide, alcohol, or iodine.  When you remove an adhesive bandage, be sure not to damage the skin around it.  If you have a wound, look at it several times a day to make sure it is healing.  Do not use heating pads or hot water bottles. They may burn your skin. If you have lost feeling in your feet or legs, you may not know it is happening until it is too late.  Make sure your health care provider performs a complete foot exam at least annually or more often if you have foot problems. Report any cuts, sores, or bruises to your health care provider immediately. SEEK MEDICAL CARE IF:   You have an injury that is not healing.  You have cuts or breaks in the skin.  You have an ingrown nail.  You notice redness on your legs or  feet.  You feel burning or tingling in your legs or feet.  You have pain or cramps in your legs and feet.  Your legs or feet are numb.  Your feet always feel cold. SEEK IMMEDIATE MEDICAL CARE IF:   There is increasing redness, swelling, or pain in or around a wound.  There is a red line that goes up your leg.  Pus is coming from a wound.  You develop a fever or as directed by your health care provider.  You notice a bad smell coming from an ulcer or wound.   This information is not intended to replace advice given to you by your health care provider. Make sure you discuss any questions you have with your health care provider.   Document Released: 06/17/2000 Document Revised: 02/20/2013 Document Reviewed:  11/27/2012 Elsevier Interactive Patient Education [employer].

## 2016-04-07 NOTE — Progress Notes (Signed)
Patient ID: Johnathan LarsenGary W [name], male   DOB: Jun 08, 1961, 55 y.o.   MRN: [id number]    Subjective: Patient presents for follow-up visit for skin ulcerations in the right foot. The patient has completed 2 weeks of Septra DS without any complaints.  He continues to apply Silvadene cream to the skin ulcers on the right foot on a daily basis, maintains daily Betadine soaks and wears a surgical shoe on the right foot. Patient admits that sometimes he walks without the surgical shoe. Patient has a pending appointment with the wound care center in 04/18/2016 and at that time care will be transferred to the wound care facility. In the meantime our office will maintain wound care  Objective:  BP of 114/75 Pulse 85 Respiration 18 Temperature 97.7 F    Orientated 3   Vascular: No calf tenderness bilaterally Edema right lower leg pitting(edema reducing right lower extremity from initial start of 03/09/2016) nonpitting edema left lower extremity DP pulses 2/4 bilaterally PT pulses 2/4 bilaterally Capillary reflex immediate bilaterally  Neurological: Sensation to 10 g monofilament wire intact 0/5 bilaterally Vibratory sensation nonreactive bilaterally Ankle reflex equal and reactive bilaterally   Dermatological: Hyperpigmentation lower extremities bilaterally Superficial skin ulcer (10 x 20 mm original start size) 15 x 10 mmplantar fifth right MPJ with granular base does not probe deep. Plantar fifth MPJ wound has hyperkeratotic rim  third and fourth webspaces with some superficial tissue breakdown and superficial ulceration, with moist granular base. The webspaces are no longer macerated No malodor is noted or active drainage in the third and fourth webspaces.  Partial slough the second right toenail Erythematous and edematous fourth right toe,reducing from initial visit of 03/09/2016 No erythema proximal to the fourth right MPJ Superficial 5 mm skin ulcer with a granulomararea dorsal  fourth right toe  Musculoskeletal: No restriction ankle, subtalar, midtarsal joints bilaterally  X-ray examination weightbearing right foot dated 03/09/2016  Intact bony structure without fracture Subluxed fifth MPJ left No gas formation noted Bone density adequate Joint spaces adequate No evidence of cortical disruption in or around the fifth MPJ  Radiographic impression right foot 03/09/2016 No acute bony abnormality noted Subluxed fifth left MPJ   Assessment: Peripheral neuropathy Superficial ulceration fifth right MPJ with slight adduction in sizefrom initial visit the wound is surrounded by hyperkeratotic tissue Superficial ulceration macerated third and fourth right webspaces improving from initial visit Cellulitis fourth right toe,improving from initial visit Superficial ulcer fourth right toe Peripheral edema most likely associated with stasis and venous disease Noncompliance of patient and she admits she does not wear the surgical shoe on a continuous basis HIV positive Possible diabetic?  Plan: Debride plantar ulcer fifth right MPJ and apply Silvadene dressing Continuediluted Betadine soaks daily Silvadene cream prescribed to apply to plantar skin ulcer right and third and fourth webspaces and dressed with gauze, daily Patient has existing surgical shoe which she is instructed to wear Advised to limit standing and walking  Patient instructed to present to the emergency room If he develops any sudden pain, swelling, redness, fever  Maintain wound care in our office at 7 day intervals until transfer to wound care occurs on 04/18/2016  Reappoint 7 days

## 2016-04-08 ENCOUNTER — Ambulatory Visit: Payer: Medicare Other | Admitting: Internal Medicine

## 2016-04-13 ENCOUNTER — Ambulatory Visit (INDEPENDENT_AMBULATORY_CARE_PROVIDER_SITE_OTHER): Payer: Medicare Other | Admitting: Infectious Diseases

## 2016-04-13 ENCOUNTER — Ambulatory Visit: Payer: Medicare Other | Admitting: Podiatry

## 2016-04-13 ENCOUNTER — Encounter: Payer: Self-pay | Admitting: Infectious Diseases

## 2016-04-13 VITALS — BP 138/88 | HR 96 | Temp 98.1°F | Ht 74.5 in | Wt 213.0 lb

## 2016-04-13 DIAGNOSIS — Z79899 Other long term (current) drug therapy: Secondary | ICD-10-CM | POA: Diagnosis not present

## 2016-04-13 DIAGNOSIS — Z113 Encounter for screening for infections with a predominantly sexual mode of transmission: Secondary | ICD-10-CM | POA: Diagnosis not present

## 2016-04-13 DIAGNOSIS — B20 Human immunodeficiency virus [HIV] disease: Secondary | ICD-10-CM

## 2016-04-13 NOTE — Assessment & Plan Note (Addendum)
He is doing well  His CD4 has come up incompletely.  Will continue his current rx.  Gets flu shot today Not sexually active He is going to start exercising/walking again rtc in 6 months

## 2016-04-13 NOTE — Progress Notes (Signed)
   Subjective:    Patient ID: [name], male    DOB: [birth year], 55 y.o.   MRN: [id number]  HPI 55 yo M with neuropathy, HIV/AIDS, DM that is diet controlled, suspected Marfan's syndrome, and COPD. He was started on complera 2012.   06-2015 and was changed to DTGV/Descovy  Last TTE 01-2013- mildto mod MR otherwise nl. EF nl.  Was in hospital 09-2013 with sustained VT. His cath was normal. He was treated medically.He attributes this to vaping. Has CV f/u.  He is back to smoking.  Has been feeling well- has no further dizzy spells. His L foot ulcer has been followed by podiatry. Waiting for wound to heal til he can have vein ablation by CV.  Afraid he is going to lose his housing, his sister is currently living with him.    HIV 1 RNA Quant (copies/mL)  Date Value  02/12/2016 <20  08/03/2015 <20  06/09/2015 <20   CD4 T Cell Abs (/uL)  Date Value  02/12/2016 120 (L)  08/03/2015 70 (L)  06/09/2015 90 (L)    Review of Systems  Constitutional: Negative for appetite change, chills, fever and unexpected weight change.  Gastrointestinal: Negative for constipation and diarrhea.  Genitourinary: Negative for difficulty urinating.  Skin: Positive for wound.  Neurological: Negative for dizziness.  Psychiatric/Behavioral: Negative for dysphoric mood.      Objective:   Physical Exam  Constitutional: He appears well-developed and well-nourished.  HENT:  Mouth/Throat: No oropharyngeal exudate.  Eyes: EOM are normal. Pupils are equal, round, and reactive to light.  Neck: Neck supple.  Cardiovascular: Normal rate, regular rhythm and normal heart sounds.   Pulmonary/Chest: Effort normal and breath sounds normal.  Abdominal: Soft. Bowel sounds are normal. There is no tenderness. There is no rebound.  Musculoskeletal: He exhibits edema.       Feet:  Lymphadenopathy:    He has no cervical adenopathy.      Assessment & Plan:

## 2016-04-13 NOTE — Assessment & Plan Note (Signed)
Echo 08-2015: - Left ventricle: Difficult acoustic windoes LVEF is approximately   45% with septal hypokinesis consistent with conduction delay;   mild lateral hypokinesis; basal inferior / posterior hypokinesis.   The cavity size was normal. Wall thickness was increased in a   pattern of mild LVH. - Mitral valve: Calcified annulus. Mildly thickened leaflets .   There was mild regurgitation.

## 2016-04-18 ENCOUNTER — Encounter (HOSPITAL_BASED_OUTPATIENT_CLINIC_OR_DEPARTMENT_OTHER): Payer: Medicare Other | Attending: Internal Medicine

## 2016-04-18 DIAGNOSIS — Q87418 Marfan's syndrome with other cardiovascular manifestations: Secondary | ICD-10-CM | POA: Insufficient documentation

## 2016-04-18 DIAGNOSIS — J449 Chronic obstructive pulmonary disease, unspecified: Secondary | ICD-10-CM | POA: Insufficient documentation

## 2016-04-18 DIAGNOSIS — S81811A Laceration without foreign body, right lower leg, initial encounter: Secondary | ICD-10-CM | POA: Diagnosis not present

## 2016-04-18 DIAGNOSIS — Y92531 Health care provider office as the place of occurrence of the external cause: Secondary | ICD-10-CM | POA: Insufficient documentation

## 2016-04-18 DIAGNOSIS — E1140 Type 2 diabetes mellitus with diabetic neuropathy, unspecified: Secondary | ICD-10-CM | POA: Insufficient documentation

## 2016-04-18 DIAGNOSIS — W228XXA Striking against or struck by other objects, initial encounter: Secondary | ICD-10-CM | POA: Insufficient documentation

## 2016-04-18 DIAGNOSIS — L97511 Non-pressure chronic ulcer of other part of right foot limited to breakdown of skin: Secondary | ICD-10-CM | POA: Diagnosis not present

## 2016-04-18 DIAGNOSIS — F17210 Nicotine dependence, cigarettes, uncomplicated: Secondary | ICD-10-CM | POA: Diagnosis not present

## 2016-04-18 DIAGNOSIS — I341 Nonrheumatic mitral (valve) prolapse: Secondary | ICD-10-CM | POA: Diagnosis not present

## 2016-04-18 DIAGNOSIS — B182 Chronic viral hepatitis C: Secondary | ICD-10-CM | POA: Diagnosis not present

## 2016-04-18 DIAGNOSIS — I872 Venous insufficiency (chronic) (peripheral): Secondary | ICD-10-CM | POA: Insufficient documentation

## 2016-04-18 DIAGNOSIS — L97512 Non-pressure chronic ulcer of other part of right foot with fat layer exposed: Secondary | ICD-10-CM | POA: Diagnosis not present

## 2016-04-18 DIAGNOSIS — Z21 Asymptomatic human immunodeficiency virus [HIV] infection status: Secondary | ICD-10-CM | POA: Insufficient documentation

## 2016-04-18 DIAGNOSIS — E11621 Type 2 diabetes mellitus with foot ulcer: Secondary | ICD-10-CM | POA: Insufficient documentation

## 2016-04-18 DIAGNOSIS — I890 Lymphedema, not elsewhere classified: Secondary | ICD-10-CM | POA: Diagnosis not present

## 2016-04-26 DIAGNOSIS — L97511 Non-pressure chronic ulcer of other part of right foot limited to breakdown of skin: Secondary | ICD-10-CM | POA: Diagnosis not present

## 2016-04-26 DIAGNOSIS — E11621 Type 2 diabetes mellitus with foot ulcer: Secondary | ICD-10-CM | POA: Diagnosis not present

## 2016-04-26 DIAGNOSIS — I341 Nonrheumatic mitral (valve) prolapse: Secondary | ICD-10-CM | POA: Diagnosis not present

## 2016-04-26 DIAGNOSIS — J449 Chronic obstructive pulmonary disease, unspecified: Secondary | ICD-10-CM | POA: Diagnosis not present

## 2016-04-26 DIAGNOSIS — S81811A Laceration without foreign body, right lower leg, initial encounter: Secondary | ICD-10-CM | POA: Diagnosis not present

## 2016-04-26 DIAGNOSIS — L97512 Non-pressure chronic ulcer of other part of right foot with fat layer exposed: Secondary | ICD-10-CM | POA: Diagnosis not present

## 2016-04-26 DIAGNOSIS — I872 Venous insufficiency (chronic) (peripheral): Secondary | ICD-10-CM | POA: Diagnosis not present

## 2016-04-26 DIAGNOSIS — E1140 Type 2 diabetes mellitus with diabetic neuropathy, unspecified: Secondary | ICD-10-CM | POA: Diagnosis not present

## 2016-04-27 ENCOUNTER — Other Ambulatory Visit: Payer: Self-pay | Admitting: Infectious Diseases

## 2016-04-27 DIAGNOSIS — I10 Essential (primary) hypertension: Secondary | ICD-10-CM

## 2016-05-02 DIAGNOSIS — S91104A Unspecified open wound of right lesser toe(s) without damage to nail, initial encounter: Secondary | ICD-10-CM | POA: Diagnosis not present

## 2016-05-03 DIAGNOSIS — E11621 Type 2 diabetes mellitus with foot ulcer: Secondary | ICD-10-CM | POA: Diagnosis not present

## 2016-05-03 DIAGNOSIS — L97512 Non-pressure chronic ulcer of other part of right foot with fat layer exposed: Secondary | ICD-10-CM | POA: Diagnosis not present

## 2016-05-03 DIAGNOSIS — E1140 Type 2 diabetes mellitus with diabetic neuropathy, unspecified: Secondary | ICD-10-CM | POA: Diagnosis not present

## 2016-05-03 DIAGNOSIS — I890 Lymphedema, not elsewhere classified: Secondary | ICD-10-CM | POA: Diagnosis not present

## 2016-05-03 DIAGNOSIS — J449 Chronic obstructive pulmonary disease, unspecified: Secondary | ICD-10-CM | POA: Diagnosis not present

## 2016-05-03 DIAGNOSIS — I872 Venous insufficiency (chronic) (peripheral): Secondary | ICD-10-CM | POA: Diagnosis not present

## 2016-05-03 DIAGNOSIS — I341 Nonrheumatic mitral (valve) prolapse: Secondary | ICD-10-CM | POA: Diagnosis not present

## 2016-05-03 DIAGNOSIS — L97511 Non-pressure chronic ulcer of other part of right foot limited to breakdown of skin: Secondary | ICD-10-CM | POA: Diagnosis not present

## 2016-05-03 DIAGNOSIS — S81811A Laceration without foreign body, right lower leg, initial encounter: Secondary | ICD-10-CM | POA: Diagnosis not present

## 2016-05-04 ENCOUNTER — Other Ambulatory Visit: Payer: Self-pay | Admitting: Infectious Diseases

## 2016-05-04 DIAGNOSIS — S91104A Unspecified open wound of right lesser toe(s) without damage to nail, initial encounter: Secondary | ICD-10-CM | POA: Diagnosis not present

## 2016-05-06 ENCOUNTER — Other Ambulatory Visit: Payer: Self-pay | Admitting: Surgery

## 2016-05-06 ENCOUNTER — Ambulatory Visit (HOSPITAL_COMMUNITY)
Admission: RE | Admit: 2016-05-06 | Discharge: 2016-05-06 | Disposition: A | Discharge status: Home / Self Care | Payer: Medicare Other | Source: Ambulatory Visit | Attending: Surgery | Admitting: Surgery

## 2016-05-06 DIAGNOSIS — M869 Osteomyelitis, unspecified: Secondary | ICD-10-CM

## 2016-05-06 DIAGNOSIS — M868X7 Other osteomyelitis, ankle and foot: Secondary | ICD-10-CM | POA: Diagnosis not present

## 2016-05-06 DIAGNOSIS — M7989 Other specified soft tissue disorders: Secondary | ICD-10-CM | POA: Insufficient documentation

## 2016-05-10 ENCOUNTER — Other Ambulatory Visit: Payer: Self-pay | Admitting: *Deleted

## 2016-05-10 ENCOUNTER — Encounter (HOSPITAL_BASED_OUTPATIENT_CLINIC_OR_DEPARTMENT_OTHER): Payer: Medicare Other | Attending: Surgery

## 2016-05-10 DIAGNOSIS — J449 Chronic obstructive pulmonary disease, unspecified: Secondary | ICD-10-CM | POA: Insufficient documentation

## 2016-05-10 DIAGNOSIS — E1140 Type 2 diabetes mellitus with diabetic neuropathy, unspecified: Secondary | ICD-10-CM | POA: Diagnosis not present

## 2016-05-10 DIAGNOSIS — L97512 Non-pressure chronic ulcer of other part of right foot with fat layer exposed: Secondary | ICD-10-CM | POA: Diagnosis not present

## 2016-05-10 DIAGNOSIS — G629 Polyneuropathy, unspecified: Secondary | ICD-10-CM

## 2016-05-10 DIAGNOSIS — E11621 Type 2 diabetes mellitus with foot ulcer: Secondary | ICD-10-CM | POA: Insufficient documentation

## 2016-05-10 DIAGNOSIS — Q87418 Marfan's syndrome with other cardiovascular manifestations: Secondary | ICD-10-CM | POA: Diagnosis not present

## 2016-05-10 DIAGNOSIS — X58XXXA Exposure to other specified factors, initial encounter: Secondary | ICD-10-CM | POA: Insufficient documentation

## 2016-05-10 DIAGNOSIS — I509 Heart failure, unspecified: Secondary | ICD-10-CM | POA: Diagnosis not present

## 2016-05-10 DIAGNOSIS — I890 Lymphedema, not elsewhere classified: Secondary | ICD-10-CM | POA: Diagnosis not present

## 2016-05-10 DIAGNOSIS — Z87891 Personal history of nicotine dependence: Secondary | ICD-10-CM | POA: Diagnosis not present

## 2016-05-10 DIAGNOSIS — B20 Human immunodeficiency virus [HIV] disease: Secondary | ICD-10-CM | POA: Insufficient documentation

## 2016-05-10 DIAGNOSIS — S81812A Laceration without foreign body, left lower leg, initial encounter: Secondary | ICD-10-CM | POA: Insufficient documentation

## 2016-05-10 DIAGNOSIS — B182 Chronic viral hepatitis C: Secondary | ICD-10-CM | POA: Insufficient documentation

## 2016-05-10 DIAGNOSIS — I872 Venous insufficiency (chronic) (peripheral): Secondary | ICD-10-CM | POA: Diagnosis not present

## 2016-05-10 DIAGNOSIS — M1990 Unspecified osteoarthritis, unspecified site: Secondary | ICD-10-CM | POA: Diagnosis not present

## 2016-05-10 MED ORDER — MORPHINE SULFATE ER 40 MG PO CP24: 40.0000 mg | ORAL_CAPSULE | Freq: Two times a day (BID) | ORAL | 0 refills | Status: DC

## 2016-05-12 ENCOUNTER — Other Ambulatory Visit: Payer: Self-pay | Admitting: Surgery

## 2016-05-12 DIAGNOSIS — L97512 Non-pressure chronic ulcer of other part of right foot with fat layer exposed: Secondary | ICD-10-CM

## 2016-05-12 DIAGNOSIS — M869 Osteomyelitis, unspecified: Secondary | ICD-10-CM

## 2016-05-18 DIAGNOSIS — I872 Venous insufficiency (chronic) (peripheral): Secondary | ICD-10-CM | POA: Diagnosis not present

## 2016-05-18 DIAGNOSIS — I509 Heart failure, unspecified: Secondary | ICD-10-CM | POA: Diagnosis not present

## 2016-05-18 DIAGNOSIS — E1140 Type 2 diabetes mellitus with diabetic neuropathy, unspecified: Secondary | ICD-10-CM | POA: Diagnosis not present

## 2016-05-18 DIAGNOSIS — L97512 Non-pressure chronic ulcer of other part of right foot with fat layer exposed: Secondary | ICD-10-CM | POA: Diagnosis not present

## 2016-05-18 DIAGNOSIS — S81812A Laceration without foreign body, left lower leg, initial encounter: Secondary | ICD-10-CM | POA: Diagnosis not present

## 2016-05-18 DIAGNOSIS — J449 Chronic obstructive pulmonary disease, unspecified: Secondary | ICD-10-CM | POA: Diagnosis not present

## 2016-05-18 DIAGNOSIS — E11621 Type 2 diabetes mellitus with foot ulcer: Secondary | ICD-10-CM | POA: Diagnosis not present

## 2016-05-18 DIAGNOSIS — M1990 Unspecified osteoarthritis, unspecified site: Secondary | ICD-10-CM | POA: Diagnosis not present

## 2016-05-18 DIAGNOSIS — Z87891 Personal history of nicotine dependence: Secondary | ICD-10-CM | POA: Diagnosis not present

## 2016-05-22 ENCOUNTER — Ambulatory Visit (HOSPITAL_COMMUNITY)
Admission: RE | Admit: 2016-05-22 | Discharge: 2016-05-22 | Disposition: A | Discharge status: Home / Self Care | Payer: Medicare Other | Source: Ambulatory Visit | Attending: Surgery | Admitting: Surgery

## 2016-05-22 DIAGNOSIS — L97519 Non-pressure chronic ulcer of other part of right foot with unspecified severity: Secondary | ICD-10-CM | POA: Diagnosis not present

## 2016-05-22 DIAGNOSIS — M868X7 Other osteomyelitis, ankle and foot: Secondary | ICD-10-CM | POA: Insufficient documentation

## 2016-05-22 DIAGNOSIS — L97529 Non-pressure chronic ulcer of other part of left foot with unspecified severity: Secondary | ICD-10-CM | POA: Diagnosis not present

## 2016-05-22 DIAGNOSIS — L97512 Non-pressure chronic ulcer of other part of right foot with fat layer exposed: Secondary | ICD-10-CM

## 2016-05-22 MED ORDER — GADOBENATE DIMEGLUMINE 529 MG/ML IV SOLN
20.0000 mL | Freq: Once | INTRAVENOUS | Status: AC | PRN
  Administered 2016-05-22: 20 mL via INTRAVENOUS

## 2016-05-25 DIAGNOSIS — I509 Heart failure, unspecified: Secondary | ICD-10-CM | POA: Diagnosis not present

## 2016-05-25 DIAGNOSIS — M86371 Chronic multifocal osteomyelitis, right ankle and foot: Secondary | ICD-10-CM | POA: Diagnosis not present

## 2016-05-25 DIAGNOSIS — M1990 Unspecified osteoarthritis, unspecified site: Secondary | ICD-10-CM | POA: Diagnosis not present

## 2016-05-25 DIAGNOSIS — L97512 Non-pressure chronic ulcer of other part of right foot with fat layer exposed: Secondary | ICD-10-CM | POA: Diagnosis not present

## 2016-05-25 DIAGNOSIS — J449 Chronic obstructive pulmonary disease, unspecified: Secondary | ICD-10-CM | POA: Diagnosis not present

## 2016-05-25 DIAGNOSIS — S81812A Laceration without foreign body, left lower leg, initial encounter: Secondary | ICD-10-CM | POA: Diagnosis not present

## 2016-05-25 DIAGNOSIS — Z87891 Personal history of nicotine dependence: Secondary | ICD-10-CM | POA: Diagnosis not present

## 2016-05-25 DIAGNOSIS — I872 Venous insufficiency (chronic) (peripheral): Secondary | ICD-10-CM | POA: Diagnosis not present

## 2016-05-25 DIAGNOSIS — E11621 Type 2 diabetes mellitus with foot ulcer: Secondary | ICD-10-CM | POA: Diagnosis not present

## 2016-05-25 DIAGNOSIS — E1140 Type 2 diabetes mellitus with diabetic neuropathy, unspecified: Secondary | ICD-10-CM | POA: Diagnosis not present

## 2016-06-01 DIAGNOSIS — I872 Venous insufficiency (chronic) (peripheral): Secondary | ICD-10-CM | POA: Diagnosis not present

## 2016-06-01 DIAGNOSIS — E11621 Type 2 diabetes mellitus with foot ulcer: Secondary | ICD-10-CM | POA: Diagnosis not present

## 2016-06-01 DIAGNOSIS — I890 Lymphedema, not elsewhere classified: Secondary | ICD-10-CM | POA: Diagnosis not present

## 2016-06-01 DIAGNOSIS — Z87891 Personal history of nicotine dependence: Secondary | ICD-10-CM | POA: Diagnosis not present

## 2016-06-01 DIAGNOSIS — I509 Heart failure, unspecified: Secondary | ICD-10-CM | POA: Diagnosis not present

## 2016-06-01 DIAGNOSIS — L97512 Non-pressure chronic ulcer of other part of right foot with fat layer exposed: Secondary | ICD-10-CM | POA: Diagnosis not present

## 2016-06-01 DIAGNOSIS — S81812A Laceration without foreign body, left lower leg, initial encounter: Secondary | ICD-10-CM | POA: Diagnosis not present

## 2016-06-01 DIAGNOSIS — M1990 Unspecified osteoarthritis, unspecified site: Secondary | ICD-10-CM | POA: Diagnosis not present

## 2016-06-01 DIAGNOSIS — M86371 Chronic multifocal osteomyelitis, right ankle and foot: Secondary | ICD-10-CM | POA: Diagnosis not present

## 2016-06-01 DIAGNOSIS — E1140 Type 2 diabetes mellitus with diabetic neuropathy, unspecified: Secondary | ICD-10-CM | POA: Diagnosis not present

## 2016-06-01 DIAGNOSIS — J449 Chronic obstructive pulmonary disease, unspecified: Secondary | ICD-10-CM | POA: Diagnosis not present

## 2016-06-02 DIAGNOSIS — I890 Lymphedema, not elsewhere classified: Secondary | ICD-10-CM | POA: Diagnosis not present

## 2016-06-03 ENCOUNTER — Other Ambulatory Visit: Payer: Self-pay | Admitting: Infectious Disease

## 2016-06-03 DIAGNOSIS — B20 Human immunodeficiency virus [HIV] disease: Secondary | ICD-10-CM

## 2016-06-04 ENCOUNTER — Emergency Department (HOSPITAL_COMMUNITY): Payer: Medicare Other

## 2016-06-04 ENCOUNTER — Encounter (HOSPITAL_COMMUNITY): Payer: Self-pay | Admitting: Emergency Medicine

## 2016-06-04 ENCOUNTER — Inpatient Hospital Stay (HOSPITAL_COMMUNITY)
Admission: EM | Admit: 2016-06-04 | Discharge: 2016-06-10 | DRG: 616 | Disposition: A | Discharge status: Home Health | Payer: Medicare Other | Attending: Internal Medicine | Admitting: Internal Medicine

## 2016-06-04 DIAGNOSIS — R002 Palpitations: Secondary | ICD-10-CM

## 2016-06-04 DIAGNOSIS — L0390 Cellulitis, unspecified: Secondary | ICD-10-CM | POA: Diagnosis present

## 2016-06-04 DIAGNOSIS — J849 Interstitial pulmonary disease, unspecified: Secondary | ICD-10-CM | POA: Diagnosis not present

## 2016-06-04 DIAGNOSIS — Z21 Asymptomatic human immunodeficiency virus [HIV] infection status: Secondary | ICD-10-CM | POA: Diagnosis not present

## 2016-06-04 DIAGNOSIS — D899 Disorder involving the immune mechanism, unspecified: Secondary | ICD-10-CM | POA: Diagnosis present

## 2016-06-04 DIAGNOSIS — F330 Major depressive disorder, recurrent, mild: Secondary | ICD-10-CM

## 2016-06-04 DIAGNOSIS — Z823 Family history of stroke: Secondary | ICD-10-CM

## 2016-06-04 DIAGNOSIS — G4700 Insomnia, unspecified: Secondary | ICD-10-CM | POA: Diagnosis present

## 2016-06-04 DIAGNOSIS — E1142 Type 2 diabetes mellitus with diabetic polyneuropathy: Secondary | ICD-10-CM | POA: Diagnosis not present

## 2016-06-04 DIAGNOSIS — K219 Gastro-esophageal reflux disease without esophagitis: Secondary | ICD-10-CM | POA: Diagnosis present

## 2016-06-04 DIAGNOSIS — M869 Osteomyelitis, unspecified: Secondary | ICD-10-CM | POA: Diagnosis not present

## 2016-06-04 DIAGNOSIS — M86271 Subacute osteomyelitis, right ankle and foot: Secondary | ICD-10-CM

## 2016-06-04 DIAGNOSIS — F329 Major depressive disorder, single episode, unspecified: Secondary | ICD-10-CM | POA: Diagnosis present

## 2016-06-04 DIAGNOSIS — M86171 Other acute osteomyelitis, right ankle and foot: Secondary | ICD-10-CM | POA: Diagnosis present

## 2016-06-04 DIAGNOSIS — E1169 Type 2 diabetes mellitus with other specified complication: Secondary | ICD-10-CM | POA: Diagnosis not present

## 2016-06-04 DIAGNOSIS — E11621 Type 2 diabetes mellitus with foot ulcer: Secondary | ICD-10-CM | POA: Diagnosis not present

## 2016-06-04 DIAGNOSIS — S91301A Unspecified open wound, right foot, initial encounter: Secondary | ICD-10-CM | POA: Diagnosis not present

## 2016-06-04 DIAGNOSIS — L03115 Cellulitis of right lower limb: Secondary | ICD-10-CM | POA: Diagnosis not present

## 2016-06-04 DIAGNOSIS — F4310 Post-traumatic stress disorder, unspecified: Secondary | ICD-10-CM | POA: Diagnosis present

## 2016-06-04 DIAGNOSIS — R05 Cough: Secondary | ICD-10-CM | POA: Diagnosis not present

## 2016-06-04 DIAGNOSIS — L97509 Non-pressure chronic ulcer of other part of unspecified foot with unspecified severity: Secondary | ICD-10-CM

## 2016-06-04 DIAGNOSIS — M7989 Other specified soft tissue disorders: Secondary | ICD-10-CM | POA: Diagnosis not present

## 2016-06-04 DIAGNOSIS — Q87418 Marfan's syndrome with other cardiovascular manifestations: Secondary | ICD-10-CM | POA: Diagnosis not present

## 2016-06-04 DIAGNOSIS — B20 Human immunodeficiency virus [HIV] disease: Secondary | ICD-10-CM | POA: Diagnosis present

## 2016-06-04 DIAGNOSIS — B181 Chronic viral hepatitis B without delta-agent: Secondary | ICD-10-CM | POA: Diagnosis present

## 2016-06-04 DIAGNOSIS — R7881 Bacteremia: Secondary | ICD-10-CM | POA: Diagnosis not present

## 2016-06-04 DIAGNOSIS — B9561 Methicillin susceptible Staphylococcus aureus infection as the cause of diseases classified elsewhere: Secondary | ICD-10-CM | POA: Diagnosis present

## 2016-06-04 DIAGNOSIS — Z7982 Long term (current) use of aspirin: Secondary | ICD-10-CM | POA: Diagnosis not present

## 2016-06-04 DIAGNOSIS — K7460 Unspecified cirrhosis of liver: Secondary | ICD-10-CM | POA: Diagnosis present

## 2016-06-04 DIAGNOSIS — F331 Major depressive disorder, recurrent, moderate: Secondary | ICD-10-CM | POA: Diagnosis not present

## 2016-06-04 DIAGNOSIS — Z8249 Family history of ischemic heart disease and other diseases of the circulatory system: Secondary | ICD-10-CM | POA: Diagnosis not present

## 2016-06-04 DIAGNOSIS — J441 Chronic obstructive pulmonary disease with (acute) exacerbation: Secondary | ICD-10-CM | POA: Diagnosis not present

## 2016-06-04 DIAGNOSIS — I472 Ventricular tachycardia, unspecified: Secondary | ICD-10-CM

## 2016-06-04 DIAGNOSIS — Z808 Family history of malignant neoplasm of other organs or systems: Secondary | ICD-10-CM | POA: Diagnosis not present

## 2016-06-04 DIAGNOSIS — L97511 Non-pressure chronic ulcer of other part of right foot limited to breakdown of skin: Secondary | ICD-10-CM | POA: Diagnosis not present

## 2016-06-04 DIAGNOSIS — F17210 Nicotine dependence, cigarettes, uncomplicated: Secondary | ICD-10-CM | POA: Diagnosis present

## 2016-06-04 DIAGNOSIS — L97529 Non-pressure chronic ulcer of other part of left foot with unspecified severity: Secondary | ICD-10-CM

## 2016-06-04 DIAGNOSIS — F32A Depression, unspecified: Secondary | ICD-10-CM | POA: Diagnosis present

## 2016-06-04 DIAGNOSIS — L97519 Non-pressure chronic ulcer of other part of right foot with unspecified severity: Secondary | ICD-10-CM | POA: Diagnosis present

## 2016-06-04 DIAGNOSIS — Z89421 Acquired absence of other right toe(s): Secondary | ICD-10-CM | POA: Diagnosis not present

## 2016-06-04 DIAGNOSIS — L97514 Non-pressure chronic ulcer of other part of right foot with necrosis of bone: Secondary | ICD-10-CM | POA: Diagnosis not present

## 2016-06-04 DIAGNOSIS — Z79899 Other long term (current) drug therapy: Secondary | ICD-10-CM

## 2016-06-04 DIAGNOSIS — E1140 Type 2 diabetes mellitus with diabetic neuropathy, unspecified: Secondary | ICD-10-CM | POA: Diagnosis not present

## 2016-06-04 DIAGNOSIS — Z6826 Body mass index (BMI) 26.0-26.9, adult: Secondary | ICD-10-CM

## 2016-06-04 DIAGNOSIS — I872 Venous insufficiency (chronic) (peripheral): Secondary | ICD-10-CM | POA: Diagnosis present

## 2016-06-04 DIAGNOSIS — M86671 Other chronic osteomyelitis, right ankle and foot: Secondary | ICD-10-CM | POA: Diagnosis present

## 2016-06-04 DIAGNOSIS — E785 Hyperlipidemia, unspecified: Secondary | ICD-10-CM | POA: Diagnosis present

## 2016-06-04 DIAGNOSIS — I4581 Long QT syndrome: Secondary | ICD-10-CM | POA: Diagnosis not present

## 2016-06-04 DIAGNOSIS — J449 Chronic obstructive pulmonary disease, unspecified: Secondary | ICD-10-CM | POA: Diagnosis not present

## 2016-06-04 DIAGNOSIS — I340 Nonrheumatic mitral (valve) insufficiency: Secondary | ICD-10-CM | POA: Diagnosis present

## 2016-06-04 DIAGNOSIS — M8610 Other acute osteomyelitis, unspecified site: Secondary | ICD-10-CM | POA: Diagnosis not present

## 2016-06-04 DIAGNOSIS — E440 Moderate protein-calorie malnutrition: Secondary | ICD-10-CM | POA: Diagnosis present

## 2016-06-04 DIAGNOSIS — M86179 Other acute osteomyelitis, unspecified ankle and foot: Secondary | ICD-10-CM | POA: Diagnosis not present

## 2016-06-04 DIAGNOSIS — Z89511 Acquired absence of right leg below knee: Secondary | ICD-10-CM | POA: Diagnosis not present

## 2016-06-04 DIAGNOSIS — Z818 Family history of other mental and behavioral disorders: Secondary | ICD-10-CM

## 2016-06-04 LAB — CBC WITH DIFFERENTIAL/PLATELET
Basophils Absolute: 0 10*3/uL (ref 0.0–0.1)
Basophils Relative: 0 %
EOS ABS: 0 10*3/uL (ref 0.0–0.7)
Eosinophils Relative: 0 %
HEMATOCRIT: 36.9 % — AB (ref 39.0–52.0)
HEMOGLOBIN: 12.2 g/dL — AB (ref 13.0–17.0)
LYMPHS ABS: 0.8 10*3/uL (ref 0.7–4.0)
LYMPHS PCT: 9 %
MCH: 28.6 pg (ref 26.0–34.0)
MCHC: 33.1 g/dL (ref 30.0–36.0)
MCV: 86.4 fL (ref 78.0–100.0)
MONOS PCT: 5 %
Monocytes Absolute: 0.4 10*3/uL (ref 0.1–1.0)
NEUTROS ABS: 7.1 10*3/uL (ref 1.7–7.7)
NEUTROS PCT: 86 %
Platelets: 202 10*3/uL (ref 150–400)
RBC: 4.27 MIL/uL (ref 4.22–5.81)
RDW: 14.5 % (ref 11.5–15.5)
WBC: 8.3 10*3/uL (ref 4.0–10.5)

## 2016-06-04 LAB — C-REACTIVE PROTEIN: CRP: 15.6 mg/dL — AB (ref <1.0)

## 2016-06-04 LAB — URINE MICROSCOPIC-ADD ON

## 2016-06-04 LAB — PREALBUMIN: PREALBUMIN: 8.4 mg/dL — AB (ref 18–38)

## 2016-06-04 LAB — URINALYSIS, ROUTINE W REFLEX MICROSCOPIC
Glucose, UA: NEGATIVE mg/dL
Ketones, ur: NEGATIVE mg/dL
Leukocytes, UA: NEGATIVE
Nitrite: NEGATIVE
PH: 6 (ref 5.0–8.0)
Protein, ur: 30 mg/dL — AB
SPECIFIC GRAVITY, URINE: 1.025 (ref 1.005–1.030)

## 2016-06-04 LAB — COMPREHENSIVE METABOLIC PANEL WITH GFR
ALT: 12 U/L — ABNORMAL LOW (ref 17–63)
AST: 17 U/L (ref 15–41)
Albumin: 3.7 g/dL (ref 3.5–5.0)
Alkaline Phosphatase: 94 U/L (ref 38–126)
Anion gap: 11 (ref 5–15)
BUN: 16 mg/dL (ref 6–20)
CO2: 22 mmol/L (ref 22–32)
Calcium: 9.4 mg/dL (ref 8.9–10.3)
Chloride: 99 mmol/L — ABNORMAL LOW (ref 101–111)
Creatinine, Ser: 1 mg/dL (ref 0.61–1.24)
GFR calc Af Amer: >60 mL/min
GFR calc non Af Amer: >60 mL/min
Glucose, Bld: 124 mg/dL — ABNORMAL HIGH (ref 65–99)
Potassium: 3.7 mmol/L (ref 3.5–5.1)
Sodium: 132 mmol/L — ABNORMAL LOW (ref 135–145)
Total Bilirubin: 1.3 mg/dL — ABNORMAL HIGH (ref 0.3–1.2)
Total Protein: 8.4 g/dL — ABNORMAL HIGH (ref 6.5–8.1)

## 2016-06-04 LAB — I-STAT CG4 LACTIC ACID, ED
LACTIC ACID, VENOUS: 1.8 mmol/L (ref 0.5–1.9)
LACTIC ACID, VENOUS: 0.85 mmol/L (ref 0.5–1.9)

## 2016-06-04 LAB — GLUCOSE, CAPILLARY: Glucose-Capillary: 74 mg/dL (ref 65–99)

## 2016-06-04 LAB — SEDIMENTATION RATE: SED RATE: 96 mm/h — AB (ref 0–16)

## 2016-06-04 MED ORDER — MORPHINE SULFATE ER 40 MG PO CP24: 40.0000 mg | ORAL_CAPSULE | Freq: Two times a day (BID) | ORAL | Status: DC

## 2016-06-04 MED ORDER — VANCOMYCIN HCL 10 G IV SOLR
1250.0000 mg | Freq: Two times a day (BID) | INTRAVENOUS | Status: DC
  Administered 2016-06-05 – 2016-06-06 (×2): 1250 mg via INTRAVENOUS
  Filled 2016-06-04: qty 1000
  Filled 2016-06-04 (×3): qty 1250

## 2016-06-04 MED ORDER — SODIUM CHLORIDE 0.9 % IV SOLN
2000.0000 mg | Freq: Once | INTRAVENOUS | Status: AC
  Administered 2016-06-04: 2000 mg via INTRAVENOUS
  Filled 2016-06-04: qty 1000

## 2016-06-04 MED ORDER — INSULIN ASPART 100 UNIT/ML ~~LOC~~ SOLN
0.0000 [IU] | Freq: Three times a day (TID) | SUBCUTANEOUS | Status: DC
  Administered 2016-06-07: 2 [IU] via SUBCUTANEOUS

## 2016-06-04 MED ORDER — ENOXAPARIN SODIUM 40 MG/0.4ML ~~LOC~~ SOLN
40.0000 mg | SUBCUTANEOUS | Status: DC
  Administered 2016-06-05 – 2016-06-09 (×5): 40 mg via SUBCUTANEOUS
  Filled 2016-06-04 (×5): qty 0.4

## 2016-06-04 MED ORDER — NICOTINE 21 MG/24HR TD PT24
21.0000 mg | MEDICATED_PATCH | Freq: Every day | TRANSDERMAL | Status: DC
  Administered 2016-06-05 – 2016-06-10 (×6): 21 mg via TRANSDERMAL
  Filled 2016-06-04 (×6): qty 1

## 2016-06-04 MED ORDER — GUAIFENESIN ER 600 MG PO TB12
600.0000 mg | ORAL_TABLET | Freq: Two times a day (BID) | ORAL | Status: DC
  Administered 2016-06-04 – 2016-06-10 (×12): 600 mg via ORAL
  Filled 2016-06-04 (×13): qty 1

## 2016-06-04 MED ORDER — ASPIRIN 81 MG PO CHEW
81.0000 mg | CHEWABLE_TABLET | Freq: Every day | ORAL | Status: DC
  Administered 2016-06-05 – 2016-06-10 (×6): 81 mg via ORAL
  Filled 2016-06-04 (×6): qty 1

## 2016-06-04 MED ORDER — ENOXAPARIN SODIUM 100 MG/ML ~~LOC~~ SOLN
1.0000 mg/kg | Freq: Once | SUBCUTANEOUS | Status: AC
  Administered 2016-06-04: 95 mg via SUBCUTANEOUS
  Filled 2016-06-04: qty 1

## 2016-06-04 MED ORDER — HYDROCODONE-ACETAMINOPHEN 5-325 MG PO TABS
1.0000 | ORAL_TABLET | ORAL | Status: DC | PRN
  Administered 2016-06-09: 2 via ORAL
  Filled 2016-06-04: qty 2

## 2016-06-04 MED ORDER — CLONAZEPAM 1 MG PO TABS
1.0000 mg | ORAL_TABLET | Freq: Every day | ORAL | Status: DC
  Administered 2016-06-04 – 2016-06-08 (×5): 1 mg via ORAL
  Filled 2016-06-04 (×5): qty 1

## 2016-06-04 MED ORDER — ENSURE ENLIVE PO LIQD
237.0000 mL | Freq: Two times a day (BID) | ORAL | Status: DC
  Administered 2016-06-05 – 2016-06-10 (×7): 237 mL via ORAL

## 2016-06-04 MED ORDER — DOLUTEGRAVIR SODIUM 50 MG PO TABS
50.0000 mg | ORAL_TABLET | Freq: Every day | ORAL | Status: DC
  Administered 2016-06-04 – 2016-06-10 (×7): 50 mg via ORAL
  Filled 2016-06-04 (×7): qty 1

## 2016-06-04 MED ORDER — MORPHINE SULFATE ER 30 MG PO TBCR
30.0000 mg | EXTENDED_RELEASE_TABLET | Freq: Two times a day (BID) | ORAL | Status: DC
  Administered 2016-06-04 – 2016-06-10 (×13): 30 mg via ORAL
  Filled 2016-06-04 (×13): qty 1

## 2016-06-04 MED ORDER — ACETAMINOPHEN 650 MG RE SUPP: 650.0000 mg | Freq: Four times a day (QID) | RECTAL | Status: DC | PRN

## 2016-06-04 MED ORDER — IPRATROPIUM-ALBUTEROL 0.5-2.5 (3) MG/3ML IN SOLN: 3.0000 mL | Freq: Four times a day (QID) | RESPIRATORY_TRACT | Status: DC

## 2016-06-04 MED ORDER — EMTRICITABINE-TENOFOVIR AF 200-25 MG PO TABS
1.0000 | ORAL_TABLET | Freq: Every day | ORAL | Status: DC
  Administered 2016-06-04 – 2016-06-10 (×7): 1 via ORAL
  Filled 2016-06-04 (×7): qty 1

## 2016-06-04 MED ORDER — ALBUTEROL SULFATE (2.5 MG/3ML) 0.083% IN NEBU
2.5000 mg | INHALATION_SOLUTION | RESPIRATORY_TRACT | Status: DC | PRN
  Administered 2016-06-04 – 2016-06-08 (×2): 2.5 mg via RESPIRATORY_TRACT
  Filled 2016-06-04 (×2): qty 3

## 2016-06-04 MED ORDER — INSULIN ASPART 100 UNIT/ML ~~LOC~~ SOLN: 0.0000 [IU] | Freq: Every day | SUBCUTANEOUS | Status: DC

## 2016-06-04 MED ORDER — ONDANSETRON HCL 4 MG PO TABS: 4.0000 mg | ORAL_TABLET | Freq: Four times a day (QID) | ORAL | Status: DC | PRN

## 2016-06-04 MED ORDER — ALPRAZOLAM 0.5 MG PO TABS
0.5000 mg | ORAL_TABLET | Freq: Three times a day (TID) | ORAL | Status: DC | PRN
  Administered 2016-06-04 – 2016-06-10 (×9): 0.5 mg via ORAL
  Filled 2016-06-04 (×10): qty 1

## 2016-06-04 MED ORDER — SODIUM CHLORIDE 0.9% FLUSH
3.0000 mL | Freq: Two times a day (BID) | INTRAVENOUS | Status: DC
  Administered 2016-06-04 – 2016-06-08 (×5): 3 mL via INTRAVENOUS

## 2016-06-04 MED ORDER — SODIUM CHLORIDE 0.9 % IV SOLN
INTRAVENOUS | Status: DC
  Administered 2016-06-04: 23:00:00 via INTRAVENOUS

## 2016-06-04 MED ORDER — NORTRIPTYLINE HCL 25 MG PO CAPS
150.0000 mg | ORAL_CAPSULE | Freq: Every day | ORAL | Status: DC
  Administered 2016-06-04 – 2016-06-05 (×2): 150 mg via ORAL
  Filled 2016-06-04 (×2): qty 6

## 2016-06-04 MED ORDER — ACETAMINOPHEN 325 MG PO TABS: 650.0000 mg | ORAL_TABLET | Freq: Four times a day (QID) | ORAL | Status: DC | PRN

## 2016-06-04 MED ORDER — ONDANSETRON HCL 4 MG/2ML IJ SOLN
4.0000 mg | Freq: Four times a day (QID) | INTRAMUSCULAR | Status: DC | PRN
Start: 2016-06-04 — End: 2016-06-06

## 2016-06-04 MED ORDER — SPIRONOLACTONE 25 MG PO TABS
25.0000 mg | ORAL_TABLET | Freq: Every day | ORAL | Status: DC
  Administered 2016-06-05 – 2016-06-10 (×6): 25 mg via ORAL
  Filled 2016-06-04 (×6): qty 1

## 2016-06-04 NOTE — Progress Notes (Signed)
Pharmacy Antibiotic Note  [name] is a 55 y.o. male admitted on 06/04/2016 with osteomyelitis  Pharmacy has been consulted for vancomycin dosing.  Plan: Vancomycin 2000mg  IV x 1 then 1250mg  IV q12h Follow renal function, cultures, clinical course     Temp (24hrs), Avg:98.2 F (36.8 C), Min:98.2 F (36.8 C), Max:98.2 F (36.8 C)   Recent Labs Lab 06/04/16 1640 06/04/16 1646  WBC 8.3  --   CREATININE 1.00  --   LATICACIDVEN  --  1.80    CrCl cannot be calculated (Unknown ideal weight.).    Allergies  Allergen Reactions  . Tetracycline Hcl Swelling    Lips, facial swelling    Antimicrobials this admission: 12/2 vanc >>   Thank you for allowing pharmacy to be a part of this patient's care.  [name] RPh 06/04/2016, 6:07 PM Pager [phone]

## 2016-06-04 NOTE — H&P (Addendum)
Johnathan LarsenGary W Hope OZD:[id number] DOB: May 28, 1961 DOA: 06/04/2016     PCP: Johny SaxJeffrey Hatcher, MD   Outpatient Specialists: ID  Patient coming from:   home Lives alone,        Chief Complaint: right foot ulcer  HPI: [name] is a 55 y.o. 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 tach with normal cardiac catheterization Hepatitis B, neuropathy Presented with worsening nonhealing right foot ulcer  recently had MRI done that showed osteomyelitis he was told that he needs to present to an emergency department for IV antibiotics patient does describe some erythema warmth and redness around the ankle cough and going up to thigh he reports fevers and chills and overall feeling unwell.  She has long-standing right foot ulcers followed by wound care and podiatry been treated with outpatient Septra DS 2 weeks and Silvadene cream as well as Betadine soaks. He has been seen 2 weeks ago by infectious disease Dr. Ninetta LightsHatcher last HIV count was less than 20 in August. Last CD4 count was 120 in August. Currently being actively treated by ID. He reports fevers every morning only partially improved with tylenol. Patient was worried he is becoming septic and came to ER because he could not be seen by ID until few weeks later. Denies any melena no easy bruising or bleeding no hx of DVT. Reports some cough and wheezing related to hx of COPD Regarding pertinent Chronic problems: Regarding history of HIV load by Dr. Ninetta LightsHatcher Last echogram February 2017 showed EF 45% with septal hypokinesis evidence of conduction delay mild LVH  IN ER:  Temp (24hrs), Avg:98.2 F (36.8 C), Min:98.2 F (36.8 C), Max:98.2 F (36.8 C)    RR 18 HR 95 Bp 110/62  Na 132 Cr 1.00 Hgb 12.2  CXR COPD but not acute   Patient was worried about having a blood clot.  Following Medications were ordered in ER: Medications  enoxaparin (LOVENOX) injection 95 mg (not administered)    vancomycin (VANCOCIN) 2,000 mg in sodium chloride 0.9 % 500 mL IVPB (not administered)  vancomycin (VANCOCIN) 1,250 mg in sodium chloride 0.9 % 250 mL IVPB (not administered)      Hospitalist was called for admission for cellulitis and osteoposis  Review of Systems:    Pertinent positives include:Fevers, chills, fatigue, palpitations, lightheadedness. Leg swelling and redness  Constitutional:  No weight loss, night sweats, weight loss  HEENT:  No headaches, Difficulty swallowing,Tooth/dental problems,Sore throat,  No sneezing, itching, ear ache, nasal congestion, post nasal drip,  Cardio-vascular:  No chest pain, Orthopnea, PND, anasarca, dizziness, palpitations.no Bilateral lower extremity swelling  GI:  No heartburn, indigestion, abdominal pain, nausea, vomiting, diarrhea, change in bowel habits, loss of appetite, melena, blood in stool, hematemesis Resp:  no shortness of breath at rest. No dyspnea on exertion, No excess mucus, no productive cough, No non-productive cough, No coughing up of blood.No change in color of mucus.No wheezing. Skin:  no rash or lesions. No jaundice GU:  no dysuria, change in color of urine, no urgency or frequency. No straining to urinate.  No flank pain.  Musculoskeletal:  No joint pain or no joint swelling. No decreased range of motion. No back pain.  Psych:  No change in mood or affect. No depression or anxiety. No memory loss.  Neuro: no localizing neurological complaints, no tingling, no weakness, no double vision, no gait abnormality, no slurred speech, no confusion  As per HPI otherwise  10 point review of systems negative.   Past Medical History: Past Medical History:  Diagnosis Date  . Cholesteatoma   . Chronic hepatitis B (HCC)   . 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 COPD (chronic obstructive pulmonary disease) (HCC)   . COPD exacerbation (HCC) 12/08/2014  . Depression   . Folliculitis barbae  12/08/2014  . GERD (gastroesophageal reflux disease) 06/09/2015  . HIV (human immunodeficiency virus infection) (HCC)   . ILD (interstitial lung disease) (HCC) 12/08/2014  . Marfan syndrome    Dr Ninetta LightsHatcher follows  . Mitral valve prolapse   . Rhinosinusitis 12/08/2014  . Venous insufficiency (chronic) (peripheral)   . VT (ventricular tachycardia) (HCC)    Past Surgical History:  Procedure Laterality Date  . APPENDECTOMY    . cholesteotoma removal    . LEFT HEART CATHETERIZATION WITH CORONARY ANGIOGRAM [name] 09/24/2013   Procedure: LEFT HEART CATHETERIZATION WITH CORONARY ANGIOGRAM;  Surgeon: Marykay Lexavid W Harding, MD;  [facility];  Service: Cardiovascular;  Laterality: [name];  . repair of perforated colon       Social History:  Ambulatory  independently      reports that he has been smoking Cigarettes.  He has a 8.75 pack-year smoking history. He has never used smokeless tobacco. He reports that he does not drink alcohol or use drugs.  Allergies:   Allergies  Allergen Reactions  . Tetracycline Hcl Swelling    Lips, facial swelling       Family History:   Family History  Problem Relation Age of Onset  . Cancer Mother   . Hypertension Mother   . Stroke Sister   . Hypertension Sister   . Aneurysm Sister     Medications: Prior to Admission medications   Medication Sig Start Date End Date Taking? Authorizing Provider  ALPRAZolam Prudy Feeler(XANAX) 0.5 MG tablet Take 0.5 mg by mouth 3 (three) times daily as needed for anxiety.   Yes Historical Provider, MD  aspirin 81 MG chewable tablet Chew 81 mg by mouth daily.   Yes Historical Provider, MD  clonazePAM (KLONOPIN) 1 MG tablet Take 1 mg by mouth at bedtime.   Yes Historical Provider, MD  DESCOVY 200-25 MG tablet TAKE 1 TABLET BY MOUTH DAILY. 06/03/16  Yes Ginnie SmartJeffrey C Hatcher, MD  furosemide (LASIX) 40 MG tablet TAKE 1/2 TABLET (20 MG TOTAL) BY BY MOUTH EVERY MORNING. 04/27/16  Yes Ginnie SmartJeffrey C Hatcher, MD  Morphine Sulfate 40 MG CP24 Take 40  mg by mouth 2 (two) times daily. 05/10/16  Yes Judyann Munsonynthia Snider, MD  Multiple Vitamins-Minerals (MULTIVITAMIN WITH MINERALS) tablet Take 1 tablet by mouth daily.   Yes Historical Provider, MD  nortriptyline (PAMELOR) 75 MG capsule Take 150 mg by mouth at bedtime.   Yes Historical Provider, MD  Oil of Oregano 1500 MG CAPS Take 1 capsule by mouth 3 (three) times a week.   Yes Historical Provider, MD  promethazine (PHENERGAN) 25 MG tablet TAKE 1 TABLET BY MOUTH EVERY 8 HOURS AS NEEDED. 06/08/15  Yes Ginnie SmartJeffrey C Hatcher, MD  spironolactone (ALDACTONE) 25 MG tablet TAKE 1 TABLET BY MOUTH ONCE DAILY. 05/05/16  Yes Ginnie SmartJeffrey C Hatcher, MD  TIVICAY 50 MG tablet TAKE 1 TABLET BY MOUTH DAILY. 06/03/16  Yes Ginnie SmartJeffrey C Hatcher, MD  silver sulfADIAZINE (SILVADENE) 1 % cream Apply 1 application topically daily. Patient not taking: Reported on 06/04/2016 03/09/16   Carrington Clampichard C Tuchman, DPM  sulfamethoxazole-trimethoprim (BACTRIM DS,SEPTRA DS) 800-160 MG tablet TAKE 1 TABLET BY MOUTH  3 TIMES A WEEK. Patient not taking: Reported on 06/04/2016 11/18/15   Ginnie Smart, MD    Physical Exam: Patient Vitals for the past 24 hrs:  BP Temp Temp src Pulse Resp SpO2 Weight  06/04/16 1825 - - - - - - 93.1 kg (205 lb 3 oz)  06/04/16 1819 110/62 - - 95 18 94 % -  06/04/16 1606 139/82 98.2 F (36.8 C) Oral 107 20 98 % -    1. General:  in No Acute distress 2. Psychological: Alert and   Oriented 3. Head/ENT:     Dry Mucous Membranes                          Head Non traumatic, neck supple                           Poor Dentition 4. SKIN:   decreased Skin turgor,  Skin clean Dry right foot swallen with draining ulcer      5. Heart: Regular rate and rhythm no  Murmur, Rub or gallop 6. Lungs:   no wheezesDistant breath sounds some crackles   7. Abdomen: Soft,  non-tender, Non distended 8. Lower extremities: no clubbing, cyanosis, or edema 9. Neurologically Grossly intact, moving all 4 extremities equally   10. MSK: Normal  range of motion   body mass index is 25.99 kg/m.  Labs on Admission:   Labs on Admission: I have personally reviewed following labs and imaging studies  CBC:  Recent Labs Lab 06/04/16 1640  WBC 8.3  NEUTROABS 7.1  HGB 12.2*  HCT 36.9*  MCV 86.4  PLT 202   Basic Metabolic Panel:  Recent Labs Lab 06/04/16 1640  NA 132*  K 3.7  CL 99*  CO2 22  GLUCOSE 124*  BUN 16  CREATININE 1.00  CALCIUM 9.4   GFR: Estimated Creatinine Clearance: 97.3 mL/min (by C-G formula based on SCr of 1 mg/dL). Liver Function Tests:  Recent Labs Lab 06/04/16 1640  AST 17  ALT 12*  ALKPHOS 94  BILITOT 1.3*  PROT 8.4*  ALBUMIN 3.7   No results for input(s): LIPASE, AMYLASE in the last 168 hours. No results for input(s): AMMONIA in the last 168 hours. Coagulation Profile: No results for input(s): INR, PROTIME in the last 168 hours. Cardiac Enzymes: No results for input(s): CKTOTAL, CKMB, CKMBINDEX, TROPONINI in the last 168 hours. BNP (last 3 results) No results for input(s): PROBNP in the last 8760 hours. HbA1C: No results for input(s): HGBA1C in the last 72 hours. CBG: No results for input(s): GLUCAP in the last 168 hours. Lipid Profile: No results for input(s): CHOL, HDL, LDLCALC, TRIG, CHOLHDL, LDLDIRECT in the last 72 hours. Thyroid Function Tests: No results for input(s): TSH, T4TOTAL, FREET4, T3FREE, THYROIDAB in the last 72 hours. Anemia Panel: No results for input(s): VITAMINB12, FOLATE, FERRITIN, TIBC, IRON, RETICCTPCT in the last 72 hours. Urine analysis:    Component Value Date/Time   COLORURINE AMBER (A) 06/04/2016 1655   APPEARANCEUR CLEAR 06/04/2016 1655   LABSPEC 1.025 06/04/2016 1655   PHURINE 6.0 06/04/2016 1655   GLUCOSEU NEGATIVE 06/04/2016 1655   HGBUR MODERATE (A) 06/04/2016 1655   BILIRUBINUR MODERATE (A) 06/04/2016 1655   KETONESUR NEGATIVE 06/04/2016 1655   PROTEINUR 30 (A) 06/04/2016 1655   UROBILINOGEN >8.0 (H) 01/23/2011 2032   NITRITE  NEGATIVE 06/04/2016 1655   LEUKOCYTESUR NEGATIVE 06/04/2016 1655   Sepsis Labs: @  LABRCNTIP(procalcitonin:4,lacticidven:4) )No results found for this or any previous visit (from the past 240 hour(s)).    UA  no evidence of UTI     Lab Results  Component Value Date   HGBA1C 5.5 03/25/2009    Estimated Creatinine Clearance: 97.3 mL/min (by C-G formula based on SCr of 1 mg/dL).  BNP (last 3 results) No results for input(s): PROBNP in the last 8760 hours.   ECG REPORT Not obtained  [facility]   06/04/16 1825  Weight: 93.1 kg (205 lb 3 oz)     Cultures:    Component Value Date/Time   SDES URINE, CLEAN CATCH 01/24/2011 1911   SPECREQUEST NONE 01/24/2011 1911   CULT STAPHYLOCOCCUS SPECIES (COAGULASE NEGATIVE) 01/24/2011 1911   REPTSTATUS 01/28/2011 FINAL 01/24/2011 1911     Radiological Exams on Admission: Dg Chest 2 View  Result Date: 06/04/2016 CLINICAL DATA:  55 year old male with history of cough and fever for the past 4 days. Possible sepsis. EXAM: CHEST  2 VIEW COMPARISON:  Chest x-ray 06/09/2015. FINDINGS: Emphysematous changes are again noted throughout the lungs bilaterally, most notable in the lung apices. No confluent consolidative airspace disease. Slight coarsening of interstitial markings throughout the mid to lower lungs bilaterally is chronic and similar to the prior examination. Bibasilar linear opacities favored to reflect areas of chronic scarring and/or subsegmental atelectasis. No pleural effusions. No evidence of pulmonary edema. Heart size is normal. The patient is rotated to the right on today's exam, resulting in distortion of the mediastinal contours and reduced diagnostic sensitivity and specificity for mediastinal pathology. IMPRESSION: 1. Chronic changes in the lungs suggestive of underlying COPD redemonstrated, as above. No definite radiographic evidence of acute cardiopulmonary disease. Electronically Signed   By: Trudie Reed M.D.   On:  06/04/2016 16:41   05/22/2016 3:38 PM   EXAM: MRI OF THE RIGHT FOREFOOT WITHOUT AND WITH CONTRAST  TECHNIQUE: Multiplanar, multisequence MR imaging was performed both before and after administration of intravenous contrast.  CONTRAST:  20mL MULTIHANCE GADOBENATE DIMEGLUMINE 529 MG/ML IV SOLN  COMPARISON:  None.  FINDINGS: Bones/Joint/Cartilage  Soft tissue ulcer overlying the fifth MTP joint along the plantar aspect. Underlying cortical erosion of the plantar aspect of the fifth metatarsal head with surrounding marrow edema and mild enhancement most consistent with osteomyelitis.  No other focal marrow signal abnormality. No other areas of bone destruction or periosteal reaction. No fracture or dislocation. Normal alignment. No joint effusion.  Ligaments  Collateral ligaments are intact.  Muscles and Tendons Flexor, peroneal and extensor compartment tendons are intact.  Soft tissue No fluid collection or hematoma.  No soft tissue mass.  IMPRESSION: 1. Soft tissue ulcer overlying the plantar aspect of the fifth MTP joint. Mild osteomyelitis of the plantar aspect of the fifth metatarsal head. No focal fluid collection to suggest an abscess.   Electronically Signed   By: Elige Ko   On: 05/23/2016 08:06   Chart has been reviewed    Assessment/Plan   56 y.o. 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 tach with normal cardiac catheterization being admitted for diabetic foot ulcer complicated by palpitations and history of COPD  Present on Admission: . Osteomyelitis (HCC) . Cellulitis/diabetic foot ulcer -continue with IV antibiotics  -admit per cellulitis protocol will   MRI showing evidence of osteomyelitis            Will obtain MRSA screening,       obtain blood  cultures       further antibiotic adjustment pending above results  - Please contact Dr. Lajoyce Corners on Monday to see  patient in consult  . AIDS (HCC) Will continue home medications check CD4 count . 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 Depression stable continue to monitor . ILD (interstitial lung disease) (HCC)/ COPD - we'll make sure her on DuoNeb and when necessary albuterol added Mucinex and flutter valve patient this point states he wishes to quit smoking . Type 2 diabetes mellitus with left diabetic foot ulcer (HCC) check hemoglobin A1c sliding scale currently diet-controlled Tobacco abuse discussed importance of quitting patient is interested this point we'll order nursing tobacco cessation protocol, nicotine patch Given leg swelling we'll check for DVT patient is very concerned given progressive leg edema most likely secondary to cellulitis but he is at risk for DVT formation given decreased mobility and smoking Palpitations we'll monitor on telemetry to evaluate for any arrhythmias. Patient in the past had episode of V. tach Other plan as per orders.  DVT prophylaxis:   Lovenox     Code Status:  FULL CODE  as per patient    Family Communication:   Family not  at  Bedside   Disposition Plan:     To home once workup is complete and patient is stable                              Consults called: none  Admission status:  inpatient       Level of care tele             I have spent a total of 54 min on this admission     Maurion Walkowiak 06/04/2016, 8:52 PM    [facility]  Pager [phone]   after 2 AM please page floor coverage PA If 7AM-7PM, please contact the day team taking care of the patient  [email or web address]  Password [email or web address]

## 2016-06-04 NOTE — ED Provider Notes (Signed)
[facility]-EMERGENCY DEPT Provider Note   CSN: [id number] Arrival date & time: 06/04/16  1555     History   Chief Complaint Chief Complaint  Patient presents with  . Wound Infection    HPI [name] is a 55 y.o. male.  HPI Patient presents with concern of a nonhealing wound on his right foot. Would begin sometime ago, but over the past few days or weeks has become worse, it is focally on the right lateral calcaneus. There is new surrounding erythema, edema throughout the ankle, calf, ascending towards the thigh. Patient also complains of fever, chills, generalized discomfort Patient has intermittently been on Bactrim, has been seen his wound care specialist. Patient tried to arrange follow up with his infectious disease doctor, was able to do so, is here for evaluation. Notably, the patient has multiple medical issues including HIV, hepatitis B, neuropathy in the foot, and he denies any pain in the foot. He also has mild ongoing cough. Patient had MRI last week with positive result for osteomyelitis in the foot.    Past Medical History:  Diagnosis Date  . Cholesteatoma   . Chronic hepatitis B (HCC)   . 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 COPD (chronic obstructive pulmonary disease) (HCC)   . COPD exacerbation (HCC) 12/08/2014  . Depression   . Folliculitis barbae 12/08/2014  . GERD (gastroesophageal reflux disease) 06/09/2015  . HIV (human immunodeficiency virus infection) (HCC)   . ILD (interstitial lung disease) (HCC) 12/08/2014  . Marfan syndrome    Dr Ninetta LightsHatcher follows  . Mitral valve prolapse   . Rhinosinusitis 12/08/2014  . Venous insufficiency (chronic) (peripheral)   . VT (ventricular tachycardia) [facility](HCC)     Patient Active Problem List   Diagnosis Date Noted  . AIDS (HCC) 06/09/2015  . 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 GERD (gastroesophageal reflux disease) 06/09/2015  . COPD exacerbation  (HCC) 12/08/2014  . Folliculitis barbae 12/08/2014  . Rhinosinusitis 12/08/2014  . ILD (interstitial lung disease) (HCC) 12/08/2014  . Neuropathic foot ulcer (HCC) 10/29/2014  . PTSD (post-traumatic stress disorder) 09/23/2013  . Bilateral lower extremity edema- hx venous RFA 9/14 09/23/2013  . VT (ventricular tachycardia) (HCC) 09/23/2013  . Falls 06/12/2013  . Reflux 05/07/2012  . Mental status change 02/02/2011  . TOBACCO ABUSE 01/25/2010  . Varicose veins of lower extremities with ulcer (HCC) 08/11/2009  . Type 2 diabetes mellitus with left diabetic foot ulcer (HCC) 01/21/2008  . DERMATITIS NOS 01/21/2008  . Chest pressure - when in VT 12/18/2007  . DIARRHEA 11/22/2007  . Type 2 diabetes, controlled, with peripheral neuropathy (HCC) 04/13/2006  . Depression 04/13/2006  . NEUROPATHY 04/13/2006  . COPD with acute bronchitis (HCC) 04/13/2006  . 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 HEPATITIS B, HX OF 04/13/2006    Past Surgical History:  Procedure Laterality Date  . APPENDECTOMY    . cholesteotoma removal    . LEFT HEART CATHETERIZATION WITH CORONARY ANGIOGRAM [name] 09/24/2013   Procedure: LEFT HEART CATHETERIZATION WITH CORONARY ANGIOGRAM;  Surgeon: Marykay Lexavid W Harding, MD;  [facility];  Service: Cardiovascular;  Laterality: [name];  . repair of perforated colon         Home Medications    Prior to Admission medications   Medication Sig Start Date End Date Taking? Authorizing Provider  ALPRAZolam Prudy Feeler(XANAX) 0.5 MG tablet Take 0.5 mg by mouth 3 (three) times daily  as needed for anxiety.   Yes Historical Provider, MD  aspirin 81 MG chewable tablet Chew 81 mg by mouth daily.   Yes Historical Provider, MD  clonazePAM (KLONOPIN) 1 MG tablet Take 1 mg by mouth at bedtime.   Yes Historical Provider, MD  DESCOVY 200-25 MG tablet TAKE 1 TABLET BY MOUTH DAILY. 06/03/16  Yes Ginnie SmartJeffrey C Hatcher, MD  furosemide (LASIX) 40 MG tablet TAKE 1/2 TABLET  (20 MG TOTAL) BY BY MOUTH EVERY MORNING. 04/27/16  Yes Ginnie SmartJeffrey C Hatcher, MD  Morphine Sulfate 40 MG CP24 Take 40 mg by mouth 2 (two) times daily. 05/10/16  Yes Judyann Munsonynthia Snider, MD  Multiple Vitamins-Minerals (MULTIVITAMIN WITH MINERALS) tablet Take 1 tablet by mouth daily.   Yes Historical Provider, MD  nortriptyline (PAMELOR) 75 MG capsule Take 150 mg by mouth at bedtime.   Yes Historical Provider, MD  Oil of Oregano 1500 MG CAPS Take 1 capsule by mouth 3 (three) times a week.   Yes Historical Provider, MD  promethazine (PHENERGAN) 25 MG tablet TAKE 1 TABLET BY MOUTH EVERY 8 HOURS AS NEEDED. 06/08/15  Yes Ginnie SmartJeffrey C Hatcher, MD  spironolactone (ALDACTONE) 25 MG tablet TAKE 1 TABLET BY MOUTH ONCE DAILY. 05/05/16  Yes Ginnie SmartJeffrey C Hatcher, MD  TIVICAY 50 MG tablet TAKE 1 TABLET BY MOUTH DAILY. 06/03/16  Yes Ginnie SmartJeffrey C Hatcher, MD  silver sulfADIAZINE (SILVADENE) 1 % cream Apply 1 application topically daily. Patient not taking: Reported on 06/04/2016 03/09/16   Carrington Clampichard C Tuchman, DPM  sulfamethoxazole-trimethoprim (BACTRIM DS,SEPTRA DS) 800-160 MG tablet TAKE 1 TABLET BY MOUTH 3 TIMES A WEEK. Patient not taking: Reported on 06/04/2016 11/18/15   Ginnie SmartJeffrey C Hatcher, MD    Family History Family History  Problem Relation Age of Onset  . Cancer Mother   . Hypertension Mother   . Stroke Sister   . Hypertension Sister   . Aneurysm Sister     Social History Social History  Substance Use Topics  . Smoking status: Current Some Day Smoker    Packs/day: 0.25    Years: 35.00    Types: Cigarettes  . Smokeless tobacco: Never Used     Comment: trying to cut back  . Alcohol use No     Allergies   Tetracycline hcl   Review of Systems Review of Systems  Constitutional:       Per HPI, otherwise negative  HENT:       Per HPI, otherwise negative  Respiratory:       Per HPI, otherwise negative  Cardiovascular:       Per HPI, otherwise negative  Gastrointestinal: Negative for vomiting.  Endocrine:        Negative aside from HPI  Genitourinary:       Neg aside from HPI   Musculoskeletal:       Per HPI, otherwise negative  Skin: Positive for color change, pallor and wound.  Allergic/Immunologic: Positive for immunocompromised state.  Neurological:       Neuropathy, unchanged     Physical Exam Updated Vital Signs BP 139/82 (BP Location: Left Arm)   Pulse 107   Temp 98.2 F (36.8 C) (Oral)   Resp 20   SpO2 98%   Physical Exam  Constitutional: He is oriented to person, place, and time. He has a sickly appearance.  HENT:  Head: Normocephalic and atraumatic.  Eyes: Conjunctivae and EOM are normal.  Cardiovascular: Normal rate and regular rhythm.   Pulmonary/Chest: Effort normal. No stridor. No respiratory distress.  Abdominal:  He exhibits no distension.  Musculoskeletal: He exhibits no edema.  Neurological: He is alert and oriented to person, place, and time.  Skin: Skin is warm and dry.     Psychiatric: He has a normal mood and affect.  Nursing note and vitals reviewed.    ED Treatments / Results  Labs (all labs ordered are listed, but only abnormal results are displayed) Labs Reviewed  COMPREHENSIVE METABOLIC PANEL - Abnormal; Notable for the following:       Result Value   Sodium 132 (*)    Chloride 99 (*)    Glucose, Bld 124 (*)    Total Protein 8.4 (*)    ALT 12 (*)    Total Bilirubin 1.3 (*)    All other components within normal limits  CBC WITH DIFFERENTIAL/PLATELET - Abnormal; Notable for the following:    Hemoglobin 12.2 (*)    HCT 36.9 (*)    All other components within normal limits  URINALYSIS, ROUTINE W REFLEX MICROSCOPIC (NOT AT [facility]) - Abnormal; Notable for the following:    Color, Urine AMBER (*)    Hgb urine dipstick MODERATE (*)    Bilirubin Urine MODERATE (*)    Protein, ur 30 (*)    All other components within normal limits  URINE MICROSCOPIC-ADD ON - Abnormal; Notable for the following:    Squamous Epithelial / LPF 0-5 (*)    Bacteria, UA  RARE (*)    All other components within normal limits  I-STAT CG4 LACTIC ACID, ED    EKG  EKG Interpretation None       Radiology Dg Chest 2 View  Result Date: 06/04/2016 CLINICAL DATA:  55 year old male with history of cough and fever for the past 4 days. Possible sepsis. EXAM: CHEST  2 VIEW COMPARISON:  Chest x-ray 06/09/2015. FINDINGS: Emphysematous changes are again noted throughout the lungs bilaterally, most notable in the lung apices. No confluent consolidative airspace disease. Slight coarsening of interstitial markings throughout the mid to lower lungs bilaterally is chronic and similar to the prior examination. Bibasilar linear opacities favored to reflect areas of chronic scarring and/or subsegmental atelectasis. No pleural effusions. No evidence of pulmonary edema. Heart size is normal. The patient is rotated to the right on today's exam, resulting in distortion of the mediastinal contours and reduced diagnostic sensitivity and specificity for mediastinal pathology. IMPRESSION: 1. Chronic changes in the lungs suggestive of underlying COPD redemonstrated, as above. No definite radiographic evidence of acute cardiopulmonary disease. Electronically Signed   By: Trudie Reed M.D.   On: 06/04/2016 16:41    Procedures Procedures (including critical care time)  Medications Ordered in ED Medications  enoxaparin (LOVENOX) injection 1 mg/kg (not administered)     Initial Impression / Assessment and Plan / ED Course  I have reviewed the triage vital signs and the nursing notes.  Pertinent labs & imaging results that were available during my care of the patient were reviewed by me and considered in my medical decision making (see chart for details).    Final Clinical Impressions(s) / ED Diagnoses  Immediate Patient presents with new swelling and erythema about a previously present right heel lesion, with MRI evidence of osteomyelitis. With concern for progressive cellulitis as  well as ongoing osteomyelitis patient was tried on vancomycin. Given the swelling, patient also received empiric Lovenox pending vascular ultrasound tomorrow for concern of DVT.    Gerhard Munch, MD 06/04/16 [phone]

## 2016-06-04 NOTE — ED Triage Notes (Signed)
Per pt, states right foot heel wound-goes to wound center and was told to get IV antibiotics-multiple complaints

## 2016-06-04 NOTE — ED Notes (Signed)
Hospitalist at bedside 

## 2016-06-04 NOTE — ED Notes (Addendum)
Per MD keep patient in ED until hospitalist comes to see patient informed RN.

## 2016-06-05 ENCOUNTER — Encounter (HOSPITAL_COMMUNITY): Payer: Self-pay | Admitting: Infectious Diseases

## 2016-06-05 ENCOUNTER — Inpatient Hospital Stay (HOSPITAL_COMMUNITY): Payer: Medicare Other

## 2016-06-05 DIAGNOSIS — Z823 Family history of stroke: Secondary | ICD-10-CM

## 2016-06-05 DIAGNOSIS — F17210 Nicotine dependence, cigarettes, uncomplicated: Secondary | ICD-10-CM

## 2016-06-05 DIAGNOSIS — M86171 Other acute osteomyelitis, right ankle and foot: Secondary | ICD-10-CM | POA: Diagnosis present

## 2016-06-05 DIAGNOSIS — E1142 Type 2 diabetes mellitus with diabetic polyneuropathy: Secondary | ICD-10-CM

## 2016-06-05 DIAGNOSIS — L97509 Non-pressure chronic ulcer of other part of unspecified foot with unspecified severity: Secondary | ICD-10-CM

## 2016-06-05 DIAGNOSIS — Z809 Family history of malignant neoplasm, unspecified: Secondary | ICD-10-CM

## 2016-06-05 DIAGNOSIS — Z8249 Family history of ischemic heart disease and other diseases of the circulatory system: Secondary | ICD-10-CM

## 2016-06-05 DIAGNOSIS — E11621 Type 2 diabetes mellitus with foot ulcer: Secondary | ICD-10-CM

## 2016-06-05 DIAGNOSIS — E13621 Other specified diabetes mellitus with foot ulcer: Secondary | ICD-10-CM

## 2016-06-05 DIAGNOSIS — L97511 Non-pressure chronic ulcer of other part of right foot limited to breakdown of skin: Secondary | ICD-10-CM

## 2016-06-05 DIAGNOSIS — M7989 Other specified soft tissue disorders: Secondary | ICD-10-CM

## 2016-06-05 DIAGNOSIS — E1140 Type 2 diabetes mellitus with diabetic neuropathy, unspecified: Secondary | ICD-10-CM

## 2016-06-05 DIAGNOSIS — L97519 Non-pressure chronic ulcer of other part of right foot with unspecified severity: Secondary | ICD-10-CM

## 2016-06-05 DIAGNOSIS — Z888 Allergy status to other drugs, medicaments and biological substances status: Secondary | ICD-10-CM

## 2016-06-05 LAB — COMPREHENSIVE METABOLIC PANEL
ALT: 10 U/L — AB (ref 17–63)
ANION GAP: 7 (ref 5–15)
AST: 11 U/L — AB (ref 15–41)
Albumin: 3 g/dL — ABNORMAL LOW (ref 3.5–5.0)
Alkaline Phosphatase: 74 U/L (ref 38–126)
BUN: 15 mg/dL (ref 6–20)
CALCIUM: 8.7 mg/dL — AB (ref 8.9–10.3)
CO2: 25 mmol/L (ref 22–32)
Chloride: 101 mmol/L (ref 101–111)
Creatinine, Ser: 0.9 mg/dL (ref 0.61–1.24)
GFR calc non Af Amer: >60 mL/min (ref >60)
GLUCOSE: 87 mg/dL (ref 65–99)
POTASSIUM: 3.5 mmol/L (ref 3.5–5.1)
SODIUM: 133 mmol/L — AB (ref 135–145)
TOTAL PROTEIN: 6.9 g/dL (ref 6.5–8.1)
Total Bilirubin: 1 mg/dL (ref 0.3–1.2)

## 2016-06-05 LAB — CBC
HEMATOCRIT: 32.8 % — AB (ref 39.0–52.0)
HEMOGLOBIN: 10.6 g/dL — AB (ref 13.0–17.0)
MCH: 28.4 pg (ref 26.0–34.0)
MCHC: 32.3 g/dL (ref 30.0–36.0)
MCV: 87.9 fL (ref 78.0–100.0)
Platelets: 152 10*3/uL (ref 150–400)
RBC: 3.73 MIL/uL — ABNORMAL LOW (ref 4.22–5.81)
RDW: 14.7 % (ref 11.5–15.5)
WBC: 4 10*3/uL (ref 4.0–10.5)

## 2016-06-05 LAB — BLOOD CULTURE ID PANEL (REFLEXED)
ACINETOBACTER BAUMANNII: NOT DETECTED
CANDIDA GLABRATA: NOT DETECTED
CANDIDA KRUSEI: NOT DETECTED
CANDIDA PARAPSILOSIS: NOT DETECTED
Candida albicans: NOT DETECTED
Candida tropicalis: NOT DETECTED
ENTEROBACTER CLOACAE COMPLEX: NOT DETECTED
ESCHERICHIA COLI: NOT DETECTED
Enterobacteriaceae species: NOT DETECTED
Enterococcus species: NOT DETECTED
Haemophilus influenzae: NOT DETECTED
KLEBSIELLA OXYTOCA: NOT DETECTED
Klebsiella pneumoniae: NOT DETECTED
LISTERIA MONOCYTOGENES: NOT DETECTED
Methicillin resistance: NOT DETECTED
Neisseria meningitidis: NOT DETECTED
PROTEUS SPECIES: NOT DETECTED
PSEUDOMONAS AERUGINOSA: NOT DETECTED
SERRATIA MARCESCENS: NOT DETECTED
STAPHYLOCOCCUS AUREUS BCID: DETECTED — AB
STREPTOCOCCUS AGALACTIAE: NOT DETECTED
STREPTOCOCCUS PNEUMONIAE: NOT DETECTED
Staphylococcus species: DETECTED — AB
Streptococcus pyogenes: NOT DETECTED
Streptococcus species: NOT DETECTED

## 2016-06-05 LAB — GLUCOSE, CAPILLARY
GLUCOSE-CAPILLARY: 70 mg/dL (ref 65–99)
Glucose-Capillary: 108 mg/dL — ABNORMAL HIGH (ref 65–99)
Glucose-Capillary: 115 mg/dL — ABNORMAL HIGH (ref 65–99)

## 2016-06-05 LAB — HIV-1 RNA QUANT-NO REFLEX-BLD
HIV 1 RNA Quant: 30 copies/mL
LOG10 HIV-1 RNA: 1.477 {Log_copies}/mL

## 2016-06-05 LAB — TSH: TSH: 0.903 u[IU]/mL (ref 0.350–4.500)

## 2016-06-05 LAB — MRSA PCR SCREENING: MRSA BY PCR: NEGATIVE

## 2016-06-05 LAB — PHOSPHORUS: Phosphorus: 3.3 mg/dL (ref 2.5–4.6)

## 2016-06-05 LAB — MAGNESIUM: Magnesium: 1.7 mg/dL (ref 1.7–2.4)

## 2016-06-05 MED ORDER — METRONIDAZOLE 500 MG PO TABS
500.0000 mg | ORAL_TABLET | Freq: Three times a day (TID) | ORAL | Status: DC
  Administered 2016-06-05 – 2016-06-06 (×4): 500 mg via ORAL
  Filled 2016-06-05 (×4): qty 1

## 2016-06-05 MED ORDER — CHLORHEXIDINE GLUCONATE 4 % EX LIQD
60.0000 mL | Freq: Once | CUTANEOUS | Status: DC
  Filled 2016-06-05: qty 60

## 2016-06-05 MED ORDER — DEXTROSE 5 % IV SOLN
2.0000 g | INTRAVENOUS | Status: DC
  Administered 2016-06-05: 2 g via INTRAVENOUS
  Filled 2016-06-05 (×2): qty 2

## 2016-06-05 MED ORDER — FLUTICASONE FUROATE-VILANTEROL 200-25 MCG/INH IN AEPB
1.0000 | INHALATION_SPRAY | Freq: Every day | RESPIRATORY_TRACT | Status: DC
  Administered 2016-06-06: 1 via RESPIRATORY_TRACT
  Filled 2016-06-05: qty 28

## 2016-06-05 MED ORDER — POVIDONE-IODINE 10 % EX SWAB
2.0000 "application " | Freq: Once | CUTANEOUS | Status: AC
  Administered 2016-06-06: 2 via TOPICAL

## 2016-06-05 MED ORDER — TAMSULOSIN HCL 0.4 MG PO CAPS: 0.4000 mg | ORAL_CAPSULE | Freq: Every day | ORAL | Status: DC

## 2016-06-05 MED ORDER — MAGNESIUM SULFATE 4 GM/100ML IV SOLN
4.0000 g | Freq: Once | INTRAVENOUS | Status: AC
  Administered 2016-06-05: 4 g via INTRAVENOUS
  Filled 2016-06-05: qty 100

## 2016-06-05 MED ORDER — HYDROCOD POLST-CPM POLST ER 10-8 MG/5ML PO SUER
5.0000 mL | Freq: Once | ORAL | Status: AC
  Administered 2016-06-05: 5 mL via ORAL
  Filled 2016-06-05: qty 5

## 2016-06-05 MED ORDER — IPRATROPIUM-ALBUTEROL 0.5-2.5 (3) MG/3ML IN SOLN
3.0000 mL | Freq: Three times a day (TID) | RESPIRATORY_TRACT | Status: DC
  Administered 2016-06-05 – 2016-06-07 (×6): 3 mL via RESPIRATORY_TRACT
  Filled 2016-06-05 (×6): qty 3

## 2016-06-05 NOTE — Consult Note (Signed)
[name] for Infectious Disease  Date of Admission:  06/04/2016  Date of Consult:  06/05/2016  Reason for Consult: R foot ulcer Referring Physician: Thompson  Impression/Recommendation R Foot Ulcer Appreciate hospitalists care of pt Appreciate ortho eval Would check vascular studies Would add ceftriaxone and flagyl Plan for 2 weeks of anbx post amputation tomorrow  HIV+ Would continue his descovy, tivicay Check CD4 and HIV RNA  Diet Controlled DM Check A1C  Available as needed  Thank you so much for this interesting consult,  Thank you for your good care of [name] (pager) [phone] [email or web address]  [name] is an 55 y.o. male.  HPI: 55 yo M with neuropathy, HIV/AIDS, DM that is diet controlled, suspected Marfan's syndrome, and COPD. He was started on complera 2012.  Then changed to DTGV/Descovy 06-2015. Was in hospital 09-2013 with sustained VT. His cath was normal.  His last Echo was 08-2015:  - Left ventricle: Difficult acoustic windoes LVEF is approximately   45% with septal hypokinesis consistent with conduction delay;   mild lateral hypokinesis; basal inferior / posterior hypokinesis.   The cavity size was normal. Wall thickness was increased in a   pattern of mild LVH. - Mitral valve: Calcified annulus. Mildly thickened leaflets .   There was mild regurgitation.  I saw him in clinic 04-2016 and he was doing well with his ART. He was being followed at that time for a R foot ulcer. He has been on po bactrim for this.  He came to ED on 12-2 with erythema and warmth in his R ankle as well as fevers. He had ultrasound done which did not show DVT.   He had a MRI done on 11-19 which showed:  1. Soft tissue ulcer overlying the plantar aspect of the fifth MTP joint. Mild osteomyelitis of the plantar aspect of the fifth metatarsal head. No focal fluid collection to suggest an abscess.  Past Medical History:  Diagnosis Date    . Cholesteatoma   . Chronic hepatitis B ([place])   . 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 COPD (chronic obstructive pulmonary disease) ([place])   . COPD exacerbation ([place]) 12/08/2014  . Depression   . Folliculitis barbae 03/06/9029  . GERD (gastroesophageal reflux disease) 06/09/2015  . HIV (human immunodeficiency virus infection) ([place])   . ILD (interstitial lung disease) ([place]) 12/08/2014  . Marfan syndrome    Dr Johnnye Sima follows  . Mitral valve prolapse   . Rhinosinusitis 12/08/2014  . Venous insufficiency (chronic) (peripheral)   . VT (ventricular tachycardia) ([place])     Past Surgical History:  Procedure Laterality Date  . APPENDECTOMY    . cholesteotoma removal    . LEFT HEART CATHETERIZATION WITH CORONARY ANGIOGRAM [name] 09/24/2013   Procedure: LEFT HEART CATHETERIZATION WITH CORONARY ANGIOGRAM;  Surgeon: Leonie Man, MD;  Location[facility] CATH LAB;  Service: Cardiovascular;  Laterality: [name];  . repair of perforated colon       Allergies  Allergen Reactions  . Tetracycline Hcl Swelling    Lips, facial swelling    Medications:  Scheduled: . aspirin  81 mg Oral Daily  . clonazePAM  1 mg Oral QHS  . dolutegravir  50 mg Oral Daily  . emtricitabine-tenofovir AF  1 tablet Oral Daily  . enoxaparin (LOVENOX) injection  40 mg Subcutaneous Q24H  . feeding supplement (ENSURE ENLIVE)  237 mL Oral BID BM  . guaiFENesin  600 mg Oral BID  .  insulin aspart  0-5 Units Subcutaneous QHS  . insulin aspart  0-9 Units Subcutaneous TID WC  . ipratropium-albuterol  3 mL Nebulization TID  . morphine  30 mg Oral Q12H  . nicotine  21 mg Transdermal Daily  . nortriptyline  150 mg Oral QHS  . sodium chloride flush  3 mL Intravenous Q12H  . spironolactone  25 mg Oral Daily  . vancomycin  1,250 mg Intravenous Q12H    Abtx:  Anti-infectives    Start     Dose/Rate Route Frequency Ordered Stop   06/05/16 2000  vancomycin (VANCOCIN) 1,250 mg in sodium chloride 0.9 % 250  mL IVPB     1,250 mg 166.7 mL/hr over 90 Minutes Intravenous Every 12 hours 06/04/16 1810     06/04/16 2230  emtricitabine-tenofovir AF (DESCOVY) 200-25 MG per tablet 1 tablet     1 tablet Oral Daily 06/04/16 2218     06/04/16 2230  dolutegravir (TIVICAY) tablet 50 mg     50 mg Oral Daily 06/04/16 2218     06/04/16 1815  vancomycin (VANCOCIN) 2,000 mg in sodium chloride 0.9 % 500 mL IVPB     2,000 mg 250 mL/hr over 120 Minutes Intravenous  Once 06/04/16 1804 06/04/16 2136      Total days of antibiotics: 1 vancomycin          Social History:  reports that he has been smoking Cigarettes.  He has a 8.75 pack-year smoking history. He has never used smokeless tobacco. He reports that he does not drink alcohol or use drugs.  Family History  Problem Relation Age of Onset  . Cancer Mother   . Hypertension Mother   . Stroke Sister   . Hypertension Sister   . Aneurysm Sister     General ROS: sister recently died of CVA, ?cirrhosis. Eating well. bearing wt on foot. denies missed ART. see hpi.   Blood pressure 110/78, pulse 76, temperature 97.4 F (36.3 C), temperature source Oral, resp. rate 18, height _0  (1.88 m), weight 93 kg (205 lb), SpO2 93 %. General appearance: alert, cooperative and no distress Eyes: negative findings: conjunctivae and sclerae normal and pupils equal, round, reactive to light and accomodation Throat: normal findings: no thrush or ulcers and abnormal findings: dry Neck: no adenopathy and supple, symmetrical, trachea midline Lungs: clear to auscultation bilaterally Heart: regular rate and rhythm and systolic murmur: holosystolic 5/6, blowing throughout the precordium Abdomen: normal findings: bowel sounds normal and soft, non-tender Extremities: edema 2+ RLE and ulcer as depicted in photo in hospitalists note. no d/c. mild tenderness. mild erythema at ankle. warmth vs L foot. pulses 3+ LLE DP, 2+ RLE DP   Results for orders placed or performed during the  hospital encounter of 06/04/16 (from the past 48 hour(s))  Sedimentation rate     Status: Abnormal   Collection Time: 06/04/16  4:29 PM  Result Value Ref Range   Sed Rate 96 (H) 0 - 16 mm/hr  C-reactive protein     Status: Abnormal   Collection Time: 06/04/16  4:29 PM  Result Value Ref Range   CRP 15.6 (H) <1.0 mg/dL    Comment: Performed at [facility]  Prealbumin     Status: Abnormal   Collection Time: 06/04/16  4:29 PM  Result Value Ref Range   Prealbumin 8.4 (L) 18 - 38 mg/dL    Comment: Performed at [facility]  Comprehensive metabolic panel     Status: Abnormal   Collection  Time: 06/04/16  4:40 PM  Result Value Ref Range   Sodium 132 (L) 135 - 145 mmol/L   Potassium 3.7 3.5 - 5.1 mmol/L   Chloride 99 (L) 101 - 111 mmol/L   CO2 22 22 - 32 mmol/L   Glucose, Bld 124 (H) 65 - 99 mg/dL   BUN 16 6 - 20 mg/dL   Creatinine, Ser 1.00 0.61 - 1.24 mg/dL   Calcium 9.4 8.9 - 10.3 mg/dL   Total Protein 8.4 (H) 6.5 - 8.1 g/dL   Albumin 3.7 3.5 - 5.0 g/dL   AST 17 15 - 41 U/L   ALT 12 (L) 17 - 63 U/L   Alkaline Phosphatase 94 38 - 126 U/L   Total Bilirubin 1.3 (H) 0.3 - 1.2 mg/dL   GFR calc non Af Amer >60 >60 mL/min   GFR calc Af Amer >60 >60 mL/min    Comment: (NOTE) The eGFR has been calculated using the CKD EPI equation. This calculation has not been validated in all clinical situations. eGFR's persistently <60 mL/min signify possible Chronic Kidney Disease.    Anion gap 11 5 - 15  CBC with Differential     Status: Abnormal   Collection Time: 06/04/16  4:40 PM  Result Value Ref Range   WBC 8.3 4.0 - 10.5 K/uL   RBC 4.27 4.22 - 5.81 MIL/uL   Hemoglobin 12.2 (L) 13.0 - 17.0 g/dL   HCT 36.9 (L) 39.0 - 52.0 %   MCV 86.4 78.0 - 100.0 fL   MCH 28.6 26.0 - 34.0 pg   MCHC 33.1 30.0 - 36.0 g/dL   RDW 14.5 11.5 - 15.5 %   Platelets 202 150 - 400 K/uL   Neutrophils Relative % 86 %   Neutro Abs 7.1 1.7 - 7.7 K/uL   Lymphocytes Relative 9 %   Lymphs Abs 0.8 0.7 -  4.0 K/uL   Monocytes Relative 5 %   Monocytes Absolute 0.4 0.1 - 1.0 K/uL   Eosinophils Relative 0 %   Eosinophils Absolute 0.0 0.0 - 0.7 K/uL   Basophils Relative 0 %   Basophils Absolute 0.0 0.0 - 0.1 K/uL  I-Stat CG4 Lactic Acid, ED     Status: None   Collection Time: 06/04/16  4:46 PM  Result Value Ref Range   Lactic Acid, Venous 1.80 0.5 - 1.9 mmol/L  Urinalysis, Routine w reflex microscopic     Status: Abnormal   Collection Time: 06/04/16  4:55 PM  Result Value Ref Range   Color, Urine AMBER (A) YELLOW    Comment: BIOCHEMICALS MAY BE AFFECTED BY COLOR   APPearance CLEAR CLEAR   Specific Gravity, Urine 1.025 1.005 - 1.030   pH 6.0 5.0 - 8.0   Glucose, UA NEGATIVE NEGATIVE mg/dL   Hgb urine dipstick MODERATE (A) NEGATIVE   Bilirubin Urine MODERATE (A) NEGATIVE   Ketones, ur NEGATIVE NEGATIVE mg/dL   Protein, ur 30 (A) NEGATIVE mg/dL   Nitrite NEGATIVE NEGATIVE   Leukocytes, UA NEGATIVE NEGATIVE  Urine microscopic-add on     Status: Abnormal   Collection Time: 06/04/16  4:55 PM  Result Value Ref Range   Squamous Epithelial / LPF 0-5 (A) NONE SEEN   WBC, UA 0-5 0 - 5 WBC/hpf   RBC / HPF 6-30 0 - 5 RBC/hpf   Bacteria, UA RARE (A) NONE SEEN  Culture, blood (Routine X 2) w Reflex to ID Panel     Status: None (Preliminary result)   Collection Time: 06/04/16  5:45 PM  Result Value Ref Range   Specimen Description BLOOD RIGHT ANTECUBITAL    Special Requests BOTTLES DRAWN AEROBIC AND ANAEROBIC 5ML    Culture PENDING    Report Status PENDING   I-Stat CG4 Lactic Acid, ED     Status: None   Collection Time: 06/04/16  8:24 PM  Result Value Ref Range   Lactic Acid, Venous 0.85 0.5 - 1.9 mmol/L  Glucose, capillary     Status: None   Collection Time: 06/04/16 10:14 PM  Result Value Ref Range   Glucose-Capillary 74 65 - 99 mg/dL  Magnesium     Status: None   Collection Time: 06/05/16  5:51 AM  Result Value Ref Range   Magnesium 1.7 1.7 - 2.4 mg/dL  Phosphorus     Status: None    Collection Time: 06/05/16  5:51 AM  Result Value Ref Range   Phosphorus 3.3 2.5 - 4.6 mg/dL  TSH     Status: None   Collection Time: 06/05/16  5:51 AM  Result Value Ref Range   TSH 0.903 0.350 - 4.500 uIU/mL    Comment: Performed by a 3rd Generation assay with a functional sensitivity of <=0.01 uIU/mL.  Comprehensive metabolic panel     Status: Abnormal   Collection Time: 06/05/16  5:51 AM  Result Value Ref Range   Sodium 133 (L) 135 - 145 mmol/L   Potassium 3.5 3.5 - 5.1 mmol/L   Chloride 101 101 - 111 mmol/L   CO2 25 22 - 32 mmol/L   Glucose, Bld 87 65 - 99 mg/dL   BUN 15 6 - 20 mg/dL   Creatinine, Ser 0.90 0.61 - 1.24 mg/dL   Calcium 8.7 (L) 8.9 - 10.3 mg/dL   Total Protein 6.9 6.5 - 8.1 g/dL   Albumin 3.0 (L) 3.5 - 5.0 g/dL   AST 11 (L) 15 - 41 U/L   ALT 10 (L) 17 - 63 U/L   Alkaline Phosphatase 74 38 - 126 U/L   Total Bilirubin 1.0 0.3 - 1.2 mg/dL   GFR calc non Af Amer >60 >60 mL/min   GFR calc Af Amer >60 >60 mL/min    Comment: (NOTE) The eGFR has been calculated using the CKD EPI equation. This calculation has not been validated in all clinical situations. eGFR's persistently <60 mL/min signify possible Chronic Kidney Disease.    Anion gap 7 5 - 15  CBC     Status: Abnormal   Collection Time: 06/05/16  5:51 AM  Result Value Ref Range   WBC 4.0 4.0 - 10.5 K/uL   RBC 3.73 (L) 4.22 - 5.81 MIL/uL   Hemoglobin 10.6 (L) 13.0 - 17.0 g/dL   HCT 32.8 (L) 39.0 - 52.0 %   MCV 87.9 78.0 - 100.0 fL   MCH 28.4 26.0 - 34.0 pg   MCHC 32.3 30.0 - 36.0 g/dL   RDW 14.7 11.5 - 15.5 %   Platelets 152 150 - 400 K/uL  MRSA PCR Screening     Status: None   Collection Time: 06/05/16  6:30 AM  Result Value Ref Range   MRSA by PCR NEGATIVE NEGATIVE    Comment:        The GeneXpert MRSA Assay (FDA approved for NASAL specimens only), is one component of a comprehensive MRSA colonization surveillance program. It is not intended to diagnose MRSA infection nor to guide or monitor  treatment for MRSA infections.   Glucose, capillary     Status: None  Collection Time: 06/05/16  7:19 AM  Result Value Ref Range   Glucose-Capillary 70 65 - 99 mg/dL  Glucose, capillary     Status: Abnormal   Collection Time: 06/05/16 11:41 AM  Result Value Ref Range   Glucose-Capillary 108 (H) 65 - 99 mg/dL      Component Value Date/Time   SDES BLOOD RIGHT ANTECUBITAL 06/04/2016 1745   SPECREQUEST BOTTLES DRAWN AEROBIC AND ANAEROBIC 5ML 06/04/2016 1745   CULT PENDING 06/04/2016 1745   REPTSTATUS PENDING 06/04/2016 1745   Dg Chest 2 View  Result Date: 06/04/2016 CLINICAL DATA:  55 year old male with history of cough and fever for the past 4 days. Possible sepsis. EXAM: CHEST  2 VIEW COMPARISON:  Chest x-ray 06/09/2015. FINDINGS: Emphysematous changes are again noted throughout the lungs bilaterally, most notable in the lung apices. No confluent consolidative airspace disease. Slight coarsening of interstitial markings throughout the mid to lower lungs bilaterally is chronic and similar to the prior examination. Bibasilar linear opacities favored to reflect areas of chronic scarring and/or subsegmental atelectasis. No pleural effusions. No evidence of pulmonary edema. Heart size is normal. The patient is rotated to the right on today's exam, resulting in distortion of the mediastinal contours and reduced diagnostic sensitivity and specificity for mediastinal pathology. IMPRESSION: 1. Chronic changes in the lungs suggestive of underlying COPD redemonstrated, as above. No definite radiographic evidence of acute cardiopulmonary disease. Electronically Signed   By: Vinnie Langton M.D.   On: 06/04/2016 16:41   Dg Foot Complete Right  Result Date: 06/05/2016 CLINICAL DATA:  Continued pain and foul odor from a nonhealing wound in the lateral aspect of the right foot at the fifth MTP joint. EXAM: RIGHT FOOT COMPLETE - 3+ VIEW COMPARISON:  05/06/2016. FINDINGS: Mildly progressive bone destruction  involving the fifth metatarsal head. There is also dorsal dislocation of the fifth toe at the level of the MTP joint. Associated soft tissue swelling. No soft tissue gas. Stable bone island in the second metatarsal. IMPRESSION: 1. Mildly progressive changes of osteomyelitis involving the fifth metatarsal head. 2. Dorsal dislocation of the fifth toe at the MTP joint. Electronically Signed   By: Claudie Revering M.D.   On: 06/05/2016 11:16   Recent Results (from the past 240 hour(s))  Culture, blood (Routine X 2) w Reflex to ID Panel     Status: None (Preliminary result)   Collection Time: 06/04/16  5:45 PM  Result Value Ref Range Status   Specimen Description BLOOD RIGHT ANTECUBITAL  Final   Special Requests BOTTLES DRAWN AEROBIC AND ANAEROBIC 5ML  Final   Culture PENDING  Incomplete   Report Status PENDING  Incomplete  MRSA PCR Screening     Status: None   Collection Time: 06/05/16  6:30 AM  Result Value Ref Range Status   MRSA by PCR NEGATIVE NEGATIVE Final    Comment:        The GeneXpert MRSA Assay (FDA approved for NASAL specimens only), is one component of a comprehensive MRSA colonization surveillance program. It is not intended to diagnose MRSA infection nor to guide or monitor treatment for MRSA infections.       06/05/2016, 1:37 PM     LOS: 1 day    Records and images were personally reviewed where available.

## 2016-06-05 NOTE — Progress Notes (Signed)
Central Telemetry called to report patient had 12 beats Vtach.  Patient asymptomatic, denies chest pain.  Notified physician.  No orders received.  Will continue to monitor and assess patient.

## 2016-06-05 NOTE — Consult Note (Addendum)
 ORTHOPAEDIC CONSULTATION  REQUESTING PHYSICIAN: Daniel V Thompson, MD  Chief Complaint: Osteomyelitis ulceration right foot fifth metatarsal head.  HPI: [name] is a 55 y.o. 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 Patient states that he has been followed by several months at the [place] wound center with Dr. Britto with serial debridement of the ulcer right fifth metatarsal head.  Past Medical History:  Diagnosis Date  . Cholesteatoma   . Chronic hepatitis B ([facility])   . 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 COPD (chronic obstructive pulmonary disease) ([facility])   . COPD exacerbation ([facility]) 12/08/2014  . Depression   . Folliculitis barbae 12/08/2014  . GERD (gastroesophageal reflux disease) 06/09/2015  . HIV (human immunodeficiency virus infection) ([facility])   . ILD (interstitial lung disease) ([facility]) 12/08/2014  . Marfan syndrome    Dr Hatcher follows  . Mitral valve prolapse   . Rhinosinusitis 12/08/2014  . Venous insufficiency (chronic) (peripheral)   . VT (ventricular tachycardia) ([facility])    Past Surgical History:  Procedure Laterality Date  . APPENDECTOMY    . cholesteotoma removal    . LEFT HEART CATHETERIZATION WITH CORONARY ANGIOGRAM [name] 09/24/2013   Procedure: LEFT HEART CATHETERIZATION WITH CORONARY ANGIOGRAM;  Surgeon: David W Harding, MD;  Location: [facility] CATH LAB;  Service: Cardiovascular;  Laterality: [name];  . repair of perforated colon     Social History   Social History  . Marital status: Single    Spouse name: [name]  . Number of children: [name]  . Years of education: [name]   Social History Main Topics  . Smoking status: Current Some Day Smoker    Packs/day: 0.25    Years: 35.00    Types: Cigarettes  . Smokeless tobacco: Never Used     Comment: trying to cut back  . Alcohol use No  . Drug use: No  . Sexual activity: No     Comment: pt. declined condoms   Other Topics Concern  . None    Social History Narrative  . None   Family History  Problem Relation Age of Onset  . Cancer Mother   . Hypertension Mother   . Stroke Sister   . Hypertension Sister   . Aneurysm Sister    - negative except otherwise stated in the family history section Allergies  Allergen Reactions  . Tetracycline Hcl Swelling    Lips, facial swelling   Prior to Admission medications   Medication Sig Start Date End Date Taking? Authorizing Provider  ALPRAZolam (XANAX) 0.5 MG tablet Take 0.5 mg by mouth 3 (three) times daily as needed for anxiety.   Yes Historical Provider, MD  aspirin 81 MG chewable tablet Chew 81 mg by mouth daily.   Yes Historical Provider, MD  clonazePAM (KLONOPIN) 1 MG tablet Take 1 mg by mouth at bedtime.   Yes Historical Provider, MD  DESCOVY 200-25 MG tablet TAKE 1 TABLET BY MOUTH DAILY. 06/03/16  Yes Jeffrey C Hatcher, MD  furosemide (LASIX) 40 MG tablet TAKE 1/2 TABLET (20 MG TOTAL) BY BY MOUTH EVERY MORNING. 04/27/16  Yes Jeffrey C Hatcher, MD  Morphine Sulfate 40 MG CP24 Take 40 mg by mouth 2 (two) times daily. 05/10/16  Yes Cynthia Snider, MD  Multiple Vitamins-Minerals (MULTIVITAMIN WITH MINERALS) tablet Take 1 tablet by mouth daily.   Yes Historical Provider, MD  nortriptyline (PAMELOR) 75 MG capsule Take 150 mg by mouth at bedtime.   Yes Historical   Provider, MD  Oil of Oregano 1500 MG CAPS Take 1 capsule by mouth 3 (three) times a week.   Yes Historical Provider, MD  promethazine (PHENERGAN) 25 MG tablet TAKE 1 TABLET BY MOUTH EVERY 8 HOURS AS NEEDED. 06/08/15  Yes Jeffrey C Hatcher, MD  spironolactone (ALDACTONE) 25 MG tablet TAKE 1 TABLET BY MOUTH ONCE DAILY. 05/05/16  Yes Jeffrey C Hatcher, MD  TIVICAY 50 MG tablet TAKE 1 TABLET BY MOUTH DAILY. 06/03/16  Yes Jeffrey C Hatcher, MD  silver sulfADIAZINE (SILVADENE) 1 % cream Apply 1 application topically daily. Patient not taking: Reported on 06/04/2016 03/09/16   Richard C Tuchman, DPM  sulfamethoxazole-trimethoprim  (BACTRIM DS,SEPTRA DS) 800-160 MG tablet TAKE 1 TABLET BY MOUTH 3 TIMES A WEEK. Patient not taking: Reported on 06/04/2016 11/18/15   Jeffrey C Hatcher, MD   Dg Chest 2 View  Result Date: 06/04/2016 CLINICAL DATA:  55-year-old male with history of cough and fever for the past 4 days. Possible sepsis. EXAM: CHEST  2 VIEW COMPARISON:  Chest x-ray 06/09/2015. FINDINGS: Emphysematous changes are again noted throughout the lungs bilaterally, most notable in the lung apices. No confluent consolidative airspace disease. Slight coarsening of interstitial markings throughout the mid to lower lungs bilaterally is chronic and similar to the prior examination. Bibasilar linear opacities favored to reflect areas of chronic scarring and/or subsegmental atelectasis. No pleural effusions. No evidence of pulmonary edema. Heart size is normal. The patient is rotated to the right on today's exam, resulting in distortion of the mediastinal contours and reduced diagnostic sensitivity and specificity for mediastinal pathology. IMPRESSION: 1. Chronic changes in the lungs suggestive of underlying COPD redemonstrated, as above. No definite radiographic evidence of acute cardiopulmonary disease. Electronically Signed   By: Daniel  Entrikin M.D.   On: 06/04/2016 16:41   - pertinent xrays, CT, MRI studies were reviewed and independently interpreted  Positive ROS: All other systems have been reviewed and were otherwise negative with the exception of those mentioned in the HPI and as above.  Physical Exam: General: Alert, no acute distress Psychiatric: Patient is competent for consent with normal mood and affect Lymphatic: No axillary or cervical lymphadenopathy Cardiovascular: No pedal edema Respiratory: No cyanosis, no use of accessory musculature GI: No organomegaly, abdomen is soft and non-tender  Skin: Examination patient has brawny skin color changes in both lower extremities consistent with venous insufficiency he states  he has had some venous ablation in the past. Patient has no open ulcers he has pitting edema in both legs. He does have a Wagner grade 3 ulcer beneath the fifth metatarsal head right foot with exposed necrotic bone.   Neurologic: Patient does not have protective sensation bilateral lower extremities.   MUSCULOSKELETAL:  Examination patient is alert oriented no adenopathy well-dressed normal affect, respiratory effort.  Patient has a strong dorsalis pedis pulse the Doppler shows triphasic flow. Patient's Doppler was negative for DVT. Examination of his foot he has exposed necrotic bone with Wagner grade 3 ulcer fifth metatarsal head. Previous MRI scan shows osteomyelitis.  Assessment: Assessment: Insensate neuropathy with Wagner grade 3 ulcer fifth metatarsal head right foot with chronic osteomyelitis fifth metatarsal head.  Of note patient states that he is not diabetic.   Patient's hemoglobin A1c is 5.5.   Plan: Plan: We'll plan for right foot fifth ray amputation tomorrow Monday 5 PM. Risks and benefits of surgery were discussed including persistent infection nonhealing the wound need for additional surgery. Patient states he understands and wishes to proceed at   this time. Anticipate he could be discharged to home postoperative once he is able to ambulate nonweightbearing right lower extremity.  Thank you for the consult and the opportunity to see [name]  Eun Vermeer, MD [facility] [phone] 11:00 AM     

## 2016-06-05 NOTE — Progress Notes (Signed)
PROGRESS NOTE    [name]  [name]:[id number] DOB: 1961/05/10 DOA: 06/04/2016 PCP: Johny SaxJeffrey Hatcher, MD    Brief Narrative:  55 y.o. 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 Patient presented with right foot ulcer with some cellulitis. Patient currently afebrile. Patient has been seen in consultation by Dr. Lajoyce Cornersuda of orthopedics was recommended a right foot fifth ray amputation tomorrow 06/06/2016 at 5 PM. Lower extremity Dopplers negative for DVT. Continue IV vancomycin. Patient has been seen in consultation by infectious disease who have added IV Rocephin and oral Flagyl to patient's antibiotic regimen. ID recommended antibiotics for 2 weeks post-amputation. Appreciate ID and also input and recommendations.  #2??? Diabetes mellitus Last hemoglobin A1c was 5.5 on 03/25/2009. Hemoglobin A1c pending. CBGs have ranged from 70-108. Sliding scale insulin.  #3 COPD/ILD Currently stable. Continue current DuoNeb's. Add Symbicort. Follow.  #4 HIV CD4 viral load pending. Continue current and her retroviral treatment is recommended per ID.  #5 depression Stable.     DVT prophylaxis: Lovenox Code Status: Full Family  Communication: Updated patient. No family at bedside. Disposition Plan: Home with home health versus SNF pending amputation and PT evaluation.   Consultants:   Orthopedics: Dr. Lajoyce Cornersuda 06/05/2016  Infectious disease: Dr. Ninetta LightsHatcher 06/05/2016  Procedures:   Lower extremity Dopplers 06/05/2016  Antimicrobials:   IV vancomycin 06/04/2016  IV Rocephin 06/05/2016  Oral Flagyl 06/05/2016   Subjective: Patient states feeling better than on admission. No chest pain. No shortness of breath. Patient states less pain in his right lower extremity.  Objective: Vitals:   06/05/16 0500 06/05/16 0818 06/05/16 1345 06/05/16 1413  BP: 110/78  110/62   Pulse: 76  80   Resp: 18  18   Temp: 97.4 F (36.3 C)  97.8 F (36.6 C)   TempSrc: Oral  Oral   SpO2: 94% 93% 93% 94%  Weight:      Height:        Intake/Output Summary (Last 24 hours) at 06/05/16 1440 Last data filed at 06/05/16 0845  Gross per 24 hour  Intake            837.5 ml  Output                0 ml  Net            837.5 ml   Filed Weights   06/04/16 1825 06/04/16 2100  Weight: 93.1 kg (205 lb 3 oz) 93 kg (205 lb)    Examination:  General exam: Appears calm and comfortable  Respiratory system: Clear to auscultation. Respiratory effort normal. Cardiovascular system: S1 & S2 heard, RRR. No JVD, murmurs, rubs, gallops or clicks. No pedal edema. Gastrointestinal system: Abdomen is nondistended,  soft and nontender. No organomegaly or masses felt. Normal bowel sounds heard. Central nervous system: Alert and oriented. No focal neurological deficits. Extremities: 2+ pulses. Right lower extremity with edema and ulcer noted on the plantar surface of the fifth metatarsal with some mild erythema. Warmth.  Skin: No rashes, lesions or ulcers Psychiatry: Judgement and insight appear normal. Mood & affect appropriate.     Data Reviewed: I have personally reviewed following labs and imaging studies  CBC:  Recent Labs Lab  06/04/16 1640 06/05/16 0551  WBC 8.3 4.0  NEUTROABS 7.1  --   HGB 12.2* 10.6*  HCT 36.9* 32.8*  MCV 86.4 87.9  PLT 202 152   Basic Metabolic Panel:  Recent Labs Lab 06/04/16 1640 06/05/16 0551  NA 132* 133*  K 3.7 3.5  CL 99* 101  CO2 22 25  GLUCOSE 124* 87  BUN 16 15  CREATININE 1.00 0.90  CALCIUM 9.4 8.7*  MG  --  1.7  PHOS  --  3.3   GFR: Estimated Creatinine Clearance: 106.6 mL/min (by C-G formula based on SCr of 0.9 mg/dL). Liver Function Tests:  Recent Labs Lab 06/04/16 1640 06/05/16 0551  AST 17 11*  ALT 12* 10*  ALKPHOS 94 74  BILITOT 1.3* 1.0  PROT 8.4* 6.9  ALBUMIN 3.7 3.0*   No results for input(s): LIPASE, AMYLASE in the last 168 hours. No results for input(s): AMMONIA in the last 168 hours. Coagulation Profile: No results for input(s): INR, PROTIME in the last 168 hours. Cardiac Enzymes: No results for input(s): CKTOTAL, CKMB, CKMBINDEX, TROPONINI in the last 168 hours. BNP (last 3 results) No results for input(s): PROBNP in the last 8760 hours. HbA1C: No results for input(s): HGBA1C in the last 72 hours. CBG:  Recent Labs Lab 06/04/16 2214 06/05/16 0719 06/05/16 1141  GLUCAP 74 70 108*   Lipid Profile: No results for input(s): CHOL, HDL, LDLCALC, TRIG, CHOLHDL, LDLDIRECT in the last 72 hours. Thyroid Function Tests:  Recent Labs  06/05/16 0551  TSH 0.903   Anemia Panel: No results for input(s): VITAMINB12, FOLATE, FERRITIN, TIBC, IRON, RETICCTPCT in the last 72 hours. Sepsis Labs:  Recent Labs Lab 06/04/16 1646 06/04/16 2024  LATICACIDVEN 1.80 0.85    Recent Results (from the past 240 hour(s))  Culture, blood (Routine X 2) w Reflex to ID Panel     Status: None (Preliminary result)   Collection Time: 06/04/16  5:45 PM  Result Value Ref Range Status   Specimen Description BLOOD RIGHT ANTECUBITAL  Final   Special Requests BOTTLES DRAWN AEROBIC AND ANAEROBIC  Final   Culture PENDING  Incomplete   Report Status  PENDING  Incomplete  MRSA PCR Screening     Status: None   Collection Time: 06/05/16  6:30 AM  Result Value Ref Range Status   MRSA by PCR NEGATIVE NEGATIVE Final    Comment:        The GeneXpert MRSA Assay (FDA approved for NASAL specimens only), is one component of a comprehensive MRSA colonization surveillance program. It is not intended to diagnose MRSA infection nor to guide or monitor treatment for MRSA infections.          Radiology Studies: Dg Chest 2 View  Result Date: 06/04/2016 CLINICAL DATA:  55 year old male with history of cough and fever for the past 4 days. Possible sepsis. EXAM: CHEST  2 VIEW COMPARISON:  Chest x-ray 06/09/2015. FINDINGS: Emphysematous changes are again noted throughout the lungs bilaterally, most notable in the lung  apices. No confluent consolidative airspace disease. Slight coarsening of interstitial markings throughout the mid to lower lungs bilaterally is chronic and similar to the prior examination. Bibasilar linear opacities favored to reflect areas of chronic scarring and/or subsegmental atelectasis. No pleural effusions. No evidence of pulmonary edema. Heart size is normal. The patient is rotated to the right on today's exam, resulting in distortion of the mediastinal contours and reduced diagnostic sensitivity and specificity for mediastinal pathology. IMPRESSION: 1. Chronic changes in the lungs suggestive of underlying COPD redemonstrated, as above. No definite radiographic evidence of acute cardiopulmonary disease. Electronically Signed   By: Trudie Reedaniel  Entrikin M.D.   On: 06/04/2016 16:41   Dg Foot Complete Right  Result Date: 06/05/2016 CLINICAL DATA:  Continued pain and foul odor from a nonhealing wound in the lateral aspect of the right foot at the fifth MTP joint. EXAM: RIGHT FOOT COMPLETE - 3+ VIEW COMPARISON:  05/06/2016. FINDINGS: Mildly progressive bone destruction involving the fifth metatarsal head. There is also dorsal dislocation of  the fifth toe at the level of the MTP joint. Associated soft tissue swelling. No soft tissue gas. Stable bone island in the second metatarsal. IMPRESSION: 1. Mildly progressive changes of osteomyelitis involving the fifth metatarsal head. 2. Dorsal dislocation of the fifth toe at the MTP joint. Electronically Signed   By: Beckie SaltsSteven  Reid M.D.   On: 06/05/2016 11:16        Scheduled Meds: . aspirin  81 mg Oral Daily  . cefTRIAXone (ROCEPHIN)  IV  2 g Intravenous Q24H  . [START ON 06/06/2016] chlorhexidine  60 mL Topical Once  . clonazePAM  1 mg Oral QHS  . dolutegravir  50 mg Oral Daily  . emtricitabine-tenofovir AF  1 tablet Oral Daily  . enoxaparin (LOVENOX) injection  40 mg Subcutaneous Q24H  . feeding supplement (ENSURE ENLIVE)  237 mL Oral BID BM  . guaiFENesin  600 mg Oral BID  . insulin aspart  0-5 Units Subcutaneous QHS  . insulin aspart  0-9 Units Subcutaneous TID WC  . ipratropium-albuterol  3 mL Nebulization TID  . metroNIDAZOLE  500 mg Oral Q8H  . morphine  30 mg Oral Q12H  . nicotine  21 mg Transdermal Daily  . nortriptyline  150 mg Oral QHS  . povidone-iodine  2 application Topical Once  . sodium chloride flush  3 mL Intravenous Q12H  . spironolactone  25 mg Oral Daily  . vancomycin  1,250 mg Intravenous Q12H   Continuous Infusions:   LOS: 1 day    Time spent: 35 minutes    THOMPSON,DANIEL, MD [facility] Pager [phone]  If 7PM-7AM, please contact night-coverage [email or web address] Password [email or web address] 06/05/2016, 2:40 PM

## 2016-06-05 NOTE — Progress Notes (Signed)
PCF central telemetry to report a 15 beat run of V-tach. The pt. Is sitting up in bed and is asymptomatic states he felt his heart flutter a little a few minutes ago but denies pain, SOB and Cp. Paged the Dr. On call to let them know. Central telemetry reports that this has been happening off and on since he got here yesterday.

## 2016-06-05 NOTE — Consult Note (Signed)
WOC Nurse wound consult note Reason for Consult: DFU to right foot, 5th metatarsal head.  Erythema on dorsal foot measuring 10cm. Seen by Dr. Lajoyce Cornersuda today and will have amputation of affected area tomorrow.  Wound type:Infectious, neuropathic Pressure Ulcer POA: No Measurement:Ulcer measures 1.5cm x 1cm with depth of 0.4cm Wound UJW:JXBJbed:pale pink, serous exudate Drainage (amount, consistency, odor) serous, small amount on old dressing Periwound:Macerated Dressing procedure/placement/frequency:I will ask bedside RN to perform a saline moistened gauze dressing today and tomorrow prior to operative procedure planned by Dr. Lajoyce Cornersuda. WOC nursing team will not follow, but will remain available to this patient, the nursing and medical teams.  Please re-consult if needed. Thanks, Ladona MowLaurie Itzy Adler, MSN, RN, GNP, Hans EdenCWOCN, CWON-AP, FAAN  Pager# [phone]

## 2016-06-05 NOTE — Progress Notes (Addendum)
*  Preliminary Results* Bilateral lower extremity venous duplex completed. Bilateral lower extremities are negative for deep vein thrombosis. There is no evidence of Baker's cyst bilaterally.  In consideration of the ABI order- the right distal posterior tibial artery and dorsalis pedis artery were evaluated and found to be patent with triphasic flow. Per Dr. Lajoyce Cornersuda the ABI is no longer needed.  06/05/2016 11:10 AM [name], BS, RVT, RDCS, RDMS

## 2016-06-05 NOTE — Progress Notes (Signed)
PHARMACY - PHYSICIAN COMMUNICATION CRITICAL VALUE ALERT - BLOOD CULTURE IDENTIFICATION (BCID)  Results for orders placed or performed during the hospital encounter of 06/04/16  Blood Culture ID Panel (Reflexed) (Collected: 06/04/2016  4:29 PM)  Result Value Ref Range   Enterococcus species NOT DETECTED NOT DETECTED   Listeria monocytogenes NOT DETECTED NOT DETECTED   Staphylococcus species DETECTED (A) NOT DETECTED   Staphylococcus aureus DETECTED (A) NOT DETECTED   Methicillin resistance NOT DETECTED NOT DETECTED   Streptococcus species NOT DETECTED NOT DETECTED   Streptococcus agalactiae NOT DETECTED NOT DETECTED   Streptococcus pneumoniae NOT DETECTED NOT DETECTED   Streptococcus pyogenes NOT DETECTED NOT DETECTED   Acinetobacter baumannii NOT DETECTED NOT DETECTED   Enterobacteriaceae species NOT DETECTED NOT DETECTED   Enterobacter cloacae complex NOT DETECTED NOT DETECTED   Escherichia coli NOT DETECTED NOT DETECTED   Klebsiella oxytoca NOT DETECTED NOT DETECTED   Klebsiella pneumoniae NOT DETECTED NOT DETECTED   Proteus species NOT DETECTED NOT DETECTED   Serratia marcescens NOT DETECTED NOT DETECTED   Haemophilus influenzae NOT DETECTED NOT DETECTED   Neisseria meningitidis NOT DETECTED NOT DETECTED   Pseudomonas aeruginosa NOT DETECTED NOT DETECTED   Candida albicans NOT DETECTED NOT DETECTED   Candida glabrata NOT DETECTED NOT DETECTED   Candida krusei NOT DETECTED NOT DETECTED   Candida parapsilosis NOT DETECTED NOT DETECTED   Candida tropicalis NOT DETECTED NOT DETECTED    Name of physician (or Provider) Contacted: K. Schorr  Changes to prescribed antibiotics required: pt on vancomycin no change  Arley PhenixEllen Janiyla Long RPh 06/05/2016, 9:10 PM Pager [phone]

## 2016-06-06 ENCOUNTER — Inpatient Hospital Stay (HOSPITAL_COMMUNITY): Payer: Medicare Other | Admitting: Anesthesiology

## 2016-06-06 ENCOUNTER — Inpatient Hospital Stay (HOSPITAL_COMMUNITY): Payer: Medicare Other

## 2016-06-06 ENCOUNTER — Encounter (HOSPITAL_COMMUNITY): Payer: Self-pay | Admitting: Anesthesiology

## 2016-06-06 ENCOUNTER — Encounter (HOSPITAL_COMMUNITY)
Admission: EM | Disposition: A | Discharge status: Home Health | Payer: Self-pay | Source: Home / Self Care | Attending: Internal Medicine

## 2016-06-06 DIAGNOSIS — Z21 Asymptomatic human immunodeficiency virus [HIV] infection status: Secondary | ICD-10-CM

## 2016-06-06 DIAGNOSIS — I472 Ventricular tachycardia: Secondary | ICD-10-CM

## 2016-06-06 DIAGNOSIS — M86179 Other acute osteomyelitis, unspecified ankle and foot: Secondary | ICD-10-CM

## 2016-06-06 DIAGNOSIS — L97514 Non-pressure chronic ulcer of other part of right foot with necrosis of bone: Secondary | ICD-10-CM

## 2016-06-06 DIAGNOSIS — I340 Nonrheumatic mitral (valve) insufficiency: Secondary | ICD-10-CM

## 2016-06-06 DIAGNOSIS — E440 Moderate protein-calorie malnutrition: Secondary | ICD-10-CM | POA: Insufficient documentation

## 2016-06-06 DIAGNOSIS — B9561 Methicillin susceptible Staphylococcus aureus infection as the cause of diseases classified elsewhere: Secondary | ICD-10-CM

## 2016-06-06 HISTORY — PX: AMPUTATION: SHX166

## 2016-06-06 LAB — CBC
HCT: 32.5 % — ABNORMAL LOW (ref 39.0–52.0)
Hemoglobin: 10.4 g/dL — ABNORMAL LOW (ref 13.0–17.0)
MCH: 28.3 pg (ref 26.0–34.0)
MCHC: 32 g/dL (ref 30.0–36.0)
MCV: 88.3 fL (ref 78.0–100.0)
PLATELETS: 161 10*3/uL (ref 150–400)
RBC: 3.68 MIL/uL — AB (ref 4.22–5.81)
RDW: 14.9 % (ref 11.5–15.5)
WBC: 2.9 10*3/uL — AB (ref 4.0–10.5)

## 2016-06-06 LAB — BASIC METABOLIC PANEL
ANION GAP: 8 (ref 5–15)
BUN: 16 mg/dL (ref 6–20)
CALCIUM: 8.7 mg/dL — AB (ref 8.9–10.3)
CO2: 27 mmol/L (ref 22–32)
Chloride: 100 mmol/L — ABNORMAL LOW (ref 101–111)
Creatinine, Ser: 0.81 mg/dL (ref 0.61–1.24)
GLUCOSE: 108 mg/dL — AB (ref 65–99)
POTASSIUM: 3.5 mmol/L (ref 3.5–5.1)
SODIUM: 135 mmol/L (ref 135–145)

## 2016-06-06 LAB — MAGNESIUM: MAGNESIUM: 2.2 mg/dL (ref 1.7–2.4)

## 2016-06-06 LAB — HEMOGLOBIN A1C
HEMOGLOBIN A1C: 5.3 % (ref 4.8–5.6)
MEAN PLASMA GLUCOSE: 105 mg/dL

## 2016-06-06 LAB — T-HELPER CELLS (CD4) COUNT (NOT AT ARMC)
CD4 T CELL ABS: 40 /uL — AB (ref 400–2700)
CD4 T CELL HELPER: 10 % — AB (ref 33–55)

## 2016-06-06 LAB — ECHOCARDIOGRAM COMPLETE
Height: 74 in
Weight: 3280 oz

## 2016-06-06 LAB — GLUCOSE, CAPILLARY
GLUCOSE-CAPILLARY: 96 mg/dL (ref 65–99)
GLUCOSE-CAPILLARY: 107 mg/dL — AB (ref 65–99)
GLUCOSE-CAPILLARY: 124 mg/dL — AB (ref 65–99)
GLUCOSE-CAPILLARY: 87 mg/dL (ref 65–99)
GLUCOSE-CAPILLARY: 93 mg/dL (ref 65–99)
Glucose-Capillary: 81 mg/dL (ref 65–99)

## 2016-06-06 SURGERY — AMPUTATION, FOOT, RAY
Anesthesia: Monitor Anesthesia Care | Site: Foot | Laterality: Right

## 2016-06-06 MED ORDER — ROPIVACAINE HCL 5 MG/ML IJ SOLN
INTRAMUSCULAR | Status: AC
  Filled 2016-06-06: qty 60

## 2016-06-06 MED ORDER — AMIODARONE HCL IN DEXTROSE 360-4.14 MG/200ML-% IV SOLN
30.0000 mg/h | INTRAVENOUS | Status: DC
  Administered 2016-06-06: 28.8 mg/h via INTRAVENOUS
  Administered 2016-06-07: 30 mg/h via INTRAVENOUS
  Filled 2016-06-06 (×4): qty 200

## 2016-06-06 MED ORDER — BUDESONIDE 0.25 MG/2ML IN SUSP
0.2500 mg | Freq: Two times a day (BID) | RESPIRATORY_TRACT | Status: DC
  Administered 2016-06-06 – 2016-06-10 (×7): 0.25 mg via RESPIRATORY_TRACT
  Filled 2016-06-06 (×9): qty 2

## 2016-06-06 MED ORDER — ACETAMINOPHEN 325 MG PO TABS: 650.0000 mg | ORAL_TABLET | Freq: Four times a day (QID) | ORAL | Status: DC | PRN

## 2016-06-06 MED ORDER — ONDANSETRON HCL 4 MG/2ML IJ SOLN: 4.0000 mg | Freq: Four times a day (QID) | INTRAMUSCULAR | Status: DC | PRN

## 2016-06-06 MED ORDER — POTASSIUM CHLORIDE CRYS ER 20 MEQ PO TBCR
40.0000 meq | EXTENDED_RELEASE_TABLET | Freq: Once | ORAL | Status: AC
  Administered 2016-06-06: 40 meq via ORAL
  Filled 2016-06-06: qty 2

## 2016-06-06 MED ORDER — METOPROLOL TARTRATE 50 MG PO TABS
50.0000 mg | ORAL_TABLET | Freq: Two times a day (BID) | ORAL | Status: AC
  Administered 2016-06-06: 50 mg via ORAL
  Filled 2016-06-06: qty 1

## 2016-06-06 MED ORDER — OXYCODONE HCL 5 MG PO TABS
5.0000 mg | ORAL_TABLET | ORAL | Status: DC | PRN
  Administered 2016-06-07 – 2016-06-10 (×3): 10 mg via ORAL
  Filled 2016-06-06 (×4): qty 2

## 2016-06-06 MED ORDER — METOCLOPRAMIDE HCL 5 MG/ML IJ SOLN: 5.0000 mg | Freq: Three times a day (TID) | INTRAMUSCULAR | Status: DC | PRN

## 2016-06-06 MED ORDER — BENZONATATE 100 MG PO CAPS: 200.0000 mg | ORAL_CAPSULE | Freq: Three times a day (TID) | ORAL | Status: DC | PRN

## 2016-06-06 MED ORDER — LIDOCAINE 2% (20 MG/ML) 5 ML SYRINGE
INTRAMUSCULAR | Status: AC
  Filled 2016-06-06: qty 5

## 2016-06-06 MED ORDER — ACETAMINOPHEN 650 MG RE SUPP: 650.0000 mg | Freq: Four times a day (QID) | RECTAL | Status: DC | PRN

## 2016-06-06 MED ORDER — METHOCARBAMOL 500 MG PO TABS
500.0000 mg | ORAL_TABLET | Freq: Four times a day (QID) | ORAL | Status: DC | PRN
  Filled 2016-06-06: qty 1

## 2016-06-06 MED ORDER — SODIUM CHLORIDE 0.9 % IV SOLN
INTRAVENOUS | Status: DC
  Administered 2016-06-06: 17:00:00 via INTRAVENOUS

## 2016-06-06 MED ORDER — ADULT MULTIVITAMIN W/MINERALS CH
1.0000 | ORAL_TABLET | Freq: Every day | ORAL | Status: DC
  Administered 2016-06-07 – 2016-06-10 (×4): 1 via ORAL
  Filled 2016-06-06 (×4): qty 1

## 2016-06-06 MED ORDER — ONDANSETRON HCL 4 MG PO TABS: 4.0000 mg | ORAL_TABLET | Freq: Four times a day (QID) | ORAL | Status: DC | PRN

## 2016-06-06 MED ORDER — FENTANYL CITRATE (PF) 100 MCG/2ML IJ SOLN
INTRAMUSCULAR | Status: DC | PRN
  Administered 2016-06-06 (×2): 50 ug via INTRAVENOUS

## 2016-06-06 MED ORDER — SODIUM CHLORIDE 0.9 % IV SOLN
INTRAVENOUS | Status: DC | PRN
  Administered 2016-06-06: 18:00:00 via INTRAVENOUS

## 2016-06-06 MED ORDER — ROPIVACAINE HCL 5 MG/ML IJ SOLN
INTRAMUSCULAR | Status: DC | PRN
  Administered 2016-06-06: 40 mL via PERINEURAL

## 2016-06-06 MED ORDER — 0.9 % SODIUM CHLORIDE (POUR BTL) OPTIME
TOPICAL | Status: DC | PRN
  Administered 2016-06-06: 1000 mL

## 2016-06-06 MED ORDER — AMIODARONE HCL IN DEXTROSE 360-4.14 MG/200ML-% IV SOLN
60.0000 mg/h | INTRAVENOUS | Status: DC
  Administered 2016-06-06: 60 mg/h via INTRAVENOUS
  Filled 2016-06-06: qty 200

## 2016-06-06 MED ORDER — CEFAZOLIN SODIUM-DEXTROSE 2-4 GM/100ML-% IV SOLN
2.0000 g | Freq: Three times a day (TID) | INTRAVENOUS | Status: DC
  Administered 2016-06-06 – 2016-06-10 (×12): 2 g via INTRAVENOUS
  Filled 2016-06-06 (×17): qty 100

## 2016-06-06 MED ORDER — FENTANYL CITRATE (PF) 100 MCG/2ML IJ SOLN
INTRAMUSCULAR | Status: AC
  Filled 2016-06-06: qty 2

## 2016-06-06 MED ORDER — SODIUM CHLORIDE 0.9 % IV SOLN: INTRAVENOUS | Status: DC

## 2016-06-06 MED ORDER — PROPOFOL 10 MG/ML IV BOLUS
INTRAVENOUS | Status: AC
  Filled 2016-06-06: qty 40

## 2016-06-06 MED ORDER — MIDAZOLAM HCL 5 MG/5ML IJ SOLN
INTRAMUSCULAR | Status: DC | PRN
  Administered 2016-06-06: 2 mg via INTRAVENOUS

## 2016-06-06 MED ORDER — METOPROLOL TARTRATE 50 MG PO TABS
50.0000 mg | ORAL_TABLET | Freq: Two times a day (BID) | ORAL | Status: DC
  Administered 2016-06-07 – 2016-06-10 (×8): 50 mg via ORAL
  Filled 2016-06-06 (×8): qty 1

## 2016-06-06 MED ORDER — MIDAZOLAM HCL 2 MG/2ML IJ SOLN
INTRAMUSCULAR | Status: AC
  Filled 2016-06-06: qty 2

## 2016-06-06 MED ORDER — SODIUM CHLORIDE 0.9 % IV BOLUS (SEPSIS)
500.0000 mL | Freq: Once | INTRAVENOUS | Status: AC
  Administered 2016-06-06: 500 mL via INTRAVENOUS

## 2016-06-06 MED ORDER — HYDROCOD POLST-CPM POLST ER 10-8 MG/5ML PO SUER
5.0000 mL | Freq: Once | ORAL | Status: AC
  Administered 2016-06-06: 5 mL via ORAL
  Filled 2016-06-06: qty 5

## 2016-06-06 MED ORDER — AMIODARONE LOAD VIA INFUSION
150.0000 mg | Freq: Once | INTRAVENOUS | Status: AC
  Administered 2016-06-06: 150 mg via INTRAVENOUS
  Filled 2016-06-06: qty 83.34

## 2016-06-06 MED ORDER — ARFORMOTEROL TARTRATE 15 MCG/2ML IN NEBU
15.0000 ug | INHALATION_SOLUTION | Freq: Two times a day (BID) | RESPIRATORY_TRACT | Status: DC
  Administered 2016-06-06 – 2016-06-10 (×7): 15 ug via RESPIRATORY_TRACT
  Filled 2016-06-06 (×11): qty 2

## 2016-06-06 MED ORDER — METOPROLOL TARTRATE 50 MG PO TABS
50.0000 mg | ORAL_TABLET | Freq: Once | ORAL | Status: AC
  Administered 2016-06-06: 50 mg via ORAL
  Filled 2016-06-06: qty 1

## 2016-06-06 MED ORDER — METHOCARBAMOL 1000 MG/10ML IJ SOLN
500.0000 mg | Freq: Four times a day (QID) | INTRAVENOUS | Status: DC | PRN
  Filled 2016-06-06: qty 5

## 2016-06-06 MED ORDER — METOCLOPRAMIDE HCL 5 MG PO TABS: 5.0000 mg | ORAL_TABLET | Freq: Three times a day (TID) | ORAL | Status: DC | PRN

## 2016-06-06 MED ORDER — HYDROMORPHONE HCL 1 MG/ML IJ SOLN: 1.0000 mg | INTRAMUSCULAR | Status: DC | PRN

## 2016-06-06 MED ORDER — SODIUM CHLORIDE 0.9 % IV SOLN
INTRAVENOUS | Status: DC
  Administered 2016-06-07: 03:00:00 via INTRAVENOUS

## 2016-06-06 MED ORDER — PROPOFOL 500 MG/50ML IV EMUL
INTRAVENOUS | Status: DC | PRN
  Administered 2016-06-06: 140 ug/kg/min via INTRAVENOUS

## 2016-06-06 SURGICAL SUPPLY — 35 items
BAG ZIPLOCK 12X15 (MISCELLANEOUS) ×3 IMPLANT
BANDAGE ESMARK 6X9 LF (GAUZE/BANDAGES/DRESSINGS) ×1 IMPLANT
BNDG COHESIVE 3X5 TAN STRL LF (GAUZE/BANDAGES/DRESSINGS) ×3 IMPLANT
BNDG COHESIVE 4X5 TAN STRL (GAUZE/BANDAGES/DRESSINGS) ×3 IMPLANT
BNDG COHESIVE 6X5 TAN STRL LF (GAUZE/BANDAGES/DRESSINGS) ×3 IMPLANT
BNDG ESMARK 6X9 LF (GAUZE/BANDAGES/DRESSINGS) ×3
BNDG GAUZE ELAST 4 BULKY (GAUZE/BANDAGES/DRESSINGS) ×3 IMPLANT
CUFF TOURN SGL QUICK 34 (TOURNIQUET CUFF) ×2
CUFF TRNQT CYL 34X4X40X1 (TOURNIQUET CUFF) ×1 IMPLANT
DRAPE SHEET LG 3/4 BI-LAMINATE (DRAPES) ×3 IMPLANT
DRAPE SURG 17X11 SM STRL (DRAPES) ×6 IMPLANT
DRAPE U-SHAPE 47X51 STRL (DRAPES) ×6 IMPLANT
DRSG ADAPTIC 3X8 NADH LF (GAUZE/BANDAGES/DRESSINGS) ×3 IMPLANT
DURAPREP 26ML APPLICATOR (WOUND CARE) ×3 IMPLANT
ELECT REM PT RETURN 9FT ADLT (ELECTROSURGICAL) ×3
ELECTRODE REM PT RTRN 9FT ADLT (ELECTROSURGICAL) ×1 IMPLANT
GAUZE SPONGE 4X4 12PLY STRL (GAUZE/BANDAGES/DRESSINGS) ×3 IMPLANT
GLOVE BIOGEL PI IND STRL 8.5 (GLOVE) ×1 IMPLANT
GLOVE BIOGEL PI INDICATOR 8.5 (GLOVE) ×2
GLOVE SURG ORTHO 9.0 STRL STRW (GLOVE) ×3 IMPLANT
GOWN STRL REUS W/ TWL XL LVL3 (GOWN DISPOSABLE) ×1 IMPLANT
GOWN STRL REUS W/TWL XL LVL3 (GOWN DISPOSABLE) ×2
KIT BASIN OR (CUSTOM PROCEDURE TRAY) ×3 IMPLANT
MANIFOLD NEPTUNE II (INSTRUMENTS) ×3 IMPLANT
NS IRRIG 1000ML POUR BTL (IV SOLUTION) ×3 IMPLANT
PACK TOTAL JOINT (CUSTOM PROCEDURE TRAY) ×3 IMPLANT
PAD ABD 8X10 STRL (GAUZE/BANDAGES/DRESSINGS) ×3 IMPLANT
PAD CAST 4YDX4 CTTN HI CHSV (CAST SUPPLIES) ×1 IMPLANT
PADDING CAST COTTON 4X4 STRL (CAST SUPPLIES) ×2
POSITIONER SURGICAL ARM (MISCELLANEOUS) ×6 IMPLANT
STOCKINETTE 8 INCH (MISCELLANEOUS) ×3 IMPLANT
SUCTION FRAZIER HANDLE 10FR (MISCELLANEOUS) ×2
SUCTION TUBE FRAZIER 10FR DISP (MISCELLANEOUS) ×1 IMPLANT
SUT ETHILON 2 0 PSLX (SUTURE) ×6 IMPLANT
WATER STERILE IRR 1500ML POUR (IV SOLUTION) ×3 IMPLANT

## 2016-06-06 NOTE — Progress Notes (Signed)
Initial Nutrition Assessment  DOCUMENTATION CODES:   Non-severe (moderate) malnutrition in context of acute illness/injury  INTERVENTION:  Continue Ensure Enlive po BID, each supplement provides 350 kcal and 20 grams of protein.  Provide multivitamin with minerals daily to promote wound healing and in setting of HIV/AIDS.  Encouraged adequate intake of protein with meals, snacks, and beverages.   NUTRITION DIAGNOSIS:   Increased nutrient needs related to wound healing as evidenced by estimated needs.  GOAL:   Patient will meet greater than or equal to 90% of their needs  MONITOR:   PO intake, Supplement acceptance, Labs, Weight trends, I & O's, Skin  REASON FOR ASSESSMENT:   Malnutrition Screening Tool, Consult Wound healing  ASSESSMENT:   55 y.o. 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 tach with normal cardiac catheterizationbeing admitted for foot ulcer complicatedby osteomyelitis, palpitations and history of COPD.   -Plan is for right foot amputation later today at 5pm.  Spoke with patient at bedside. He reports appetite has been poor for the past week after his sister passed away. However, he reports he has been eating more than he normally does (100% of 3 meals per day). Patient denies N/V, abdominal pain, or constipation/diarrhea. Endorses occasional difficulty chewing as he does not have any dentures, but patient reports he can cut up his meat on his own.   Reports UBW 212-213 lbs and that he has had weight loss. Patient has lost 8 lbs (4% body weight) over 2 months, which is not significant for time frame.  Meal Completion: 100% of HH/CHO Mod diet prior to being NPO per chart.  Medications reviewed and include: Novolog sliding scale TID with meals, vancomycin.  Labs reviewed: CBG 96-115 past 24 hrs, Chloride 100, HgbA1c 5.3 on 12/3.  Nutrition-Focused physical exam completed. Findings are no fat  depletion, mild muscle depletion, and moderate edema.   Patient meets criteria for moderate acute malnutrition in setting of mild muscle depletion, moderate fluid accumulation.  Diet Order:  Diet NPO time specified Except for: Sips with Meds  Skin:  Wound (see comment) (Stg III to right foot)  Last BM:  06/03/2016  Height:   Ht Readings from Last 1 Encounters:  06/04/16 6\' 2"  (1.88 m)    Weight:   Wt Readings from Last 1 Encounters:  06/04/16 205 lb (93 kg)    Ideal Body Weight:  86.4 kg  BMI:  Body mass index is 26.32 kg/m.  Estimated Nutritional Needs:   Kcal:  2200-2500 (MSJ x 1.2-1.4)  Protein:  110-130 grams (1.2-1.4 grams/kg)  Fluid:  >/= 2.5 L/day  EDUCATION NEEDS:   Education needs addressed  Helane RimaLeanne Bria Sparr, MS, RD, LDN Pager: [phone] After Hours Pager: [phone]

## 2016-06-06 NOTE — Anesthesia Preprocedure Evaluation (Signed)
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  Reproductive/Obstetrics                            Anesthesia Physical Anesthesia Plan  ASA: IV  Anesthesia Plan: Regional and MAC   Post-op Pain Management:  Regional for Post-op pain   Induction: Intravenous  Airway Management Planned: Nasal Cannula  Additional Equipment:   Intra-op Plan:   Post-operative Plan:   Informed Consent: I have reviewed the patients History and Physical, chart, labs and discussed the procedure including the risks, benefits and alternatives for the proposed anesthesia with the patient or authorized representative who has indicated his/her understanding and acceptance.   Dental advisory given  Plan Discussed with:   Anesthesia Plan Comments: (Risks/benefits of regional block discussed with patient including risk  of bleeding, infection, nerve damage, and possibility of failed block.  Also discussed backup plan of general anesthesia and associated risks.  Patient wishes to proceed.  Right popliteal/saphenous nerve block.)        Anesthesia Quick Evaluation

## 2016-06-06 NOTE — Progress Notes (Signed)
[facility]   Reason for visit: Follow up on Staph aureus bacteremia  Interval History: now with bacteremia with Staph aurues, mssa, 1/2 blood cultures.  Afebrile, amputation today.  No complaints, no fever.  No chills, no diarrhea, no rash.     Physical Exam: Constitutional:  Vitals:   06/06/16 1425 06/06/16 1503  BP: 92/66 95/68  Pulse: 67 66  Resp:    Temp:  98.4 F (36.9 C)   patient appears in NAD Eyes: anicteric HENT: no thrush Respiratory: Normal respiratory effort; CTA B Cardiovascular: RRR GI: soft, nt, nd  Review of Systems: Constitutional: negative for fevers, chills, sweats and malaise Gastrointestinal: negative for nausea and diarrhea  Lab Results  Component Value Date   WBC 2.9 (L) 06/06/2016   HGB 10.4 (L) 06/06/2016   HCT 32.5 (L) 06/06/2016   MCV 88.3 06/06/2016   PLT 161 06/06/2016    Lab Results  Component Value Date   CREATININE 0.81 06/06/2016   BUN 16 06/06/2016   NA 135 06/06/2016   K 3.5 06/06/2016   CL 100 (L) 06/06/2016   CO2 27 06/06/2016    Lab Results  Component Value Date   ALT 10 (L) 06/05/2016   AST 11 (L) 06/05/2016   ALKPHOS 74 06/05/2016     Microbiology: Recent Results (from the past 240 hour(s))  Culture, blood (Routine X 2) w Reflex to ID Panel     Status: Abnormal (Preliminary result)   Collection Time: 06/04/16  4:29 PM  Result Value Ref Range Status   Specimen Description BLOOD BLOOD RIGHT FOREARM  Final   Special Requests BOTTLES DRAWN AEROBIC AND ANAEROBIC 5ML EA  Final   Culture  Setup Time   Final    GRAM POSITIVE COCCI IN CLUSTERS IN BOTH AEROBIC AND ANAEROBIC BOTTLES CRITICAL RESULT CALLED TO, READ BACK BY AND VERIFIED WITH: E JACKSON,PHARMD AT 2050 06/05/16 BY Geoffery SpruceV WILKINS Performed at [facility]    Culture STAPHYLOCOCCUS AUREUS (A)  Final   Report Status PENDING  Incomplete  Blood Culture ID Panel (Reflexed)     Status: Abnormal   Collection Time: 06/04/16  4:29 PM    Result Value Ref Range Status   Enterococcus species NOT DETECTED NOT DETECTED Final   Listeria monocytogenes NOT DETECTED NOT DETECTED Final   Staphylococcus species DETECTED (A) NOT DETECTED Final    Comment: CRITICAL RESULT CALLED TO, READ BACK BY AND VERIFIED WITH: E.JACKSON,PHARMD 06/05/16 @2050  BY V.WILKINS    Staphylococcus aureus DETECTED (A) NOT DETECTED Final    Comment: CRITICAL RESULT CALLED TO, READ BACK BY AND VERIFIED WITH: E.JACKSON,PHARMD 06/05/16 @2050  BY V.WILKINS    Methicillin resistance NOT DETECTED NOT DETECTED Final   Streptococcus species NOT DETECTED NOT DETECTED Final   Streptococcus agalactiae NOT DETECTED NOT DETECTED Final   Streptococcus pneumoniae NOT DETECTED NOT DETECTED Final   Streptococcus pyogenes NOT DETECTED NOT DETECTED Final   Acinetobacter baumannii NOT DETECTED NOT DETECTED Final   Enterobacteriaceae species NOT DETECTED NOT DETECTED Final   Enterobacter cloacae complex NOT DETECTED NOT DETECTED Final   Escherichia coli NOT DETECTED NOT DETECTED Final   Klebsiella oxytoca NOT DETECTED NOT DETECTED Final   Klebsiella pneumoniae NOT DETECTED NOT DETECTED Final   Proteus species NOT DETECTED NOT DETECTED Final   Serratia marcescens NOT DETECTED NOT DETECTED Final   Haemophilus influenzae NOT DETECTED NOT DETECTED Final   Neisseria meningitidis NOT DETECTED NOT DETECTED Final   Pseudomonas aeruginosa NOT DETECTED NOT  DETECTED Final   Candida albicans NOT DETECTED NOT DETECTED Final   Candida glabrata NOT DETECTED NOT DETECTED Final   Candida krusei NOT DETECTED NOT DETECTED Final   Candida parapsilosis NOT DETECTED NOT DETECTED Final   Candida tropicalis NOT DETECTED NOT DETECTED Final    Comment: Performed at [facility]  Culture, blood (Routine X 2) w Reflex to ID Panel     Status: None (Preliminary result)   Collection Time: 06/04/16  5:45 PM  Result Value Ref Range Status   Specimen Description BLOOD RIGHT ANTECUBITAL  Final    Special Requests BOTTLES DRAWN AEROBIC AND ANAEROBIC 5ML  Final   Culture PENDING  Incomplete   Report Status PENDING  Incomplete  MRSA PCR Screening     Status: None   Collection Time: 06/05/16  6:30 AM  Result Value Ref Range Status   MRSA by PCR NEGATIVE NEGATIVE Final    Comment:        The GeneXpert MRSA Assay (FDA approved for NASAL specimens only), is one component of a comprehensive MRSA colonization surveillance program. It is not intended to diagnose MRSA infection nor to guide or monitor treatment for MRSA infections.     Impression/Plan:  1. Osteomyelitis - amputation today.  Will get IV antibiotics post discharge related to Staph aureus 2.  MSSA bacteremia - TTE done, likely source is toe but he does need a TEE.  I will repeat blood cultures for the am. Will need picc line when blood cultures negative at least 48 hours.  No indication for vancomycin with MSSA and not colonized so I will stop.  I also will stop flagyl.  I will change ceftriaxone to cefazolin 3.  HIV - on Tivicay/Descovy and doing well.  CD4 down to 40 but likely secondary to acute illness.  Viral load was sent as well so will monitor.  No changes indicated at this time.

## 2016-06-06 NOTE — Progress Notes (Signed)
  Echocardiogram 2D Echocardiogram has been performed.  [name], [name] 06/06/2016, 2:55 PM

## 2016-06-06 NOTE — Progress Notes (Signed)
  Amiodarone Drug - Drug Interaction Consult Note  Recommendations: Hold nortriptyline    Amiodarone is metabolized by the cytochrome P450 system and therefore has the potential to cause many drug interactions. Amiodarone has an average plasma half-life of 50 days (range 20 to 100 days).   There is potential for drug interactions to occur several weeks or months after stopping treatment and the onset of drug interactions may be slow after initiating amiodarone.   []  Statins: Increased risk of myopathy. Simvastatin- restrict dose to 20mg  daily. Other statins: counsel patients to report any muscle pain or weakness immediately.  []  Anticoagulants: Amiodarone can increase anticoagulant effect. Consider warfarin dose reduction. Patients should be monitored closely and the dose of anticoagulant altered accordingly, remembering that amiodarone levels take several weeks to stabilize.  []  Antiepileptics: Amiodarone can increase plasma concentration of phenytoin, the dose should be reduced. Note that small changes in phenytoin dose can result in large changes in levels. Monitor patient and counsel on signs of toxicity.  []  Beta blockers: increased risk of bradycardia, AV block and myocardial depression. Sotalol - avoid concomitant use.  []   Calcium channel blockers (diltiazem and verapamil): increased risk of bradycardia, AV block and myocardial depression.  []   Cyclosporine: Amiodarone increases levels of cyclosporine. Reduced dose of cyclosporine is recommended.  []  Digoxin dose should be halved when amiodarone is started.  []  Diuretics: increased risk of cardiotoxicity if hypokalemia occurs.  []  Oral hypoglycemic agents (glyburide, glipizide, glimepiride): increased risk of hypoglycemia. Patient's glucose levels should be monitored closely when initiating amiodarone therapy.   [x]  Drugs that prolong the QT interval:  Torsades de pointes risk may be increased with concurrent use - avoid if  possible.  Monitor QTc, also keep magnesium/potassium WNL if concurrent therapy can't be avoided. Marland Kitchen. Antibiotics: e.g. fluoroquinolones, erythromycin. . Antiarrhythmics: e.g. quinidine, procainamide, disopyramide, sotalol. . Antipsychotics: e.g. phenothiazines, haloperidol.  . Lithium, tricyclic antidepressants, and methadone.  Per discussion with Dr. Janee Mornhompson, will hold Nortriptyline. As of now it is unclear if pt will need to go on amiodarone long term. Therefore will hold the nortriptyline for a few days in order to see if pt will be needing amiodarone long term.   Thank You,    Adalberto ColeNikola Arelie Kuzel, PharmD, BCPS Pager [phone] 06/06/2016 12:35 PM

## 2016-06-06 NOTE — Progress Notes (Signed)
PROGRESS NOTE    [name]  [name]:[id number] DOB: Dec 31, 1960 DOA: 06/04/2016 PCP: Johny Sax, MD    Brief Narrative:  55 y.o. 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 Patient presented with right foot ulcer with some cellulitis. Patient currently afebrile. Patient has been seen in consultation by Dr. Lajoyce Corners of orthopedics was recommended a right foot fifth ray amputation tomorrow 06/06/2016 at 5 PM. Lower extremity Dopplers negative for DVT. Continue IV vancomycin. Patient has been seen in consultation by infectious disease who have added IV Rocephin and oral Flagyl to patient's antibiotic regimen. ID recommended antibiotics for 2 weeks post-amputation. Appreciate ID input and recommendations. Patient has been seen by cardiology due to runs of V. tach. Patient has been placed on Lopressor 50 mg twice daily as well as amiodarone drip. Per cardiology okay to proceed with surgery.  #2 V. tach Patient is noted to have several bursts of asymptomatic nonsustained V. tach 1 approximately 19 beats. EKG done shows a sinus rhythm ER interval of 208 with no  acute ischemic changes. Patient has been started on Lopressor 50 mg twice daily and placed on amiodarone drip in the preoperative phase per cardiology.  #3??? Diabetes mellitus Last hemoglobin A1c was 5.5 on 03/25/2009. Hemoglobin A1c = 5.3. CBGs have ranged from 70-108. D/C Sliding scale insulin.  #4 COPD/ILD Currently stable. Place on Pulmicort and Brovana.  #5 HIV CD4 count pending. Viral load 30. Continue current antiretroviral treatment is recommended per ID.  #6 depression Stable.     DVT prophylaxis: Lovenox Code Status: Full Family Communication: Updated patient. No family at bedside. Disposition Plan: Home with home health versus SNF pending amputation and PT evaluation.   Consultants:   Orthopedics: Dr. Lajoyce Corners 06/05/2016  Infectious disease: Dr. Ninetta Lights 06/05/2016  Cardiology: Dr. Eden Emms 06/06/2016.  Procedures:   Lower extremity Dopplers 06/05/2016  Antimicrobials:   IV vancomycin 06/04/2016  IV Rocephin 06/05/2016  Oral Flagyl 06/05/2016   Subjective: Patient states feeling better than on admission. No chest pain. No shortness of breath. Patient noted to have several runs of vtach overnight and this morning. Patient asymptomatic.   Objective: Vitals:   06/05/16 2122 06/05/16 2144 06/06/16 0524 06/06/16 0934  BP: 121/69  (!) 107/59   Pulse: 84  82   Resp: 16  19   Temp: 98.6 F (37 C)  97.9 F (36.6 C)   TempSrc: Oral  Oral   SpO2: 94% 92% 94% 90%  Weight:      Height:        Intake/Output Summary (Last 24 hours) at 06/06/16 1130 Last data filed at 06/06/16 1005  Gross per 24 hour  Intake              300 ml  Output                0 ml  Net              300 ml   Filed Weights   06/04/16 1825 06/04/16 2100  Weight: 93.1 kg (205 lb 3 oz) 93 kg (205 lb)    Examination:  General exam: Appears calm and comfortable  Respiratory system: Clear to auscultation. Respiratory effort normal. Cardiovascular system: S1 & S2 heard, RRR. Loud 3/6  SEM LUSB. No JVD, murmurs, rubs, gallops or clicks. No pedal edema. Gastrointestinal system: Abdomen is nondistended, soft and nontender. No organomegaly or masses felt. Normal bowel sounds heard. Central nervous system: Alert and oriented. No focal neurological deficits. Extremities: 2+ pulses. Right lower extremity with edema and ulcer noted on the plantar surface of the fifth metatarsal with decreasing erythema. Warmth.  Skin: No rashes, lesions or ulcers Psychiatry: Judgement and insight appear normal. Mood & affect appropriate.     Data Reviewed: I have personally reviewed following labs and imaging studies  CBC:  Recent Labs Lab 06/04/16 1640 06/05/16 0551 06/06/16 0520  WBC 8.3 4.0 2.9*  NEUTROABS 7.1  --   --   HGB 12.2* 10.6* 10.4*  HCT 36.9* 32.8* 32.5*  MCV 86.4 87.9 88.3  PLT 202 152 161   Basic Metabolic Panel:  Recent Labs Lab 06/04/16 1640 06/05/16 0551 06/06/16 0520  NA 132* 133* 135  K 3.7 3.5 3.5  CL 99* 101 100*  CO2 22 25 27   GLUCOSE 124* 87 108*  BUN 16 15 16   CREATININE 1.00 0.90 0.81  CALCIUM 9.4 8.7* 8.7*  MG  --  1.7 2.2  PHOS  --  3.3  --    GFR: Estimated Creatinine Clearance: 118.4 mL/min (by C-G formula based on SCr of 0.81 mg/dL). Liver Function Tests:  Recent Labs Lab 06/04/16 1640 06/05/16 0551  AST 17 11*  ALT 12* 10*  ALKPHOS 94 74  BILITOT 1.3* 1.0  PROT 8.4* 6.9  ALBUMIN 3.7 3.0*   No results for input(s): LIPASE, AMYLASE in the last 168 hours. No results for input(s): AMMONIA in the last 168 hours. Coagulation Profile: No results for input(s): INR, PROTIME in the last 168 hours. Cardiac Enzymes: No results for input(s): CKTOTAL, CKMB, CKMBINDEX, TROPONINI in the last 168 hours. BNP (last 3 results) No results for input(s): PROBNP in the last 8760 hours. HbA1C:  Recent Labs  06/05/16 0551  HGBA1C 5.3   CBG:  Recent Labs Lab 06/05/16 0719 06/05/16 1141 06/05/16 1638 06/05/16 2110 06/06/16 0728  GLUCAP  70 108* 115* 96 107*   Lipid Profile: No results for input(s): CHOL, HDL, LDLCALC, TRIG, CHOLHDL, LDLDIRECT in the last 72 hours. Thyroid Function Tests:  Recent Labs  06/05/16 0551  TSH 0.903   Anemia Panel: No results for input(s): VITAMINB12, FOLATE, FERRITIN, TIBC, IRON, RETICCTPCT in the last 72 hours. Sepsis Labs:  Recent Labs Lab 06/04/16 1646 06/04/16 2024  LATICACIDVEN 1.80 0.85    Recent Results (from the past 240 hour(s))  Culture, blood (Routine X 2) w Reflex to ID Panel     Status: Abnormal (Preliminary result)   Collection Time: 06/04/16  4:29 PM  Result Value Ref Range Status   Specimen Description BLOOD BLOOD RIGHT FOREARM  Final   Special Requests BOTTLES DRAWN AEROBIC AND ANAEROBIC EA  Final   Culture  Setup Time   Final    GRAM POSITIVE COCCI IN CLUSTERS IN BOTH AEROBIC AND ANAEROBIC BOTTLES CRITICAL RESULT CALLED TO, READ BACK BY AND VERIFIED WITH: E JACKSON,PHARMD AT 2050 06/05/16 BY Geoffery SpruceV WILKINS [facility]    Culture STAPHYLOCOCCUS AUREUS (A)  Final   Report Status PENDING  Incomplete  Blood Culture ID Panel (Reflexed)     Status: Abnormal   Collection Time: 06/04/16  4:29 PM  Result Value Ref Range Status   Enterococcus species NOT DETECTED NOT DETECTED Final   Listeria monocytogenes NOT DETECTED NOT DETECTED Final   Staphylococcus species DETECTED (A) NOT DETECTED Final    Comment: CRITICAL RESULT CALLED TO, READ BACK BY AND VERIFIED WITH: E.JACKSON,PHARMD 06/05/16 @2050  BY V.WILKINS    Staphylococcus aureus DETECTED (A) NOT DETECTED Final    Comment: CRITICAL RESULT CALLED TO, READ BACK BY AND VERIFIED WITH: E.JACKSON,PHARMD 06/05/16 @2050  BY V.WILKINS    Methicillin resistance NOT DETECTED NOT DETECTED Final   Streptococcus species NOT DETECTED NOT DETECTED Final   Streptococcus agalactiae NOT DETECTED NOT DETECTED Final   Streptococcus pneumoniae NOT DETECTED NOT DETECTED Final   Streptococcus pyogenes NOT DETECTED  NOT DETECTED Final   Acinetobacter baumannii NOT DETECTED NOT DETECTED Final   Enterobacteriaceae species NOT DETECTED NOT DETECTED Final   Enterobacter cloacae complex NOT DETECTED NOT DETECTED Final   Escherichia coli NOT DETECTED NOT DETECTED Final   Klebsiella oxytoca NOT DETECTED NOT DETECTED Final   Klebsiella pneumoniae NOT DETECTED NOT DETECTED Final   Proteus species NOT DETECTED NOT DETECTED Final   Serratia marcescens NOT DETECTED NOT DETECTED Final   Haemophilus influenzae NOT DETECTED NOT DETECTED Final   Neisseria meningitidis NOT DETECTED NOT DETECTED Final   Pseudomonas aeruginosa NOT DETECTED NOT DETECTED Final   Candida albicans NOT DETECTED NOT DETECTED Final   Candida glabrata NOT DETECTED NOT DETECTED Final   Candida krusei NOT DETECTED NOT DETECTED Final   Candida parapsilosis NOT DETECTED NOT DETECTED Final   Candida tropicalis NOT DETECTED NOT DETECTED Final    Comment: Performed [facility]  Culture, blood (Routine X 2) w Reflex to ID Panel     Status: None (Preliminary result)   Collection Time: 06/04/16  5:45 PM  Result Value Ref Range Status   Specimen Description BLOOD RIGHT ANTECUBITAL  Final   Special Requests BOTTLES DRAWN AEROBIC AND ANAEROBIC 5ML  Final   Culture PENDING  Incomplete   Report Status PENDING  Incomplete  MRSA PCR Screening     Status: None   Collection Time: 06/05/16  6:30 AM  Result Value Ref Range Status   MRSA by PCR NEGATIVE NEGATIVE Final    Comment:        The GeneXpert MRSA Assay (FDA approved for NASAL specimens only), is one component of a comprehensive MRSA colonization surveillance program. It is not intended to diagnose MRSA infection nor to guide or monitor treatment for MRSA infections.          Radiology Studies: Dg Chest 2 View  Result Date: 06/04/2016 CLINICAL DATA:  55 year old male with history of cough and fever for the past 4 days. Possible sepsis. EXAM: CHEST  2 VIEW COMPARISON:  Chest  x-ray 06/09/2015. FINDINGS: Emphysematous changes are again noted throughout the lungs bilaterally, most notable in the lung apices. No confluent consolidative airspace disease. Slight coarsening of interstitial markings throughout the mid to lower lungs bilaterally is chronic and similar to the prior examination. Bibasilar linear opacities favored  to reflect areas of chronic scarring and/or subsegmental atelectasis. No pleural effusions. No evidence of pulmonary edema. Heart size is normal. The patient is rotated to the right on today's exam, resulting in distortion of the mediastinal contours and reduced diagnostic sensitivity and specificity for mediastinal pathology. IMPRESSION: 1. Chronic changes in the lungs suggestive of underlying COPD redemonstrated, as above. No definite radiographic evidence of acute cardiopulmonary disease. Electronically Signed   By: Trudie Reedaniel  Entrikin M.D.   On: 06/04/2016 16:41   Dg Foot Complete Right  Result Date: 06/05/2016 CLINICAL DATA:  Continued pain and foul odor from a nonhealing wound in the lateral aspect of the right foot at the fifth MTP joint. EXAM: RIGHT FOOT COMPLETE - 3+ VIEW COMPARISON:  05/06/2016. FINDINGS: Mildly progressive bone destruction involving the fifth metatarsal head. There is also dorsal dislocation of the fifth toe at the level of the MTP joint. Associated soft tissue swelling. No soft tissue gas. Stable bone island in the second metatarsal. IMPRESSION: 1. Mildly progressive changes of osteomyelitis involving the fifth metatarsal head. 2. Dorsal dislocation of the fifth toe at the MTP joint. Electronically Signed   By: Beckie SaltsSteven  Reid M.D.   On: 06/05/2016 11:16        Scheduled Meds: . arformoterol  15 mcg Nebulization BID  . aspirin  81 mg Oral Daily  . budesonide (PULMICORT) nebulizer solution  0.25 mg Nebulization BID  . cefTRIAXone (ROCEPHIN)  IV  2 g Intravenous Q24H  . chlorhexidine  60 mL Topical Once  . clonazePAM  1 mg Oral QHS    . dolutegravir  50 mg Oral Daily  . emtricitabine-tenofovir AF  1 tablet Oral Daily  . enoxaparin (LOVENOX) injection  40 mg Subcutaneous Q24H  . feeding supplement (ENSURE ENLIVE)  237 mL Oral BID BM  . guaiFENesin  600 mg Oral BID  . insulin aspart  0-5 Units Subcutaneous QHS  . insulin aspart  0-9 Units Subcutaneous TID WC  . ipratropium-albuterol  3 mL Nebulization TID  . metoprolol tartrate  50 mg Oral BID  . metoprolol tartrate  50 mg Oral Once  . [START ON 06/07/2016] metoprolol tartrate  50 mg Oral BID  . metroNIDAZOLE  500 mg Oral Q8H  . morphine  30 mg Oral Q12H  . multivitamin with minerals  1 tablet Oral Daily  . nicotine  21 mg Transdermal Daily  . nortriptyline  150 mg Oral QHS  . sodium chloride flush  3 mL Intravenous Q12H  . spironolactone  25 mg Oral Daily  . vancomycin  1,250 mg Intravenous Q12H   Continuous Infusions:   LOS: 2 days    Time spent: 35 minutes    Kamrie Fanton, MD [facility] Pager [phone]  If 7PM-7AM, please contact night-coverage [email or web address] Password [email or web address] 06/06/2016, 11:30 AM

## 2016-06-06 NOTE — Anesthesia Procedure Notes (Addendum)
Anesthesia Regional Block:  Popliteal block  Pre-Anesthetic Checklist: ,, timeout performed, Correct Patient, Correct Site, Correct Laterality, Correct Procedure, Correct Position, site marked, Risks and benefits discussed,  Surgical consent,  Pre-op evaluation,  At surgeon's request and post-op pain management  Laterality: Right  Prep: chloraprep       Needles:  Injection technique: Single-shot  Needle Type: Echogenic Needle     Needle Length: 9cm 9 cm Needle Gauge: 21 and 21 G    Additional Needles:  Procedures: ultrasound guided (picture in chart) Popliteal block Narrative:  Start time: 06/06/2016 5:50 PM End time: 06/06/2016 5:55 PM Injection made incrementally with aspirations every 5 mL.  Performed by: Personally  Anesthesiologist: Cecile HearingURK, STEPHEN EDWARD  Additional Notes: No pain on injection. No increased resistance to injection. Injection made in 5cc increments.  Good needle visualization.  Patient tolerated procedure well. Combined right popliteal/saphenous nerve block.

## 2016-06-06 NOTE — Progress Notes (Signed)
Pharmacy Antibiotic Note  [name] is a 55 y.o. male with PMH of admitted on 06/04/2016 with right foot ulcer/osteomyelitis, to undergo right foot fifth ray amputation today. Patient initially placed on Vancomycin, Ceftriaxone, and Metronidazole, which is now narrowed to Cefazolin per ID as blood culture growing MSSA. Pharmacy has been consulted for dosing of Cefazolin.  Plan: Cefazolin 2g IV q8h. Monitor renal function, cultures, clinical course.   Height: 6\' 2"  (188 cm) Weight: 205 lb (93 kg) IBW/kg (Calculated) : 82.2  Temp (24hrs), Avg:98.1 F (36.7 C), Min:97.6 F (36.4 C), Max:98.6 F (37 C)   Recent Labs Lab 06/04/16 1640 06/04/16 1646 06/04/16 2024 06/05/16 0551 06/06/16 0520  WBC 8.3  --   --  4.0 2.9*  CREATININE 1.00  --   --  0.90 0.81  LATICACIDVEN  --  1.80 0.85  --   --     Estimated Creatinine Clearance: 118.4 mL/min (by C-G formula based on SCr of 0.81 mg/dL).    Allergies  Allergen Reactions  . Tetracycline Hcl Swelling    Lips, facial swelling    Antimicrobials this admission: 12/2 >> Vancomycin >> 12/4 12/3 >> Ceftriaxone >> 12/4 12/3 >> Metronidazole >> 12/4 12/4 >> Cefazolin >>  Dose adjustments this admission: --  Microbiology results: 12/2 BCx: 1/2 sets with MSSA 12/3 MRSA PCR: negative  Thank you for allowing pharmacy to be a part of this patient's care.     Greer PickerelJigna Tyshaun Vinzant, PharmD, BCPS Pager: [phone] 06/06/2016 4:17 PM

## 2016-06-06 NOTE — Consult Note (Signed)
CARDIOLOGY CONSULT NOTE       Patient ID: [name] MRN: [id number] DOB/AGE: 1960-10-30 55 y.o.  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 with [name] , history of VT. Cath 09/2013 with no CAD and MRI with no scar. Supposed to be on beta blocker per EP but patient stopped last year because it Made his HR too slow. Gets weakness, palpitations and presyncope with episodes. Has not had sustained episode in long time. Most recent eco with EF 45% 08/31/15. History of MVP and MR only mild on last echo. No chest pain no syncope no dyspnea. Has had infected right foot for 2 months and is supposed to have amputation 5:00 pm today  ROS All other systems reviewed and negative except as noted above  Past Medical History:  Diagnosis Date  . Cholesteatoma   . 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 COPD (chronic obstructive pulmonary disease) (HCC)   . Depression   . Folliculitis barbae 12/08/2014  . GERD (gastroesophageal reflux disease) 06/09/2015  . HIV (human immunodeficiency virus infection) (HCC)   . ILD (interstitial lung disease) (HCC) 12/08/2014  . Marfan syndrome    Dr Ninetta LightsHatcher follows  . Mitral valve prolapse   . Rhinosinusitis 12/08/2014  . Venous insufficiency (chronic) (peripheral)   . VT (ventricular tachycardia) (HCC)     Family History  Problem Relation Age of Onset  .  Cancer Mother   . Hypertension Mother   . Stroke Sister   . Hypertension Sister   . Aneurysm Sister     Social History   Social History  . Marital status: Single    Spouse name: [name]  . Number of children: [name]  . Years of education: [name]   Occupational History  . Not on file.   Social History Main Topics  . Smoking status: Current Some Day Smoker    Packs/day: 0.25    Years: 35.00    Types: Cigarettes  . Smokeless tobacco: Never Used     Comment: trying to cut back  . Alcohol use No  . Drug use: No  . Sexual activity: No     Comment: pt. declined condoms   Other Topics Concern  . Not on file   Social History Narrative  . No narrative on file    Past Surgical History:  Procedure Laterality Date  . APPENDECTOMY    . cholesteotoma removal    . LEFT HEART CATHETERIZATION WITH CORONARY ANGIOGRAM [name] 09/24/2013   Procedure: LEFT HEART CATHETERIZATION WITH CORONARY ANGIOGRAM;  Surgeon: Marykay Lexavid W Harding, MD;  [facility];  Service: Cardiovascular;  Laterality: [name];  . repair of perforated colon       . aspirin  81 mg Oral Daily  . cefTRIAXone (ROCEPHIN)  IV  2 g Intravenous Q24H  . chlorhexidine  60 mL Topical Once  . clonazePAM  1 mg Oral QHS  .  dolutegravir  50 mg Oral Daily  . emtricitabine-tenofovir AF  1 tablet Oral Daily  . enoxaparin (LOVENOX) injection  40 mg Subcutaneous Q24H  . feeding supplement (ENSURE ENLIVE)  237 mL Oral BID BM  . fluticasone furoate-vilanterol  1 puff Inhalation Daily  . guaiFENesin  600 mg Oral BID  . insulin aspart  0-5 Units Subcutaneous QHS  . insulin aspart  0-9 Units Subcutaneous TID WC  . ipratropium-albuterol  3 mL Nebulization TID  . metoprolol tartrate  50 mg Oral BID  . metoprolol tartrate  50 mg Oral Once  . [START ON 06/07/2016] metoprolol tartrate  50 mg Oral BID  . metroNIDAZOLE  500 mg Oral Q8H  . morphine  30 mg Oral Q12H  . multivitamin with minerals  1 tablet Oral Daily  . nicotine  21 mg Transdermal Daily    . nortriptyline  150 mg Oral QHS  . sodium chloride flush  3 mL Intravenous Q12H  . spironolactone  25 mg Oral Daily  . vancomycin  1,250 mg Intravenous Q12H     Physical Exam: Blood pressure (!) 107/59, pulse 82, temperature 97.9 F (36.6 C), temperature source Oral, resp. rate 19, height 6\' 2"  (1.88 m), weight 93 kg (205 lb), SpO2 90 %.    Affect appropriate Marfanoid UE;s  HEENT: normal Neck supple with no adenopathy JVP normal no bruits no thyromegaly Lungs clear with no wheezing and good diaphragmatic motion Heart:  S1/S2 loud apical MR murmur, no rub, gallop or click PMI normal Abdomen: benighn, BS positve, no tenderness, no AAA no bruit.  No HSM or HJR Distal pulses intact with no bruits Plus 2  Edema with stasis and osteomyelitis right foot  Neuro non-focal Skin warm and dry No muscular weakness   Labs:   Lab Results  Component Value Date   WBC 2.9 (L) 06/06/2016   HGB 10.4 (L) 06/06/2016   HCT 32.5 (L) 06/06/2016   MCV 88.3 06/06/2016   PLT 161 06/06/2016    Recent Labs Lab 06/05/16 0551 06/06/16 0520  NA 133* 135  K 3.5 3.5  CL 101 100*  CO2 25 27  BUN 15 16  CREATININE 0.90 0.81  CALCIUM 8.7* 8.7*  PROT 6.9  --   BILITOT 1.0  --   ALKPHOS 74  --   ALT 10*  --   AST 11*  --   GLUCOSE 87 108*   Lab Results  Component Value Date   CKTOTAL 23 01/23/2011   CKMB 1.1 01/23/2011   TROPONINI <0.30 09/24/2013    Lab Results  Component Value Date   CHOL 144 02/12/2016   CHOL 157 10/24/2014   CHOL 148 09/24/2013   Lab Results  Component Value Date   HDL 43 02/12/2016   HDL 30 (L) 10/24/2014   HDL 41 09/24/2013   Lab Results  Component Value Date   LDLCALC 86 02/12/2016   LDLCALC 98 10/24/2014   LDLCALC 83 09/24/2013   Lab Results  Component Value Date   TRIG 76 02/12/2016   TRIG 145 10/24/2014   TRIG 121 09/24/2013   Lab Results  Component Value Date   CHOLHDL 3.3 02/12/2016   CHOLHDL 5.2 10/24/2014   CHOLHDL 3.6 09/24/2013    No results found for: LDLDIRECT    Radiology: Dg Chest 2 View  Result Date: 06/04/2016 CLINICAL DATA:  55 year old male with history of cough and fever for the past 4 days. Possible sepsis. EXAM: CHEST  2 VIEW COMPARISON:  Chest x-ray  06/09/2015. FINDINGS: Emphysematous changes are again noted throughout the lungs bilaterally, most notable in the lung apices. No confluent consolidative airspace disease. Slight coarsening of interstitial markings throughout the mid to lower lungs bilaterally is chronic and similar to the prior examination. Bibasilar linear opacities favored to reflect areas of chronic scarring and/or subsegmental atelectasis. No pleural effusions. No evidence of pulmonary edema. Heart size is normal. The patient is rotated to the right on today's exam, resulting in distortion of the mediastinal contours and reduced diagnostic sensitivity and specificity for mediastinal pathology. IMPRESSION: 1. Chronic changes in the lungs suggestive of underlying COPD redemonstrated, as above. No definite radiographic evidence of acute cardiopulmonary disease. Electronically Signed   By: Trudie Reed M.D.   On: 06/04/2016 16:41   Mr Foot Right W Wo Contrast  Result Date: 05/23/2016 CLINICAL DATA:  Right foot ulcer will not heal for 3 months. EXAM: MRI OF THE RIGHT FOREFOOT WITHOUT AND WITH CONTRAST TECHNIQUE: Multiplanar, multisequence MR imaging was performed both before and after administration of intravenous contrast. CONTRAST:  20mL MULTIHANCE GADOBENATE DIMEGLUMINE 529 MG/ML IV SOLN COMPARISON:  None. FINDINGS: Bones/Joint/Cartilage Soft tissue ulcer overlying the fifth MTP joint along the plantar aspect. Underlying cortical erosion of the plantar aspect of the fifth metatarsal head with surrounding marrow edema and mild enhancement most consistent with osteomyelitis. No other focal marrow signal abnormality. No other areas of bone destruction or periosteal reaction. No fracture or  dislocation. Normal alignment. No joint effusion. Ligaments Collateral ligaments are intact. Muscles and Tendons Flexor, peroneal and extensor compartment tendons are intact. Soft tissue No fluid collection or hematoma.  No soft tissue mass. IMPRESSION: 1. Soft tissue ulcer overlying the plantar aspect of the fifth MTP joint. Mild osteomyelitis of the plantar aspect of the fifth metatarsal head. No focal fluid collection to suggest an abscess. Electronically Signed   By: Elige Ko   On: 05/23/2016 08:06   Dg Foot Complete Right  Result Date: 06/05/2016 CLINICAL DATA:  Continued pain and foul odor from a nonhealing wound in the lateral aspect of the right foot at the fifth MTP joint. EXAM: RIGHT FOOT COMPLETE - 3+ VIEW COMPARISON:  05/06/2016. FINDINGS: Mildly progressive bone destruction involving the fifth metatarsal head. There is also dorsal dislocation of the fifth toe at the level of the MTP joint. Associated soft tissue swelling. No soft tissue gas. Stable bone island in the second metatarsal. IMPRESSION: 1. Mildly progressive changes of osteomyelitis involving the fifth metatarsal head. 2. Dorsal dislocation of the fifth toe at the MTP joint. Electronically Signed   By: Beckie Salts M.D.   On: 06/05/2016 11:16    EKG: SR minor IVCD PR 208 msec no acute ST changes QT 430   ASSESSMENT AND PLAN:  VT: Telemetry with short asymptomatic burst NSVT less than 10 beats  Resume Beta blocker 50 lopressor bid and add iv amiodarone in periop phase. Given fairly good EF and no CAD by history and cath 09/2013 ok to proceed with amputation of foot.    MR:  Murmur is very loud given [name] should have some prolapse will review echo as indicated that MR only mild Needs updated echo for EF and degree of MR  Osteomyelitis:  On ceftriaxone amputation latter today   Signed: Charlton Haws 06/06/2016, 11:27 AM

## 2016-06-06 NOTE — Anesthesia Postprocedure Evaluation (Signed)
Anesthesia Post Note  [name]  Procedure(s) Performed: Procedure(s) (LRB): AMPUTATION RAY RIGHT FIFTH RAY (Right)  Patient location during evaluation: PACU Anesthesia Type: MAC and Regional Level of consciousness: awake and alert Pain management: pain level controlled Vital Signs Assessment: post-procedure vital signs reviewed and stable Respiratory status: spontaneous breathing, nonlabored ventilation, respiratory function stable and patient connected to nasal cannula oxygen Cardiovascular status: stable and blood pressure returned to baseline Anesthetic complications: no    Last Vitals:  Vitals:   06/06/16 2002 06/06/16 2012  BP:  (!) 98/58  Pulse: 64 65  Resp: 16 20  Temp:  36.6 C    Last Pain:  Vitals:   06/06/16 1503  TempSrc: Oral  PainSc:                  [name]

## 2016-06-06 NOTE — Progress Notes (Signed)
PCF central telemetry to report a 19 beat run of V-tach. The pt. Was asleep in bed, when I woke him up he stated he felt fine except for his cough. Paged the on call Dr. To request something for his cough and to let them know about the 19 beat run.

## 2016-06-06 NOTE — Interval H&P Note (Signed)
History and Physical Interval Note:  06/06/2016 6:53 PM  [name]  has presented today for surgery, with the diagnosis of osteomyelitis right foot  The various methods of treatment have been discussed with the patient and family. After consideration of risks, benefits and other options for treatment, the patient has consented to  Procedure(s): AMPUTATION RAY RIGHT FIFTH RAY (Right) as a surgical intervention .  The patient's history has been reviewed, patient examined, no change in status, stable for surgery.  I have reviewed the patient's chart and labs.  Questions were answered to the patient's satisfaction.     Nadara MustardMarcus V Kaoir Loree

## 2016-06-06 NOTE — Progress Notes (Signed)
Pt prepared for surgery, CHG baths completed, ABx on call to surgery. denies pain, agree with current assessment not changes notes. Awaits for surgery transport.

## 2016-06-06 NOTE — H&P (View-Only) (Signed)
ORTHOPAEDIC CONSULTATION  REQUESTING PHYSICIAN: Rodolph Bonganiel V Thompson, MD  Chief Complaint: Osteomyelitis ulceration right foot fifth metatarsal head.  HPI: [name] is a 55 y.o. 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 Patient states that he has [facility] with Dr. Meyer RusselBritto with serial debridement of the ulcer right fifth metatarsal head.  Past Medical History:  Diagnosis Date  . Cholesteatoma   . Chronic hepatitis B (HCC)   . 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 COPD (chronic obstructive pulmonary disease) (HCC)   . COPD exacerbation (HCC) 12/08/2014  . Depression   . Folliculitis barbae 12/08/2014  . GERD (gastroesophageal reflux disease) 06/09/2015  . HIV (human immunodeficiency virus infection) (HCC)   . ILD (interstitial lung disease) (HCC) 12/08/2014  . Marfan syndrome    Dr Ninetta LightsHatcher follows  . Mitral valve prolapse   . Rhinosinusitis 12/08/2014  . Venous insufficiency (chronic) (peripheral)   . VT (ventricular tachycardia) (HCC)    Past Surgical History:  Procedure Laterality Date  . APPENDECTOMY    . cholesteotoma removal    . LEFT HEART CATHETERIZATION WITH CORONARY ANGIOGRAM [name] 09/24/2013   Procedure: LEFT HEART CATHETERIZATION WITH CORONARY ANGIOGRAM;  Surgeon: Marykay Lexavid W Harding, MD;  [facility];  Service: Cardiovascular;  Laterality: [name];  . repair of perforated colon     Social History   Social History  . Marital status: Single    Spouse name: [name]  . Number of children: [name]  . Years of education: [name]   Social History Main Topics  . Smoking status: Current Some Day Smoker    Packs/day: 0.25    Years: 35.00    Types: Cigarettes  . Smokeless tobacco: Never Used     Comment: trying to cut back  . Alcohol use No  . Drug use: No  . Sexual activity: No     Comment: pt. declined condoms   Other Topics Concern  . None    Social History Narrative  . None   Family History  Problem Relation Age of Onset  . Cancer Mother   . Hypertension Mother   . Stroke Sister   . Hypertension Sister   . Aneurysm Sister    - negative except otherwise stated in the family history section Allergies  Allergen Reactions  . Tetracycline Hcl Swelling    Lips, facial swelling   Prior to Admission medications   Medication Sig Start Date End Date Taking? Authorizing Provider  ALPRAZolam Prudy Feeler(XANAX) 0.5 MG tablet Take 0.5 mg by mouth 3 (three) times daily as needed for anxiety.   Yes Historical Provider, MD  aspirin 81 MG chewable tablet Chew 81 mg by mouth daily.   Yes Historical Provider, MD  clonazePAM (KLONOPIN) 1 MG tablet Take 1 mg by mouth at bedtime.   Yes Historical Provider, MD  DESCOVY 200-25 MG tablet TAKE 1 TABLET BY MOUTH DAILY. 06/03/16  Yes Ginnie SmartJeffrey C Hatcher, MD  furosemide (LASIX) 40 MG tablet TAKE 1/2 TABLET (20 MG TOTAL) BY BY MOUTH EVERY MORNING. 04/27/16  Yes Ginnie SmartJeffrey C Hatcher, MD  Morphine Sulfate 40 MG CP24 Take 40 mg by mouth 2 (two) times daily. 05/10/16  Yes Judyann Munsonynthia Snider, MD  Multiple Vitamins-Minerals (MULTIVITAMIN WITH MINERALS) tablet Take 1 tablet by mouth daily.   Yes Historical Provider, MD  nortriptyline (PAMELOR) 75 MG capsule Take 150 mg by mouth at bedtime.   Yes Historical  Provider, MD  Oil of Oregano 1500 MG CAPS Take 1 capsule by mouth 3 (three) times a week.   Yes Historical Provider, MD  promethazine (PHENERGAN) 25 MG tablet TAKE 1 TABLET BY MOUTH EVERY 8 HOURS AS NEEDED. 06/08/15  Yes Ginnie Smart, MD  spironolactone (ALDACTONE) 25 MG tablet TAKE 1 TABLET BY MOUTH ONCE DAILY. 05/05/16  Yes Ginnie Smart, MD  TIVICAY 50 MG tablet TAKE 1 TABLET BY MOUTH DAILY. 06/03/16  Yes Ginnie Smart, MD  silver sulfADIAZINE (SILVADENE) 1 % cream Apply 1 application topically daily. Patient not taking: Reported on 06/04/2016 03/09/16   Carrington Clamp, DPM  sulfamethoxazole-trimethoprim  (BACTRIM DS,SEPTRA DS) 800-160 MG tablet TAKE 1 TABLET BY MOUTH 3 TIMES A WEEK. Patient not taking: Reported on 06/04/2016 11/18/15   Ginnie Smart, MD   Dg Chest 2 View  Result Date: 06/04/2016 CLINICAL DATA:  55 year old male with history of cough and fever for the past 4 days. Possible sepsis. EXAM: CHEST  2 VIEW COMPARISON:  Chest x-ray 06/09/2015. FINDINGS: Emphysematous changes are again noted throughout the lungs bilaterally, most notable in the lung apices. No confluent consolidative airspace disease. Slight coarsening of interstitial markings throughout the mid to lower lungs bilaterally is chronic and similar to the prior examination. Bibasilar linear opacities favored to reflect areas of chronic scarring and/or subsegmental atelectasis. No pleural effusions. No evidence of pulmonary edema. Heart size is normal. The patient is rotated to the right on today's exam, resulting in distortion of the mediastinal contours and reduced diagnostic sensitivity and specificity for mediastinal pathology. IMPRESSION: 1. Chronic changes in the lungs suggestive of underlying COPD redemonstrated, as above. No definite radiographic evidence of acute cardiopulmonary disease. Electronically Signed   By: Trudie Reed M.D.   On: 06/04/2016 16:41   - pertinent xrays, CT, MRI studies were reviewed and independently interpreted  Positive ROS: All other systems have been reviewed and were otherwise negative with the exception of those mentioned in the HPI and as above.  Physical Exam: General: Alert, no acute distress Psychiatric: Patient is competent for consent with normal mood and affect Lymphatic: No axillary or cervical lymphadenopathy Cardiovascular: No pedal edema Respiratory: No cyanosis, no use of accessory musculature GI: No organomegaly, abdomen is soft and non-tender  Skin: Examination patient has brawny skin color changes in both lower extremities consistent with venous insufficiency he states  he has had some venous ablation in the past. Patient has no open ulcers he has pitting edema in both legs. He does have a Wagner grade 3 ulcer beneath the fifth metatarsal head right foot with exposed necrotic bone.   Neurologic: Patient does not have protective sensation bilateral lower extremities.   MUSCULOSKELETAL:  Examination patient is alert oriented no adenopathy well-dressed normal affect, respiratory effort.  Patient has a strong dorsalis pedis pulse the Doppler shows triphasic flow. Patient's Doppler was negative for DVT. Examination of his foot he has exposed necrotic bone with Wagner grade 3 ulcer fifth metatarsal head. Previous MRI scan shows osteomyelitis.  Assessment: Assessment: Insensate neuropathy with Wagner grade 3 ulcer fifth metatarsal head right foot with chronic osteomyelitis fifth metatarsal head.  Of note patient states that he is not diabetic.   Patient's hemoglobin A1c is 5.5.   Plan: Plan: We'll plan for right foot fifth ray amputation tomorrow Monday 5 PM. Risks and benefits of surgery were discussed including persistent infection nonhealing the wound need for additional surgery. Patient states he understands and wishes to proceed at  this time. Anticipate he could be discharged to home postoperative once he is able to ambulate nonweightbearing right lower extremity.  Thank you for the consult and the opportunity to see [name]  Flornce Record, MD [facility] [phone] 11:00 AM

## 2016-06-06 NOTE — Op Note (Signed)
06/04/2016 - 06/06/2016  7:34 PM  PATIENT:  [name]    PRE-OPERATIVE DIAGNOSIS:  osteomyelitis right foot  POST-OPERATIVE DIAGNOSIS:  Same  PROCEDURE:  AMPUTATION RAY RIGHT FIFTH RAY Local tissue rearrangement for wound closure 3 x 7 cm  SURGEON:  Nadara MustardMarcus V Duda, MD  PHYSICIAN ASSISTANT:None ANESTHESIA:   General  PREOPERATIVE INDICATIONS:  [name] is a  55 y.o. male with a diagnosis of osteomyelitis right foot who failed conservative measures and elected for surgical management.    The risks benefits and alternatives were discussed with the patient preoperatively including but not limited to the risks of infection, bleeding, nerve injury, cardiopulmonary complications, the need for revision surgery, among others, and the patient was willing to proceed.  OPERATIVE IMPLANTS: None  OPERATIVE FINDINGS: Deep purulent abscess at the MTP joint this was cultured. There was no abscess adjacent to the soft tissue at the resection margins  OPERATIVE PROCEDURE: Patient brought to the operating room after undergoing a regional anesthetic the right lower extremity was prepped using DuraPrep draped into a sterile field a timeout was called. A racquet incision was made around the fifth toe to include the tissue involving the ulcer. This was carried down to healthy viable tissue there was a deep abscess at the MTP joint and cultures were obtained 2. The fifth metatarsal was resected through its base. The attachment. His brevis was still intact. Electrocautery was used for hemostasis. The wound was irrigated with normal saline. The tissue was healthy viable with good petechial bleeding. Local tissue rearrangement was used to close the wound 3 x 7 cm. A sterile compressive dressing was applied patient was taken to PACU in condition.

## 2016-06-06 NOTE — Transfer of Care (Signed)
Immediate Anesthesia Transfer of Care Note  Patient: [name]  Procedure(s) Performed: Procedure(s): AMPUTATION RAY RIGHT FIFTH RAY (Right)  Patient Location: PACU  Anesthesia Type:MAC  Level of Consciousness: awake, alert  and oriented  Airway & Oxygen Therapy: Patient Spontanous Breathing and Patient connected to face mask oxygen  Post-op Assessment: Report given to RN and Post -op Vital signs reviewed and stable  Post vital signs: Reviewed and stable  Last Vitals:  Vitals:   06/06/16 1702 06/06/16 1938  BP: 90/60 (!) 86/60  Pulse: 67   Resp:    Temp:      Last Pain:  Vitals:   06/06/16 1503  TempSrc: Oral  PainSc:       Patients Stated Pain Goal: 1 (06/05/16 2200)  Complications: No apparent anesthesia complications

## 2016-06-07 ENCOUNTER — Encounter (HOSPITAL_COMMUNITY): Payer: Self-pay | Admitting: Orthopedic Surgery

## 2016-06-07 DIAGNOSIS — B9561 Methicillin susceptible Staphylococcus aureus infection as the cause of diseases classified elsewhere: Secondary | ICD-10-CM | POA: Diagnosis present

## 2016-06-07 DIAGNOSIS — R7881 Bacteremia: Secondary | ICD-10-CM

## 2016-06-07 DIAGNOSIS — Z89421 Acquired absence of other right toe(s): Secondary | ICD-10-CM

## 2016-06-07 LAB — CBC WITH DIFFERENTIAL/PLATELET
BASOS ABS: 0 10*3/uL (ref 0.0–0.1)
Basophils Relative: 0 %
Eosinophils Absolute: 0.1 10*3/uL (ref 0.0–0.7)
Eosinophils Relative: 1 %
HEMATOCRIT: 33.5 % — AB (ref 39.0–52.0)
Hemoglobin: 10.9 g/dL — ABNORMAL LOW (ref 13.0–17.0)
LYMPHS ABS: 0.9 10*3/uL (ref 0.7–4.0)
LYMPHS PCT: 19 %
MCH: 28.4 pg (ref 26.0–34.0)
MCHC: 32.5 g/dL (ref 30.0–36.0)
MCV: 87.2 fL (ref 78.0–100.0)
MONO ABS: 0.3 10*3/uL (ref 0.1–1.0)
Monocytes Relative: 6 %
NEUTROS ABS: 3.6 10*3/uL (ref 1.7–7.7)
Neutrophils Relative %: 74 %
Platelets: 199 10*3/uL (ref 150–400)
RBC: 3.84 MIL/uL — AB (ref 4.22–5.81)
RDW: 14.9 % (ref 11.5–15.5)
WBC: 4.9 10*3/uL (ref 4.0–10.5)

## 2016-06-07 LAB — BASIC METABOLIC PANEL
Anion gap: 8 (ref 5–15)
BUN: 12 mg/dL (ref 6–20)
CHLORIDE: 105 mmol/L (ref 101–111)
CO2: 23 mmol/L (ref 22–32)
Calcium: 9.1 mg/dL (ref 8.9–10.3)
Creatinine, Ser: 0.74 mg/dL (ref 0.61–1.24)
GFR calc Af Amer: >60 mL/min (ref >60)
GFR calc non Af Amer: >60 mL/min (ref >60)
Glucose, Bld: 84 mg/dL (ref 65–99)
POTASSIUM: 4.2 mmol/L (ref 3.5–5.1)
SODIUM: 136 mmol/L (ref 135–145)

## 2016-06-07 LAB — GLUCOSE, CAPILLARY
GLUCOSE-CAPILLARY: 138 mg/dL — AB (ref 65–99)
GLUCOSE-CAPILLARY: 85 mg/dL (ref 65–99)
GLUCOSE-CAPILLARY: 87 mg/dL (ref 65–99)
Glucose-Capillary: 74 mg/dL (ref 65–99)

## 2016-06-07 LAB — CULTURE, BLOOD (ROUTINE X 2)

## 2016-06-07 LAB — HIV-1 RNA QUANT-NO REFLEX-BLD
HIV 1 RNA Quant: <20 copies/mL
LOG10 HIV-1 RNA: UNDETERMINED {Log_copies}/mL

## 2016-06-07 LAB — MAGNESIUM: MAGNESIUM: 2 mg/dL (ref 1.7–2.4)

## 2016-06-07 MED ORDER — NORTRIPTYLINE HCL 25 MG PO CAPS: 150.0000 mg | ORAL_CAPSULE | Freq: Every day | ORAL | Status: DC

## 2016-06-07 MED ORDER — NORTRIPTYLINE HCL 25 MG PO CAPS
50.0000 mg | ORAL_CAPSULE | Freq: Every day | ORAL | Status: DC
  Administered 2016-06-07 – 2016-06-10 (×4): 50 mg via ORAL
  Filled 2016-06-07 (×4): qty 2

## 2016-06-07 MED ORDER — NORTRIPTYLINE HCL 25 MG PO CAPS: 50.0000 mg | ORAL_CAPSULE | Freq: Every day | ORAL | Status: DC

## 2016-06-07 MED ORDER — ALPRAZOLAM 0.5 MG PO TABS: 0.5000 mg | ORAL_TABLET | Freq: Three times a day (TID) | ORAL | Status: DC | PRN

## 2016-06-07 NOTE — Progress Notes (Signed)
PROGRESS NOTE    [name]  [name]:[id number] DOB: 09-Nov-1960 DOA: 06/04/2016 PCP: Johny Sax, MD    Brief Narrative:  55 y.o. 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 Patient presented with right foot ulcer with some cellulitis. Patient currently afebrile. Patient has been seen in consultation by Dr. Lajoyce Corners of orthopedics was recommended a right foot fifth ray amputation 06/06/2016. Lower extremity Dopplers negative for DVT.  Patient status post amputation right fifth ray per Dr. Lajoyce Corners 06/06/2016. IV antibiotics have a narrowed down to IV Ancef. Patient has been seen in consultation by infectious disease who have narrowed IV antibiotics to IV Ancef from IV vancomycin, IV Rocephin and oral Flagyl. Patient noted to have a MSSA bacteremia .Repeat blood cultures have been ordered for today 06/07/2016 and currently pending. The ID if blood cultures are negative 48 hours them place PICC line. 2-D echo negative for any source of endocarditis. ID recommended TE however per cardiology if repeat blood cultures are positive then may get a TEE. Per orthopedics outpatient  follow-up in 1 week. Appreciate ID input and recommendations. Patient has been seen by cardiology due to runs of V. tach.  Appreciate orthopedics and cardiology's input.   #2 V. tach Patient is noted to have several bursts of asymptomatic nonsustained V. tach 1 approximately 19 beats. EKG done shows a sinus rhythm ER interval of 208 with no acute ischemic changes. Patient has been started on Lopressor 50 mg twice daily and placed on amiodarone drip in the preoperative phase per cardiology. Per cardiology amiodarone may be discontinued now postsurgery. Per cardiology on discharge probably discharge patient home on Lopressor 25 mg twice a day. Follow.  #3 MSSA bacteremia Likely seeded from toe infection/osteomyelitis. 2-D echo negative for vegetations. ID following and feel patient needs a chance esophageal echocardiogram. Case was discussed with Dr. Eden Emms of cardiology who feels that if repeat blood cultures, back positive then patient may need a TEE at that time. Repeat blood cultures have been ordered and if negative after 48 hours placed PICC line. Continue empiric IV Ancef.  #4?? Hyperlipidemia versus diabetes. Last hemoglobin A1c was 5.5 on 03/25/2009. Hemoglobin A1c = 5.3. Doubt if patient has diabetes as his A1c is 5.3. CBGs have ranged from 70-108. D/C Sliding scale insulin.  #5 COPD/ILD Currently stable. Continue Pulmicort and Brovana.  #6 HIV CD4 count pending. Viral load 30. Continue current antiretroviral treatment is recommended per ID.  #7 depression Stable. Pamelor on hold secondary to drug interaction with amiodarone. Either resume Pamelor and one week or resume at a decreased dose and uptitrated back to home dose.    DVT prophylaxis: Lovenox Code Status: Full Family Communication:  Updated patient. No family at bedside. Disposition Plan: Home with home health versus SNF pending amputation and PT evaluation.   Consultants:   Orthopedics: Dr. Lajoyce Corners 06/05/2016  Infectious  disease: Dr. Ninetta Lights 06/05/2016  Cardiology: Dr. Eden Emms 06/06/2016.  Procedures:   Lower extremity Dopplers 06/05/2016  2-D echo 06/06/2016  Amputation ray right fifth ray per Dr.Duda 06/06/2016  Antimicrobials:   IV vancomycin 06/04/2016  IV Rocephin 06/05/2016  Oral Flagyl 06/05/2016   Subjective: Patient states feeling better than on admission. No chest pain. No shortness of breath. Patient sitting up in chair. Patient had low upset is not going home today. No further runs of V. tach.   Objective: Vitals:   06/07/16 0504 06/07/16 0807 06/07/16 0837 06/07/16 1134  BP: 110/81 103/67  110/64  Pulse: 72 76  69  Resp: 20 20  20   Temp: 97.7 F (36.5 C) 98 F (36.7 C)  98.1 F (36.7 C)  TempSrc: Oral Oral  Oral  SpO2: 97% 97% 95% 98%  Weight:      Height:        Intake/Output Summary (Last 24 hours) at 06/07/16 1148 Last data filed at 06/07/16 0600  Gross per 24 hour  Intake          1819.92 ml  Output               50 ml  Net          1769.92 ml   Filed Weights   06/04/16 1825 06/04/16 2100  Weight: 93.1 kg (205 lb 3 oz) 93 kg (205 lb)    Examination:  General exam: Appears calm and comfortable  Respiratory system: Clear to auscultation. Respiratory effort normal. Cardiovascular system: S1 & S2 heard, RRR. Loud 3/6 SEM LUSB and LLSB. No JVD, murmurs, rubs, gallops or clicks. No pedal edema. Gastrointestinal system: Abdomen is nondistended, soft and nontender. No organomegaly or masses felt. Normal bowel sounds heard. Central nervous system: Alert and oriented. No focal neurological deficits. Extremities: 2+ pulses. Right lower extremity bandage post op shoe.  Skin: No rashes, lesions or ulcers Psychiatry: Judgement and insight appear normal. Mood & affect appropriate.     Data Reviewed: I have personally reviewed following labs and imaging studies  CBC:  Recent Labs Lab 06/04/16 1640 06/05/16 0551 06/06/16 0520 06/07/16 0417  WBC 8.3 4.0 2.9*  4.9  NEUTROABS 7.1  --   --  3.6  HGB 12.2* 10.6* 10.4* 10.9*  HCT 36.9* 32.8* 32.5* 33.5*  MCV 86.4 87.9 88.3 87.2  PLT 202 152 161 199   Basic Metabolic Panel:  Recent Labs Lab 06/04/16 1640 06/05/16 0551 06/06/16 0520 06/07/16 0417  NA 132* 133* 135 136  K 3.7 3.5 3.5 4.2  CL 99* 101 100* 105  CO2 22 25 27 23   GLUCOSE 124* 87 108* 84  BUN 16 15 16 12   CREATININE 1.00 0.90 0.81 0.74  CALCIUM 9.4 8.7* 8.7* 9.1  MG  --  1.7 2.2 2.0  PHOS  --  3.3  --   --    GFR: Estimated Creatinine Clearance: 119.9 mL/min (by C-G formula based on SCr of 0.74 mg/dL). Liver Function Tests:  Recent Labs Lab 06/04/16 1640 06/05/16 0551  AST 17 11*  ALT 12* 10*  ALKPHOS 94 74  BILITOT 1.3* 1.0  PROT 8.4* 6.9  ALBUMIN 3.7 3.0*   No results for input(s): LIPASE, AMYLASE in the last 168 hours. No results for input(s): AMMONIA in the last 168 hours. Coagulation  Profile: No results for input(s): INR, PROTIME in the last 168 hours. Cardiac Enzymes: No results for input(s): CKTOTAL, CKMB, CKMBINDEX, TROPONINI in the last 168 hours. BNP (last 3 results) No results for input(s): PROBNP in the last 8760 hours. HbA1C:  Recent Labs  06/05/16 0551  HGBA1C 5.3   CBG:  Recent Labs Lab 06/06/16 1638 06/06/16 1942 06/06/16 2346 06/07/16 0731 06/07/16 1145  GLUCAP 87 93 81 74 138*   Lipid Profile: No results for input(s): CHOL, HDL, LDLCALC, TRIG, CHOLHDL, LDLDIRECT in the last 72 hours. Thyroid Function Tests:  Recent Labs  06/05/16 0551  TSH 0.903   Anemia Panel: No results for input(s): VITAMINB12, FOLATE, FERRITIN, TIBC, IRON, RETICCTPCT in the last 72 hours. Sepsis Labs:  Recent Labs Lab 06/04/16 1646 06/04/16 2024  LATICACIDVEN 1.80 0.85    Recent Results (from the past 240 hour(s))  Culture, blood (Routine X 2) w Reflex to ID Panel     Status: Abnormal   Collection Time: 06/04/16  4:29 PM  Result Value Ref Range Status   Specimen Description BLOOD BLOOD  RIGHT FOREARM  Final   Special Requests BOTTLES DRAWN AEROBIC AND ANAEROBIC 5ML EA  Final   Culture  Setup Time   Final    GRAM POSITIVE COCCI IN CLUSTERS IN BOTH AEROBIC AND ANAEROBIC BOTTLES CRITICAL RESULT CALLED TO, READ BACK BY AND VERIFIED WITH: E JACKSON,PHARMD AT 2050 06/05/16 BY V WILKINS Performed at [facility]     Culture STAPHYLOCOCCUS AUREUS (A)  Final   Report Status 06/07/2016 FINAL  Final   Organism ID, Bacteria STAPHYLOCOCCUS AUREUS  Final      Susceptibility   Staphylococcus aureus - MIC*    CIPROFLOXACIN <=0.5 SENSITIVE Sensitive     ERYTHROMYCIN <=0.25 SENSITIVE Sensitive     GENTAMICIN <=0.5 SENSITIVE Sensitive     OXACILLIN <=0.25 SENSITIVE Sensitive     TETRACYCLINE <=1 SENSITIVE Sensitive     VANCOMYCIN <=0.5 SENSITIVE Sensitive     TRIMETH/SULFA <=10 SENSITIVE Sensitive     CLINDAMYCIN <=0.25 SENSITIVE Sensitive     RIFAMPIN <=0.5 SENSITIVE Sensitive     Inducible Clindamycin NEGATIVE Sensitive     * STAPHYLOCOCCUS AUREUS  Blood Culture ID Panel (Reflexed)     Status: Abnormal   Collection Time: 06/04/16  4:29 PM  Result Value Ref Range Status   Enterococcus species NOT DETECTED NOT DETECTED Final   Listeria monocytogenes NOT DETECTED NOT DETECTED Final   Staphylococcus species DETECTED (A) NOT DETECTED Final    Comment: CRITICAL RESULT CALLED TO, READ BACK BY AND VERIFIED WITH: E.JACKSON,PHARMD 06/05/16 @2050  BY V.WILKINS    Staphylococcus aureus DETECTED (A) NOT DETECTED Final    Comment: CRITICAL RESULT CALLED TO, READ BACK BY AND VERIFIED WITH: E.JACKSON,PHARMD 06/05/16 @2050  BY V.WILKINS    Methicillin resistance NOT DETECTED NOT DETECTED Final   Streptococcus species NOT DETECTED NOT DETECTED Final   Streptococcus agalactiae NOT DETECTED NOT DETECTED Final   Streptococcus pneumoniae NOT DETECTED NOT DETECTED Final   Streptococcus pyogenes NOT DETECTED NOT DETECTED Final   Acinetobacter baumannii NOT DETECTED NOT DETECTED Final    Enterobacteriaceae species NOT DETECTED NOT DETECTED Final   Enterobacter cloacae complex NOT DETECTED NOT DETECTED Final   Escherichia coli NOT DETECTED NOT DETECTED Final   Klebsiella oxytoca NOT DETECTED NOT DETECTED Final   Klebsiella pneumoniae NOT DETECTED NOT DETECTED Final   Proteus species NOT DETECTED NOT DETECTED Final   Serratia marcescens NOT DETECTED NOT DETECTED Final   Haemophilus influenzae NOT  DETECTED NOT DETECTED Final   Neisseria meningitidis NOT DETECTED NOT DETECTED Final   Pseudomonas aeruginosa NOT DETECTED NOT DETECTED Final   Candida albicans NOT DETECTED NOT DETECTED Final   Candida glabrata NOT DETECTED NOT DETECTED Final   Candida krusei NOT DETECTED NOT DETECTED Final   Candida parapsilosis NOT DETECTED NOT DETECTED Final   Candida tropicalis NOT DETECTED NOT DETECTED Final    Comment: Performed at [facility]  Culture, blood (Routine X 2) w Reflex to ID Panel     Status: None (Preliminary result)   Collection Time: 06/04/16  5:45 PM  Result Value Ref Range Status   Specimen Description BLOOD RIGHT ANTECUBITAL  Final   Special Requests BOTTLES DRAWN AEROBIC AND ANAEROBIC 5ML  Final   Culture PENDING  Incomplete   Report Status PENDING  Incomplete  MRSA PCR Screening     Status: None   Collection Time: 06/05/16  6:30 AM  Result Value Ref Range Status   MRSA by PCR NEGATIVE NEGATIVE Final    Comment:        The GeneXpert MRSA Assay (FDA approved for NASAL specimens only), is one component of a comprehensive MRSA colonization surveillance program. It is not intended to diagnose MRSA infection nor to guide or monitor treatment for MRSA infections.   Aerobic/Anaerobic Culture (surgical/deep wound)     Status: None (Preliminary result)   Collection Time: 06/06/16  7:29 PM  Result Value Ref Range Status   Specimen Description ABSCESS  Final   Special Requests NONE  Final   Gram Stain   Final    FEW WBC PRESENT,BOTH PMN AND MONONUCLEAR NO  ORGANISMS SEEN    Culture   Final    CULTURE REINCUBATED FOR BETTER GROWTH Performed at [facility]    Report Status PENDING  Incomplete         Radiology Studies: No results found.      Scheduled Meds: . arformoterol  15 mcg Nebulization BID  . aspirin  81 mg Oral Daily  . budesonide (PULMICORT) nebulizer solution  0.25 mg Nebulization BID  .  ceFAZolin (ANCEF) IV  2 g Intravenous Q8H  . clonazePAM  1 mg Oral QHS  . dolutegravir  50 mg Oral Daily  . emtricitabine-tenofovir AF  1 tablet Oral Daily  . enoxaparin (LOVENOX) injection  40 mg Subcutaneous Q24H  . feeding supplement (ENSURE ENLIVE)  237 mL Oral BID BM  . guaiFENesin  600 mg Oral BID  . insulin aspart  0-5 Units Subcutaneous QHS  . insulin aspart  0-9 Units Subcutaneous TID WC  . metoprolol tartrate  50 mg Oral BID  . morphine  30 mg Oral Q12H  . multivitamin with minerals  1 tablet Oral Daily  . nicotine  21 mg Transdermal Daily  . sodium chloride flush  3 mL Intravenous Q12H  . spironolactone  25 mg Oral Daily   Continuous Infusions: . sodium chloride 10 mL/hr at 06/07/16 0247     LOS: 3 days    Time spent: 35 minutes    Loistine Eberlin, MD [facility] Pager [phone]  If 7PM-7AM, please contact night-coverage [email or web address] Password [email or web address] 06/07/2016, 11:48 AM

## 2016-06-07 NOTE — Progress Notes (Signed)
CSW received consult for SNF placement, though reviewed PT evaluation recommending Home Health.   No further CSW needs identified - CSW signing off.   Lincoln MaxinKelly Prosperity Darrough, LCSW [facility] Clinical Social Worker cell #: [phone]

## 2016-06-07 NOTE — Progress Notes (Signed)
Patient ID: Johnathan LarsenGary W [name], male   DOB: 12/13/1960, 55 y.o.   MRN: [id number] Postoperative day 1 right foot fifth ray amputation. Patient states he has been up to the bathroom several times ambulating independently. Anticipate patient could be discharged to home without antibiotics. I will follow-up in the office next week.

## 2016-06-07 NOTE — Consult Note (Signed)
   [facility]   06/07/2016  Doristine ChurchGary W [facility] 1961/02/07 [id number]    Patient [facility] services. Went to bedside to offer and explain [facility] program with Mr. Evangeline GulaLoye. He [facility] Care Management follow up at this time. He was very tearful during bedside encounter. States "I do fine when I am at home when I have my medicine. States I need my antidepressant now not at night". Expresses appreciation of visit but pleasantly declines [facility] at this time stating he manages his health well at home. Accepted [facility] Care [facility] brochure with contact information to call in future if changes mind. Will make inpatient RNCM aware that patient declined [facility] Care Management program services. Also made Mr. Madaline SavageLoye's nurse aware how upset he was about needing his antidepressant. His nurse went to bedside soon after writer's visit.    Raiford NobleAtika Hall, MSN-Ed, RN,BSN [facility] Care Management Hospital Liaison [phone]

## 2016-06-07 NOTE — Evaluation (Signed)
Physical Therapy Evaluation Patient Details Name: [name] MRN: [id number] DOB: Dec 31, 1960 Today's Date: 06/07/2016   History of Present Illness  [name] is a 55 y.o. male adm with worsening nonhealing right foot ulcer  recently had MRI done that showed osteomyelitis, s/p R 5th Ray amp;  medical history significant of AIDS, DM2 ,COPD,   Clinical Impression  Pt admitted with above diagnosis. Pt currently with functional limitations due to the deficits listed below (see PT Problem List).  Pt will benefit from skilled PT to increase their independence and safety with mobility to allow discharge to the venue listed below.   Pt would benefit from SNF, not really agreeable at this time, wants to go home; pt is unsafe with mobility (LOB x2 with heavy min assist to recover, educated on NWB, use of [employer] wedge as an end result if pt unable to maintain NWB d/t poor balance and rapid fatigue; recommend knee scooter for home as well as HHPT to incr pt safety and IND     Follow Up Recommendations Home health PT;Supervision for mobility/OOB    Equipment Recommendations  Other (comment) (knee scooter)    Recommendations for Other Services       Precautions / Restrictions Precautions Precautions: Fall Restrictions Weight Bearing Restrictions: Yes RLE Weight Bearing: Non weight bearing Other Position/Activity Restrictions: pt has been up amb to bathroom placing full wt on RLE; educated pt on NWB, reason for and importance of this      Mobility  Bed Mobility Overal bed mobility: Needs Assistance Bed Mobility: Supine to Sit     Supine to sit: Supervision     General bed mobility comments: for lines and safety  Transfers Overall transfer level: Needs assistance Equipment used: Rolling walker (2 wheeled) Transfers: Sit to/from Stand Sit to Stand: Min assist         General transfer comment: assist for balance during tranisition to/from RW  Ambulation/Gait Ambulation/Gait  assistance: Min assist Ambulation Distance (Feet): 12 Feet (x2) Assistive device: Rolling walker (2 wheeled)       General Gait Details: cues for sequence and NWB, pt is non-compliant at times with NWB, LOB x2 with heavy min to recover  Stairs            Wheelchair Mobility    Modified Rankin (Stroke Patients Only)       Balance Overall balance assessment: Needs assistance   Sitting balance-Leahy Scale: Good       Standing balance-Leahy Scale: Poor Standing balance comment: reliant on UEs for balance                             Pertinent Vitals/Pain Pain Assessment: No/denies pain    Home Living Family/patient expects to be discharged to:: Private residence Living Arrangements: Alone   Type of Home: Apartment Home Access: Stairs to enter   [employer] Corporation[employer]trance Stairs-Number of Steps: 1 Home Layout: One level Home Equipment: Walker - 2 wheels      Prior Function Level of Independence: Independent               Hand Dominance        Extremity/Trunk Assessment   Upper Extremity Assessment: Overall WFL for tasks assessed           Lower Extremity Assessment: RLE deficits/detail;LLE deficits/detail RLE Deficits / Details: grossly WFL ankle, knee and hip; strength 4/5 although fatigues quickly       Communication  Communication: No difficulties  Cognition Arousal/Alertness: Awake/alert Behavior During Therapy: WFL for tasks assessed/performed Overall Cognitive Status: Within Functional Limits for tasks assessed                      General Comments      Exercises     Assessment/Plan    PT Assessment Patient needs continued PT services  PT Problem List Decreased strength;Decreased activity tolerance;Decreased balance;Decreased coordination;Decreased mobility;Decreased safety awareness;Decreased knowledge of precautions;Decreased knowledge of use of DME          PT Treatment Interventions DME instruction;Gait  training;Functional mobility training;Therapeutic activities;Therapeutic exercise;Patient/family education;Stair training    PT Goals (Current goals can be found in the Care Plan section)  Acute Rehab PT Goals Patient Stated Goal: home soon PT Goal Formulation: With patient Time For Goal Achievement: 06/14/16 Potential to Achieve Goals: Good    Frequency Min 3X/week   Barriers to discharge        Co-evaluation               End of Session Equipment Utilized During Treatment: Gait belt Activity Tolerance: Patient tolerated treatment well;Patient limited by fatigue Patient left: with call bell/phone within reach;in chair;with chair alarm set Nurse Communication: Mobility status         Time: [id number] PT Time Calculation (min) (ACUTE ONLY): 23 min   Charges:   PT Evaluation $PT Eval Moderate Complexity: 1 Procedure PT Treatments $Gait Training: 8-22 mins   PT G Codes:        Nelli Swalley 06/07/2016, 11:16 AM

## 2016-06-07 NOTE — Progress Notes (Signed)
[facility]   Reason for visit: Follow up on Staph aureus bacteremia  Interval History:  bacteremia with Staph aurues, mssa, 1/2 blood cultures.  Afebrile, amputation yesterday.  No chills, no diarrhea, no rash.     Physical Exam: Constitutional:  Vitals:   06/07/16 0807 06/07/16 1134  BP: 103/67 110/64  Pulse: 76 69  Resp: 20 20  Temp: 98 F (36.7 C) 98.1 F (36.7 C)   patient appears in NAD Eyes: anicteric Respiratory: Normal respiratory effort; CTA B Cardiovascular: RRR   Review of Systems: Constitutional: negative for fevers, chills, sweats and malaise Gastrointestinal: negative for nausea and diarrhea  Lab Results  Component Value Date   WBC 4.9 06/07/2016   HGB 10.9 (L) 06/07/2016   HCT 33.5 (L) 06/07/2016   MCV 87.2 06/07/2016   PLT 199 06/07/2016    Lab Results  Component Value Date   CREATININE 0.74 06/07/2016   BUN 12 06/07/2016   NA 136 06/07/2016   K 4.2 06/07/2016   CL 105 06/07/2016   CO2 23 06/07/2016    Lab Results  Component Value Date   ALT 10 (L) 06/05/2016   AST 11 (L) 06/05/2016   ALKPHOS 74 06/05/2016     Microbiology: Recent Results (from the past 240 hour(s))  Culture, blood (Routine X 2) w Reflex to ID Panel     Status: Abnormal   Collection Time: 06/04/16  4:29 PM  Result Value Ref Range Status   Specimen Description BLOOD BLOOD RIGHT FOREARM  Final   Special Requests BOTTLES DRAWN AEROBIC AND ANAEROBIC 5ML EA  Final   Culture  Setup Time   Final    GRAM POSITIVE COCCI IN CLUSTERS IN BOTH AEROBIC AND ANAEROBIC BOTTLES CRITICAL RESULT CALLED TO, READ BACK BY AND VERIFIED WITH: E JACKSON,PHARMD AT 2050 06/05/16 BY V WILKINS Performed at [facility]    Culture STAPHYLOCOCCUS AUREUS (A)  Final   Report Status 06/07/2016 FINAL  Final   Organism ID, Bacteria STAPHYLOCOCCUS AUREUS  Final      Susceptibility   Staphylococcus aureus - MIC*    CIPROFLOXACIN <=0.5 SENSITIVE Sensitive    ERYTHROMYCIN <=0.25 SENSITIVE Sensitive     GENTAMICIN <=0.5 SENSITIVE Sensitive     OXACILLIN <=0.25 SENSITIVE Sensitive     TETRACYCLINE <=1 SENSITIVE Sensitive     VANCOMYCIN <=0.5 SENSITIVE Sensitive     TRIMETH/SULFA <=10 SENSITIVE Sensitive     CLINDAMYCIN <=0.25 SENSITIVE Sensitive     RIFAMPIN <=0.5 SENSITIVE Sensitive     Inducible Clindamycin NEGATIVE Sensitive     * STAPHYLOCOCCUS AUREUS  Blood Culture ID Panel (Reflexed)     Status: Abnormal   Collection Time: 06/04/16  4:29 PM  Result Value Ref Range Status   Enterococcus species NOT DETECTED NOT DETECTED Final   Listeria monocytogenes NOT DETECTED NOT DETECTED Final   Staphylococcus species DETECTED (A) NOT DETECTED Final    Comment: CRITICAL RESULT CALLED TO, READ BACK BY AND VERIFIED WITH: E.JACKSON,PHARMD 06/05/16 @2050  BY V.WILKINS    Staphylococcus aureus DETECTED (A) NOT DETECTED Final    Comment: CRITICAL RESULT CALLED TO, READ BACK BY AND VERIFIED WITH: E.JACKSON,PHARMD 06/05/16 @2050  BY V.WILKINS    Methicillin resistance NOT DETECTED NOT DETECTED Final   Streptococcus species NOT DETECTED NOT DETECTED Final   Streptococcus agalactiae NOT DETECTED NOT DETECTED Final   Streptococcus pneumoniae NOT DETECTED NOT DETECTED Final   Streptococcus pyogenes NOT DETECTED NOT DETECTED Final   Acinetobacter baumannii NOT DETECTED  NOT DETECTED Final   Enterobacteriaceae species NOT DETECTED NOT DETECTED Final   Enterobacter cloacae complex NOT DETECTED NOT DETECTED Final   Escherichia coli NOT DETECTED NOT DETECTED Final   Klebsiella oxytoca NOT DETECTED NOT DETECTED Final   Klebsiella pneumoniae NOT DETECTED NOT DETECTED Final   Proteus species NOT DETECTED NOT DETECTED Final   Serratia marcescens NOT DETECTED NOT DETECTED Final   Haemophilus influenzae NOT DETECTED NOT DETECTED Final   Neisseria meningitidis NOT DETECTED NOT DETECTED Final   Pseudomonas aeruginosa NOT DETECTED NOT DETECTED Final   Candida albicans  NOT DETECTED NOT DETECTED Final   Candida glabrata NOT DETECTED NOT DETECTED Final   Candida krusei NOT DETECTED NOT DETECTED Final   Candida parapsilosis NOT DETECTED NOT DETECTED Final   Candida tropicalis NOT DETECTED NOT DETECTED Final    Comment: Performed at [facility]  Culture, blood (Routine X 2) w Reflex to ID Panel     Status: None (Preliminary result)   Collection Time: 06/04/16  5:45 PM  Result Value Ref Range Status   Specimen Description BLOOD RIGHT ANTECUBITAL  Final   Special Requests BOTTLES DRAWN AEROBIC AND ANAEROBIC 5ML  Final   Culture PENDING  Incomplete   Report Status PENDING  Incomplete  MRSA PCR Screening     Status: None   Collection Time: 06/05/16  6:30 AM  Result Value Ref Range Status   MRSA by PCR NEGATIVE NEGATIVE Final    Comment:        The GeneXpert MRSA Assay (FDA approved for NASAL specimens only), is one component of a comprehensive MRSA colonization surveillance program. It is not intended to diagnose MRSA infection nor to guide or monitor treatment for MRSA infections.   Aerobic/Anaerobic Culture (surgical/deep wound)     Status: None (Preliminary result)   Collection Time: 06/06/16  7:29 PM  Result Value Ref Range Status   Specimen Description ABSCESS  Final   Special Requests NONE  Final   Gram Stain   Final    FEW WBC PRESENT,BOTH PMN AND MONONUCLEAR NO ORGANISMS SEEN    Culture   Final    CULTURE REINCUBATED FOR BETTER GROWTH Performed at [facility] [place]    Report Status PENDING  Incomplete    Impression/Plan:  1. Osteomyelitis - amputation done.  Will get IV antibiotics post discharge related to Staph aureus.  2 weeks if TEE negative.  2.  MSSA bacteremia - TTE done.  Repeat blood cultures sent.  TEE will determine duration as 1/2 blood cultures, toe as source, does not exclude endocarditis.  If no TEE done, he should get 6 weeks of IV cefazolin.  3.  HIV - on Tivicay/Descovy and doing well.  Viral load  suppressed.

## 2016-06-07 NOTE — Progress Notes (Signed)
Pt will need to go to prescription to Medical supply store to rent a Knee Scooter.  [facility] have Knee Scooter at their medical store.

## 2016-06-07 NOTE — Addendum Note (Signed)
Addendum  created 06/07/16 [id number] by Elyn PeersSandra J Calliope Delangel, CRNA   Charge Capture section accepted

## 2016-06-07 NOTE — Progress Notes (Signed)
Patient ID: Johnathan LarsenGary W [name], male   DOB: Feb 22, 1961, 55 y.o.   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 Interaction with amiodarone  Inpatient Medications    Scheduled Meds: . arformoterol  15 mcg Nebulization BID  . aspirin  81 mg Oral Daily  . budesonide (PULMICORT) nebulizer solution  0.25 mg Nebulization BID  .  ceFAZolin (ANCEF) IV  2 g Intravenous Q8H  . clonazePAM  1 mg Oral QHS  . dolutegravir  50 mg Oral Daily  . emtricitabine-tenofovir AF  1 tablet Oral Daily  . enoxaparin (LOVENOX) injection  40 mg Subcutaneous Q24H  . feeding supplement (ENSURE ENLIVE)  237 mL Oral BID BM  . guaiFENesin  600 mg Oral BID  . insulin aspart  0-5 Units Subcutaneous QHS  . insulin aspart  0-9 Units Subcutaneous TID WC  . metoprolol tartrate  50 mg Oral BID  . morphine  30 mg Oral Q12H  . multivitamin with minerals  1 tablet Oral Daily  . nicotine  21 mg Transdermal Daily  . sodium chloride flush  3 mL Intravenous Q12H  . spironolactone  25 mg Oral Daily   Continuous Infusions: . sodium chloride 10 mL/hr at 06/07/16 0247  . amiodarone 30 mg/hr (06/07/16 0917)   PRN Meds: acetaminophen **OR** acetaminophen, albuterol, ALPRAZolam, benzonatate, HYDROcodone-acetaminophen, HYDROmorphone (DILAUDID) injection, methocarbamol **OR** methocarbamol (ROBAXIN)  IV, metoCLOPramide  **OR** metoCLOPramide (REGLAN) injection, ondansetron **OR** ondansetron (ZOFRAN) IV, oxyCODONE   Vital Signs    Vitals:   06/07/16 0207 06/07/16 0504 06/07/16 0807 06/07/16 0837  BP: 108/65 110/81 103/67   Pulse: 64 72 76   Resp: 20 20 20    Temp: 98 F (36.7 C) 97.7 F (36.5 C) 98 F (36.7 C)   TempSrc: Oral Oral Oral   SpO2: 98% 97% 97% 95%  Weight:      Height:        Intake/Output Summary (Last 24 hours) at 06/07/16 1027 Last data filed at 06/07/16 0600  Gross per 24 hour  Intake          1819.92 ml  Output               50 ml  Net          1769.92 ml   Filed Weights   06/04/16 1825 06/04/16 2100  Weight: 93.1 kg (205 lb 3 oz) 93 kg (205 lb)    Physical Exam    GEN: Well nourished, well developed, in no acute distress.  HEENT: Grossly normal.  Neck: Supple, no JVD, carotid bruits, or masses. Cardiac: RRR, loud MR  murmurs, rubs, or gallops. No clubbing, cyanosis, edema.  Radials/DP/PT 2+ and equal bilaterally.  Respiratory:  Respirations regular and unlabored, clear to auscultation bilaterally. GI: Soft, nontender, nondistended, BS + x 4. MS: no deformity or atrophy. Skin:  warm and dry, no rash. Neuro:  Strength and sensation are intact. Psych: AAOx3.  Normal affect. 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 Basic Metabolic Panel  Recent Labs  06/05/16 0551 06/06/16 0520 06/07/16 0417  NA 133* 135 136  K 3.5 3.5 4.2  CL 101 100* 105  CO2 25 27 23   GLUCOSE 87 108* 84  BUN 15 16 12   CREATININE 0.90 0.81 0.74  CALCIUM 8.7* 8.7* 9.1  MG 1.7 2.2 2.0  PHOS 3.3  --   --    Liver Function Tests  Recent Labs   06/04/16 1640 06/05/16 0551  AST 17 11*  ALT 12* 10*  ALKPHOS 94 74  BILITOT 1.3* 1.0  PROT 8.4* 6.9  ALBUMIN 3.7 3.0*   Hemoglobin A1C  Recent Labs  06/05/16 0551  HGBA1C 5.3   Thyroid Function Tests  Recent Labs  06/05/16 0551  TSH 0.903    Telemetry    NSR 06/07/2016  - Personally Reviewed  ECG    NSR rate 91 PR 201 no acute changes   - Personally Reviewed  Radiology    No results found.  Cardiac Studies   Echo EF 50-55%   Patient Profile       Assessment & Plan    VT: Telemetry with short asymptomatic burst NSVT less than 10 beats yesterday preop  He stopped beta blocker a year ago due to fatigue. D/c amiodarone drip now that surgery done D/c with lopressor 25 bid will arrange outpatient f/u with Dr Ladona Ridgelaylor  MR:  Murmur is very loud but echo with mild to moderate MR   Osteomyelitis:  post amputation plan per ortho    Will sign off and arrange f/u with DR Lavell Anchorsaylor   Signed, Peter Nishan, MD  06/07/2016, 10:27 AM

## 2016-06-08 ENCOUNTER — Encounter (HOSPITAL_BASED_OUTPATIENT_CLINIC_OR_DEPARTMENT_OTHER): Payer: Medicare Other | Attending: Surgery

## 2016-06-08 ENCOUNTER — Encounter (HOSPITAL_BASED_OUTPATIENT_CLINIC_OR_DEPARTMENT_OTHER): Payer: Self-pay

## 2016-06-08 DIAGNOSIS — B181 Chronic viral hepatitis B without delta-agent: Secondary | ICD-10-CM

## 2016-06-08 DIAGNOSIS — B20 Human immunodeficiency virus [HIV] disease: Secondary | ICD-10-CM

## 2016-06-08 DIAGNOSIS — K7460 Unspecified cirrhosis of liver: Secondary | ICD-10-CM

## 2016-06-08 LAB — GLUCOSE, CAPILLARY
GLUCOSE-CAPILLARY: 79 mg/dL (ref 65–99)
GLUCOSE-CAPILLARY: 119 mg/dL — AB (ref 65–99)
GLUCOSE-CAPILLARY: 89 mg/dL (ref 65–99)

## 2016-06-08 LAB — BASIC METABOLIC PANEL
Anion gap: 8 (ref 5–15)
BUN: 12 mg/dL (ref 6–20)
CHLORIDE: 102 mmol/L (ref 101–111)
CO2: 25 mmol/L (ref 22–32)
CREATININE: 0.77 mg/dL (ref 0.61–1.24)
Calcium: 9.3 mg/dL (ref 8.9–10.3)
GFR calc Af Amer: >60 mL/min (ref >60)
GFR calc non Af Amer: >60 mL/min (ref >60)
Glucose, Bld: 94 mg/dL (ref 65–99)
POTASSIUM: 3.9 mmol/L (ref 3.5–5.1)
Sodium: 135 mmol/L (ref 135–145)

## 2016-06-08 LAB — CBC
HCT: 33 % — ABNORMAL LOW (ref 39.0–52.0)
Hemoglobin: 10.6 g/dL — ABNORMAL LOW (ref 13.0–17.0)
MCH: 28 pg (ref 26.0–34.0)
MCHC: 32.1 g/dL (ref 30.0–36.0)
MCV: 87.1 fL (ref 78.0–100.0)
PLATELETS: 225 10*3/uL (ref 150–400)
RBC: 3.79 MIL/uL — ABNORMAL LOW (ref 4.22–5.81)
RDW: 15.1 % (ref 11.5–15.5)
WBC: 6.3 10*3/uL (ref 4.0–10.5)

## 2016-06-08 NOTE — Progress Notes (Signed)
Patient ID: Johnathan LarsenGary W Giammarco, male   DOB: 01-11-1961, 55 y.o.   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 arformoterol  15 mcg Nebulization BID  . aspirin  81 mg Oral Daily  . budesonide (PULMICORT) nebulizer solution  0.25 mg Nebulization BID  .  ceFAZolin (ANCEF) IV  2 g Intravenous Q8H  . clonazePAM  1 mg Oral QHS  . dolutegravir  50 mg Oral Daily  . emtricitabine-tenofovir AF  1 tablet Oral Daily  . enoxaparin (LOVENOX) injection  40 mg Subcutaneous Q24H  . feeding supplement (ENSURE ENLIVE)  237 mL Oral BID BM  . guaiFENesin  600 mg Oral BID  . insulin aspart  0-5 Units Subcutaneous QHS  . insulin aspart  0-9 Units Subcutaneous TID WC  . metoprolol tartrate  50 mg Oral BID  . morphine  30 mg Oral Q12H  . multivitamin with minerals  1 tablet Oral Daily  . nicotine  21 mg Transdermal Daily  . nortriptyline  50 mg Oral QHS  . sodium chloride flush  3 mL Intravenous Q12H  . spironolactone  25 mg Oral Daily   Continuous Infusions: . sodium chloride 10 mL/hr at 06/07/16 0247   PRN Meds: acetaminophen **OR** acetaminophen, albuterol, ALPRAZolam, benzonatate, HYDROcodone-acetaminophen, HYDROmorphone (DILAUDID) injection, methocarbamol **OR** methocarbamol (ROBAXIN)  IV, metoCLOPramide **OR** metoCLOPramide (REGLAN) injection, ondansetron **OR**  ondansetron (ZOFRAN) IV, oxyCODONE   Vital Signs    Vitals:   06/07/16 1658 06/07/16 2116 06/07/16 2256 06/08/16 0550  BP: 95/70  137/75 97/60  Pulse: 68 72 90 67  Resp: 20 18 16 18   Temp: 98 F (36.7 C)  98.1 F (36.7 C) 98 F (36.7 C)  TempSrc: Oral  Oral Oral  SpO2: 98% 95% 97% 92%  Weight:      Height:       No intake or output data in the 24 hours ending 06/08/16 0950 Filed Weights   06/04/16 1825 06/04/16 2100  Weight: 205 lb 3 oz (93.1 kg) 205 lb (93 kg)    Physical Exam    GEN: Well nourished, well developed, in no acute distress.  HEENT: Grossly normal.  Neck: Supple, no JVD, carotid bruits, or masses. Cardiac: RRR, loud MR  murmurs, rubs, or gallops. No clubbing, cyanosis, edema.  Radials/DP/PT 2+ and equal bilaterally.  Respiratory:  Respirations regular and unlabored, clear to auscultation bilaterally. GI: Soft, nontender, nondistended, BS + x 4. MS: no deformity or atrophy. Skin: warm and dry, no rash. Neuro:  Strength and sensation are intact. Psych: AAOx3.  Normal affect. 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 Hemoglobin A1C No results for input(s): HGBA1C in the last 72 hours. Thyroid Function Tests No results for input(s): TSH, T4TOTAL, T3FREE, THYROIDAB in the last 72 hours.  Invalid input(s): FREET3  Telemetry    NSR 06/08/2016  - Personally Reviewed  ECG    NSR  rate 91 PR 201 no acute changes   - Personally Reviewed  Radiology    No results found.  Cardiac Studies   Echo EF 50-55%   Patient Profile       Assessment & Plan    VT: Telemetry with short asymptomatic burst NSVT less than 10 beats yesterday preop  He stopped beta blocker a year ago due to fatigue. D/c amiodarone drip now that surgery done D/c with lopressor 25 bid will arrange outpatient f/u with Dr Ladona Ridgelaylor  MR:  Murmur is very loud but echo with mild to moderate MR   Osteomyelitis:  post amputation plan per ortho BC;s 1/2 positive for staph Per Dr Luciana Axeomer if no TEE Done patient will need 6 weeks antibiotics. [facility]     Signed, Charlton HawsPeter Zulma Court, MD  06/08/2016, 9:50 AM  Patient ID: Johnathan LarsenGary W Koehn, male   DOB: 08-18-1960, 55 y.o.   MRN: [id number]

## 2016-06-08 NOTE — Care Management Important Message (Signed)
Important Message  Patient Details  Name: [name] MRN: [id number] Date of Birth: 1961-01-07   Medicare Important Message Given:  Yes    Haskell FlirtJamison, Jenner Rosier 06/08/2016, 11:59 AMImportant Message  Patient Details  Name: [name] MRN: [id number] Date of Birth: 1961-01-07   Medicare Important Message Given:  Yes    Haskell FlirtJamison, Neomia Herbel 06/08/2016, 11:59 AM

## 2016-06-08 NOTE — Progress Notes (Signed)
[facility]   Reason for visit: Follow up on Staph aureus bacteremia  Interval History:  S/p amputation and good viable tissue left, good gross margins according to OP report. No fever, no chills; repeat blood cultures sent   Physical Exam: Constitutional:  Vitals:   06/08/16 0550 06/08/16 1300  BP: 97/60 108/62  Pulse: 67 78  Resp: 18 18  Temp: 98 F (36.7 C) 98.4 F (36.9 C)   patient appears in NAD Eyes: anicteric Respiratory: Normal respiratory effort; CTA B Cardiovascular: RRR MS: right leg wrapped   Review of Systems: Constitutional: negative for fevers, chills, sweats and malaise Gastrointestinal: negative for nausea and diarrhea  Lab Results  Component Value Date   WBC 6.3 06/08/2016   HGB 10.6 (L) 06/08/2016   HCT 33.0 (L) 06/08/2016   MCV 87.1 06/08/2016   PLT 225 06/08/2016    Lab Results  Component Value Date   CREATININE 0.77 06/08/2016   BUN 12 06/08/2016   NA 135 06/08/2016   K 3.9 06/08/2016   CL 102 06/08/2016   CO2 25 06/08/2016    Lab Results  Component Value Date   ALT 10 (L) 06/05/2016   AST 11 (L) 06/05/2016   ALKPHOS 74 06/05/2016     Microbiology: Recent Results (from the past 240 hour(s))  Culture, blood (Routine X 2) w Reflex to ID Panel     Status: Abnormal   Collection Time: 06/04/16  4:29 PM  Result Value Ref Range Status   Specimen Description BLOOD BLOOD RIGHT FOREARM  Final   Special Requests BOTTLES DRAWN AEROBIC AND ANAEROBIC 5ML EA  Final   Culture  Setup Time   Final    GRAM POSITIVE COCCI IN CLUSTERS IN BOTH AEROBIC AND ANAEROBIC BOTTLES CRITICAL RESULT CALLED TO, READ BACK BY AND VERIFIED WITH: E JACKSON,PHARMD AT 2050 06/05/16 BY V WILKINS Performed at [facility]    Culture STAPHYLOCOCCUS AUREUS (A)  Final   Report Status 06/07/2016 FINAL  Final   Organism ID, Bacteria STAPHYLOCOCCUS AUREUS  Final      Susceptibility   Staphylococcus aureus - MIC*    CIPROFLOXACIN <=0.5  SENSITIVE Sensitive     ERYTHROMYCIN <=0.25 SENSITIVE Sensitive     GENTAMICIN <=0.5 SENSITIVE Sensitive     OXACILLIN <=0.25 SENSITIVE Sensitive     TETRACYCLINE <=1 SENSITIVE Sensitive     VANCOMYCIN <=0.5 SENSITIVE Sensitive     TRIMETH/SULFA <=10 SENSITIVE Sensitive     CLINDAMYCIN <=0.25 SENSITIVE Sensitive     RIFAMPIN <=0.5 SENSITIVE Sensitive     Inducible Clindamycin NEGATIVE Sensitive     * STAPHYLOCOCCUS AUREUS  Blood Culture ID Panel (Reflexed)     Status: Abnormal   Collection Time: 06/04/16  4:29 PM  Result Value Ref Range Status   Enterococcus species NOT DETECTED NOT DETECTED Final   Listeria monocytogenes NOT DETECTED NOT DETECTED Final   Staphylococcus species DETECTED (A) NOT DETECTED Final    Comment: CRITICAL RESULT CALLED TO, READ BACK BY AND VERIFIED WITH: E.JACKSON,PHARMD 06/05/16 @2050  BY V.WILKINS    Staphylococcus aureus DETECTED (A) NOT DETECTED Final    Comment: CRITICAL RESULT CALLED TO, READ BACK BY AND VERIFIED WITH: E.JACKSON,PHARMD 06/05/16 @2050  BY V.WILKINS    Methicillin resistance NOT DETECTED NOT DETECTED Final   Streptococcus species NOT DETECTED NOT DETECTED Final   Streptococcus agalactiae NOT DETECTED NOT DETECTED Final   Streptococcus pneumoniae NOT DETECTED NOT DETECTED Final   Streptococcus pyogenes NOT DETECTED NOT DETECTED  Final   Acinetobacter baumannii NOT DETECTED NOT DETECTED Final   Enterobacteriaceae species NOT DETECTED NOT DETECTED Final   Enterobacter cloacae complex NOT DETECTED NOT DETECTED Final   Escherichia coli NOT DETECTED NOT DETECTED Final   Klebsiella oxytoca NOT DETECTED NOT DETECTED Final   Klebsiella pneumoniae NOT DETECTED NOT DETECTED Final   Proteus species NOT DETECTED NOT DETECTED Final   Serratia marcescens NOT DETECTED NOT DETECTED Final   Haemophilus influenzae NOT DETECTED NOT DETECTED Final   Neisseria meningitidis NOT DETECTED NOT DETECTED Final   Pseudomonas aeruginosa NOT DETECTED NOT DETECTED  Final   Candida albicans NOT DETECTED NOT DETECTED Final   Candida glabrata NOT DETECTED NOT DETECTED Final   Candida krusei NOT DETECTED NOT DETECTED Final   Candida parapsilosis NOT DETECTED NOT DETECTED Final   Candida tropicalis NOT DETECTED NOT DETECTED Final    Comment: Performed at [facility]  Culture, blood (Routine X 2) w Reflex to ID Panel     Status: None (Preliminary result)   Collection Time: 06/04/16  5:45 PM  Result Value Ref Range Status   Specimen Description BLOOD RIGHT ANTECUBITAL  Final   Special Requests BOTTLES DRAWN AEROBIC AND ANAEROBIC 5ML  Final   Culture   Final    NO GROWTH 2 DAYS Performed at [facility]    Report Status PENDING  Incomplete  MRSA PCR Screening     Status: None   Collection Time: 06/05/16  6:30 AM  Result Value Ref Range Status   MRSA by PCR NEGATIVE NEGATIVE Final    Comment:        The GeneXpert MRSA Assay (FDA approved for NASAL specimens only), is one component of a comprehensive MRSA colonization surveillance program. It is not intended to diagnose MRSA infection nor to guide or monitor treatment for MRSA infections.   Aerobic/Anaerobic Culture (surgical/deep wound)     Status: None (Preliminary result)   Collection Time: 06/06/16  7:29 PM  Result Value Ref Range Status   Specimen Description ABSCESS  Final   Special Requests NONE  Final   Gram Stain   Final    FEW WBC PRESENT,BOTH PMN AND MONONUCLEAR NO ORGANISMS SEEN    Culture   Final    RARE STAPHYLOCOCCUS AUREUS SUSCEPTIBILITIES TO FOLLOW CRITICAL RESULT CALLED TO, READ BACK BY AND VERIFIED WITH: RN [email or web address] [id number] MLM Performed at [facility]    Report Status PENDING  Incomplete    Impression/Plan:  1. Osteomyelitis - amputation done.  Will get IV antibiotics post discharge related to Staph aureus, but does not need 6 weeks for this since it was amputated.  2 weeks if TEE negative.  2.  MSSA bacteremia - TTE done.  Repeat  blood cultures ngtd.  TEE tomorrow.  Picc line once blood cultures clear >  48 hours.  3.  HIV - on Tivicay/Descovy and doing well.  Viral load suppressed.

## 2016-06-08 NOTE — Progress Notes (Signed)
Pt selected [facility] for HHPT. Referral given to in house rep. Pt states that he will not need a Scooter because he had a RW.

## 2016-06-08 NOTE — Progress Notes (Signed)
Physical Therapy Treatment Patient Details Name: [name] MRN: [id number] DOB: September 01, 1960 Today's Date: 06/08/2016    History of Present Illness [name] is a 55 y.o. male adm with worsening nonhealing right foot ulcer  recently had MRI done that showed osteomyelitis, s/p R 5th Ray amp;  medical history significant of AIDS, DM2 ,COPD,     PT Comments    Pt noncompliant with NWB however was wearing Darco shoe today.  Pt would benefit from knee scooter to assist with maintaining NWB precautions (however per CM note, pt declines due to already having RW).  Also educated pt on importance of R LE elevation at rest and repositioned with pillows.     Follow Up Recommendations  Home health PT;Supervision for mobility/OOB     Equipment Recommendations  Other (comment) (knee scooter)    Recommendations for Other Services       Precautions / Restrictions Precautions Precaution Comments: has Darco shoe on in room today Restrictions Weight Bearing Restrictions: Yes RLE Weight Bearing: Non weight bearing Other Position/Activity Restrictions: reeducated pt on NWB, reason for and importance of this    Mobility  Bed Mobility               General bed mobility comments: pt up in recliner on arrival  Transfers Overall transfer level: Needs assistance Equipment used: Rolling walker (2 wheeled) Transfers: Sit to/from Stand Sit to Stand: Min guard         General transfer comment: pt attempting to rise without RW in front of him, cues to have RW in place first, verbal cues for safe technique  Ambulation/Gait Ambulation/Gait assistance: Min assist Ambulation Distance (Feet): 160 Feet Assistive device: Rolling walker (2 wheeled)       General Gait Details: cues for sequence and NWB, pt is non-compliant with NWB however was wearing Darco shoe today, LOB with attempt to open door to room requiring assist to recover   Stairs            Wheelchair Mobility     Modified Rankin (Stroke Patients Only)       Balance                                    Cognition Arousal/Alertness: Awake/alert Behavior During Therapy: WFL for tasks assessed/performed Overall Cognitive Status: Within Functional Limits for tasks assessed                      Exercises      General Comments        Pertinent Vitals/Pain Pain Assessment: No/denies pain    Home Living                      Prior Function            PT Goals (current goals can now be found in the care plan section) Progress towards PT goals: Progressing toward goals    Frequency    Min 3X/week      PT Plan Current plan remains appropriate    Co-evaluation             End of Session Equipment Utilized During Treatment: Gait belt Activity Tolerance: Patient tolerated treatment well Patient left: with call bell/phone within reach;with chair alarm set;in chair     Time: [id number] PT Time Calculation (min) (ACUTE ONLY): 11 min  Charges:  $Gait Training:  8-22 mins                    G Codes:      Oriyah Lamphear,KATHrine E 06/08/2016, 1:43 PM Zenovia JarredKati Brandonlee Navis, PT, DPT 06/08/2016 Pager: [phone]

## 2016-06-08 NOTE — Addendum Note (Signed)
Addendum  created 06/08/16 1136 by Cecile HearingStephen Edward Turk, MD   Anesthesia Intra Blocks edited, Sign clinical note

## 2016-06-08 NOTE — Progress Notes (Signed)
[facility] is set up for pick up at 8am 11/7. Melton Alarana A Analaura Messler

## 2016-06-08 NOTE — Progress Notes (Signed)
PROGRESS NOTE    Johnathan LarsenGary W [name]  [name]:[id number] DOB: 06/12/1961 DOA: 06/04/2016 PCP: Johny SaxJeffrey Hatcher, MD   Brief Narrative:  55 y.o. 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 - Patient seen by Dr. Lajoyce Cornersuda and pt underwent R fifth ray amputation on 12/4 - ID consulted - Patient continued on IV ancef from vanc, rocephin, flagyl - Blood cx pos for MSSA bacteremia - Patient is planned for TEE 06/09/16 by Cardiology - If TEE neg, then ID recommends 2 weeks of abx. If TEE pos, then ID recommends 6 weeks of IV abx - TTE neg for obvious endocarditis  #2 V. tach - Noted to have episodes of nonsustained vtach. - Cardiology consulted with recommendations for d/c on lopressor 25mg  bid and f/u with Dr. Ladona Ridgelaylor - Stable this AM  #3 MSSA bacteremia - 2d echo neg for vegetations - Repeat blood cx pending - Patient continues on ancef  #4 Hyperlipidemia versus diabetes. - Last hemoglobin A1c was 5.5 on 03/25/2009.  - glucose trends reviewed. Glucose stable and controlled  #5 COPD/ILD - no wheezing on exam - stable at present  #6 HIV - Most recent viral load 30 - Patient to follow up with  ID  #7 depression - Stable at present - Pamelor had initially been on hold secondary to drug interaction with amiodarone.  - Pamelor dose decreased  - Concerns regarding patient's prolonged QT  DVT prophylaxis: Lovenox subQ Code Status: Full Family Communication: Pt in room, family not at bedside Disposition Plan: Uncertain at this time  Consultants:   Cardiology  ID  Procedures:   TEE  Antimicrobials: Anti-infectives    Start     Dose/Rate Route Frequency Ordered Stop   06/06/16 1800  ceFAZolin (ANCEF) IVPB 2g/100 mL premix     2 g 200 mL/hr over 30 Minutes Intravenous Every 8 hours 06/06/16 1552     06/05/16 2000  vancomycin (VANCOCIN) 1,250 mg in sodium chloride 0.9 % 250 mL IVPB  Status:  Discontinued     1,250 mg 166.7 mL/hr over 90 Minutes Intravenous Every 12 hours 06/04/16 1810 06/06/16 1542   06/05/16 1500  cefTRIAXone (ROCEPHIN) 2 g in dextrose 5 % 50 mL IVPB  Status:  Discontinued     2 g 100 mL/hr over 30 Minutes Intravenous Every 24 hours 06/05/16 1404 06/06/16 1542   06/05/16 1415  metroNIDAZOLE (FLAGYL) tablet 500 mg  Status:  Discontinued     500 mg Oral Every 8 hours 06/05/16 1404 06/06/16 1542   06/04/16 2230  emtricitabine-tenofovir AF (DESCOVY) 200-25 MG per tablet 1 tablet     1 tablet Oral Daily 06/04/16 2218     06/04/16 2230  dolutegravir (TIVICAY) tablet 50 mg  50 mg Oral Daily 06/04/16 2218     06/04/16 1815  vancomycin (VANCOCIN) 2,000 mg in sodium chloride 0.9 % 500 mL IVPB     2,000 mg 250 mL/hr over 120 Minutes Intravenous  Once 06/04/16 1804 06/04/16 2136       Subjective: No complaints today  Objective: Vitals:   06/07/16 2256 06/08/16 0550 06/08/16 1121 06/08/16 1300  BP: 137/75 97/60  108/62  Pulse: 90 67  78  Resp: 16 18  18   Temp: 98.1 F (36.7 C) 98 F (36.7 C)  98.4 F (36.9 C)  TempSrc: Oral Oral  Oral  SpO2: 97% 92% 98% 97%  Weight:      Height:       No intake or output data in the 24 hours ending 06/08/16  1621 Filed Weights   06/04/16 1825 06/04/16 2100  Weight: 93.1 kg (205 lb 3 oz) 93 kg (205 lb)    Examination:  General exam: Appears calm and comfortable  Respiratory system: Clear to auscultation. Respiratory effort normal. Cardiovascular system: S1 & S2 heard, RRR. Gastrointestinal system: Abdomen is nondistended, soft and nontender. No organomegaly or masses felt. Normal bowel sounds heard. Central nervous system: Alert and oriented. No focal neurological deficits. Extremities: Symmetric 5 x 5 power. Skin: No rashes, lesions Psychiatry: Judgement and insight appear normal. Mood & affect appropriate.   Data Reviewed: I have personally reviewed following labs and imaging studies  CBC:  Recent Labs Lab 06/04/16 1640 06/05/16 0551 06/06/16 0520 06/07/16 0417 06/08/16 0341  WBC 8.3 4.0 2.9* 4.9 6.3  NEUTROABS 7.1  --   --  3.6  --   HGB 12.2* 10.6* 10.4* 10.9* 10.6*  HCT 36.9* 32.8* 32.5* 33.5* 33.0*  MCV 86.4 87.9 88.3 87.2 87.1  PLT 202 152 161 199 225   Basic Metabolic Panel:  Recent Labs Lab 06/04/16 1640 06/05/16 0551 06/06/16 0520 06/07/16 0417 06/08/16 0341  NA 132* 133* 135 136 135  K 3.7 3.5 3.5 4.2 3.9  CL 99* 101 100* 105 102  CO2 22 25 27 23 25   GLUCOSE 124* 87 108* 84 94  BUN 16 15 16 12 12   CREATININE 1.00 0.90 0.81 0.74 0.77  CALCIUM 9.4 8.7* 8.7* 9.1 9.3  MG  --  1.7 2.2 2.0  --   PHOS  --  3.3  --   --   --    GFR: Estimated Creatinine Clearance: 119.9 mL/min (by C-G formula based on SCr of 0.77 mg/dL). Liver Function Tests:  Recent Labs Lab 06/04/16 1640 06/05/16 0551  AST 17 11*  ALT 12* 10*  ALKPHOS 94 74  BILITOT 1.3* 1.0  PROT 8.4* 6.9  ALBUMIN 3.7 3.0*   No results for input(s): LIPASE, AMYLASE in the last 168 hours. No results for input(s): AMMONIA in the last 168 hours. Coagulation Profile: No results for input(s): INR, PROTIME in the last 168 hours. Cardiac Enzymes: No results for input(s): CKTOTAL, CKMB, CKMBINDEX,  TROPONINI in the last 168 hours. BNP (last 3 results) No results for input(s): PROBNP in the last 8760 hours. HbA1C: No results for input(s): HGBA1C in the last 72 hours. CBG:  Recent Labs Lab 06/07/16 0731 06/07/16 1145 06/07/16 1742 06/07/16 2255 06/08/16 0803  GLUCAP 74 138* 85 87 79   Lipid Profile: No results for input(s): CHOL, HDL, LDLCALC, TRIG, CHOLHDL, LDLDIRECT in the last 72 hours. Thyroid Function Tests: No results for input(s): TSH, T4TOTAL, FREET4, T3FREE, THYROIDAB in the last 72 hours. Anemia Panel: No  results for input(s): VITAMINB12, FOLATE, FERRITIN, TIBC, IRON, RETICCTPCT in the last 72 hours. Sepsis Labs:  Recent Labs Lab 06/04/16 1646 06/04/16 2024  LATICACIDVEN 1.80 0.85    Recent Results (from the past 240 hour(s))  Culture, blood (Routine X 2) w Reflex to ID Panel     Status: Abnormal   Collection Time: 06/04/16  4:29 PM  Result Value Ref Range Status   Specimen Description BLOOD BLOOD RIGHT FOREARM  Final   Special Requests BOTTLES DRAWN AEROBIC AND ANAEROBIC 5ML EA  Final   Culture  Setup Time   Final    GRAM POSITIVE COCCI IN CLUSTERS IN BOTH AEROBIC AND ANAEROBIC BOTTLES CRITICAL RESULT CALLED TO, READ BACK BY AND VERIFIED WITH: E JACKSON,PHARMD AT 2050 06/05/16 BY V WILKINS Performed [facility]    Culture STAPHYLOCOCCUS AUREUS (A)  Final   Report Status 06/07/2016 FINAL  Final   Organism ID, Bacteria STAPHYLOCOCCUS AUREUS  Final      Susceptibility   Staphylococcus aureus - MIC*    CIPROFLOXACIN <=0.5 SENSITIVE Sensitive     ERYTHROMYCIN <=0.25 SENSITIVE Sensitive     GENTAMICIN <=0.5 SENSITIVE Sensitive     OXACILLIN <=0.25 SENSITIVE Sensitive     TETRACYCLINE <=1 SENSITIVE Sensitive     VANCOMYCIN <=0.5 SENSITIVE Sensitive     TRIMETH/SULFA <=10 SENSITIVE Sensitive     CLINDAMYCIN <=0.25 SENSITIVE Sensitive     RIFAMPIN <=0.5 SENSITIVE Sensitive     Inducible Clindamycin NEGATIVE Sensitive     * STAPHYLOCOCCUS  AUREUS  Blood Culture ID Panel (Reflexed)     Status: Abnormal   Collection Time: 06/04/16  4:29 PM  Result Value Ref Range Status   Enterococcus species NOT DETECTED NOT DETECTED Final   Listeria monocytogenes NOT DETECTED NOT DETECTED Final   Staphylococcus species DETECTED (A) NOT DETECTED Final    Comment: CRITICAL RESULT CALLED TO, READ BACK BY AND VERIFIED WITH: E.JACKSON,PHARMD 06/05/16 @2050  BY V.WILKINS    Staphylococcus aureus DETECTED (A) NOT DETECTED Final    Comment: CRITICAL RESULT CALLED TO, READ BACK BY AND VERIFIED WITH: E.JACKSON,PHARMD 06/05/16 @2050  BY V.WILKINS    Methicillin resistance NOT DETECTED NOT DETECTED Final   Streptococcus species NOT DETECTED NOT DETECTED Final   Streptococcus agalactiae NOT DETECTED NOT DETECTED Final   Streptococcus pneumoniae NOT DETECTED NOT DETECTED Final   Streptococcus pyogenes NOT DETECTED NOT DETECTED Final   Acinetobacter baumannii NOT DETECTED NOT DETECTED Final   Enterobacteriaceae species NOT DETECTED NOT DETECTED Final   Enterobacter cloacae complex NOT DETECTED NOT DETECTED Final   Escherichia coli NOT DETECTED NOT DETECTED Final   Klebsiella oxytoca NOT DETECTED NOT DETECTED Final   Klebsiella pneumoniae NOT DETECTED NOT DETECTED Final   Proteus species NOT DETECTED NOT DETECTED Final   Serratia marcescens NOT DETECTED NOT DETECTED Final   Haemophilus influenzae NOT DETECTED NOT DETECTED Final   Neisseria meningitidis NOT DETECTED NOT DETECTED Final   Pseudomonas aeruginosa NOT DETECTED NOT DETECTED Final   Candida albicans NOT DETECTED NOT DETECTED Final   Candida glabrata NOT DETECTED NOT DETECTED Final   Candida krusei NOT DETECTED NOT DETECTED Final   Candida parapsilosis NOT DETECTED NOT DETECTED Final   Candida tropicalis NOT DETECTED NOT DETECTED Final    Comment: Performed at [facility]  Culture, blood (Routine X 2) w Reflex to ID Panel     Status: None (Preliminary result)   Collection Time:  06/04/16  5:45 PM  Result Value Ref Range Status  Specimen Description BLOOD RIGHT ANTECUBITAL  Final   Special Requests BOTTLES DRAWN AEROBIC AND ANAEROBIC  Final   Culture   Final    NO GROWTH 2 DAYS Performed at [facility]    Report Status PENDING  Incomplete  MRSA PCR Screening     Status: None   Collection Time: 06/05/16  6:30 AM  Result Value Ref Range Status   MRSA by PCR NEGATIVE NEGATIVE Final    Comment:        The GeneXpert MRSA Assay (FDA approved for NASAL specimens only), is one component of a comprehensive MRSA colonization surveillance program. It is not intended to diagnose MRSA infection nor to guide or monitor treatment for MRSA infections.   Aerobic/Anaerobic Culture (surgical/deep wound)     Status: None (Preliminary result)   Collection Time: 06/06/16  7:29 PM  Result Value Ref Range Status   Specimen Description ABSCESS  Final   Special Requests NONE  Final   Gram Stain   Final    FEW WBC PRESENT,BOTH PMN AND MONONUCLEAR NO ORGANISMS SEEN    Culture   Final    RARE STAPHYLOCOCCUS AUREUS SUSCEPTIBILITIES TO FOLLOW CRITICAL RESULT CALLED TO, READ BACK BY AND VERIFIED WITH: RN [facility] [id number] 1126AM MLM Performed at [facility]    Report Status PENDING  Incomplete     Radiology Studies: No results found.  Scheduled Meds: . arformoterol  15 mcg Nebulization BID  . aspirin  81 mg Oral Daily  . budesonide (PULMICORT) nebulizer solution  0.25 mg Nebulization BID  .  ceFAZolin (ANCEF) IV  2 g Intravenous Q8H  . clonazePAM  1 mg Oral QHS  . dolutegravir  50 mg Oral Daily  . emtricitabine-tenofovir AF  1 tablet Oral Daily  . enoxaparin (LOVENOX) injection  40 mg Subcutaneous Q24H  . feeding supplement (ENSURE ENLIVE)  237 mL Oral BID BM  . guaiFENesin  600 mg Oral BID  . insulin aspart  0-5 Units Subcutaneous QHS  . insulin aspart  0-9 Units Subcutaneous TID WC  . metoprolol tartrate  50 mg Oral BID  . morphine  30 mg  Oral Q12H  . multivitamin with minerals  1 tablet Oral Daily  . nicotine  21 mg Transdermal Daily  . nortriptyline  50 mg Oral QHS  . sodium chloride flush  3 mL Intravenous Q12H  . spironolactone  25 mg Oral Daily   Continuous Infusions: . sodium chloride 10 mL/hr at 06/07/16 0247     LOS: 4 days   CHIU, Scheryl Marten, MD [facility] Pager [phone]  If 7PM-7AM, please contact night-coverage [email or web address] Password [email or web address] 06/08/2016, 4:21 PM

## 2016-06-08 NOTE — Addendum Note (Signed)
Addendum  created 06/08/16 0450 by Florene Routeiana L Ysenia Filice, CRNA   Charge Capture section accepted

## 2016-06-09 ENCOUNTER — Ambulatory Visit (HOSPITAL_COMMUNITY): Admission: RE | Admit: 2016-06-09 | Payer: Medicare Other | Source: Ambulatory Visit | Admitting: Cardiology

## 2016-06-09 ENCOUNTER — Inpatient Hospital Stay (HOSPITAL_COMMUNITY): Payer: Medicare Other

## 2016-06-09 ENCOUNTER — Encounter (HOSPITAL_COMMUNITY): Payer: Self-pay | Admitting: *Deleted

## 2016-06-09 ENCOUNTER — Encounter (HOSPITAL_COMMUNITY)
Admission: EM | Disposition: A | Discharge status: Home Health | Payer: Self-pay | Source: Home / Self Care | Attending: Internal Medicine

## 2016-06-09 DIAGNOSIS — I340 Nonrheumatic mitral (valve) insufficiency: Secondary | ICD-10-CM

## 2016-06-09 DIAGNOSIS — Z79891 Long term (current) use of opiate analgesic: Secondary | ICD-10-CM

## 2016-06-09 DIAGNOSIS — Z794 Long term (current) use of insulin: Secondary | ICD-10-CM

## 2016-06-09 DIAGNOSIS — F331 Major depressive disorder, recurrent, moderate: Secondary | ICD-10-CM

## 2016-06-09 DIAGNOSIS — Z79899 Other long term (current) drug therapy: Secondary | ICD-10-CM

## 2016-06-09 DIAGNOSIS — Z808 Family history of malignant neoplasm of other organs or systems: Secondary | ICD-10-CM

## 2016-06-09 DIAGNOSIS — E440 Moderate protein-calorie malnutrition: Secondary | ICD-10-CM

## 2016-06-09 DIAGNOSIS — Z89511 Acquired absence of right leg below knee: Secondary | ICD-10-CM

## 2016-06-09 HISTORY — PX: TEE WITHOUT CARDIOVERSION: SHX5443

## 2016-06-09 LAB — GLUCOSE, CAPILLARY
GLUCOSE-CAPILLARY: 114 mg/dL — AB (ref 65–99)
Glucose-Capillary: 102 mg/dL — ABNORMAL HIGH (ref 65–99)
Glucose-Capillary: 93 mg/dL (ref 65–99)
Glucose-Capillary: 110 mg/dL — ABNORMAL HIGH (ref 65–99)

## 2016-06-09 SURGERY — ECHOCARDIOGRAM, TRANSESOPHAGEAL
Anesthesia: Moderate Sedation

## 2016-06-09 MED ORDER — DIPHENHYDRAMINE HCL 50 MG/ML IJ SOLN
INTRAMUSCULAR | Status: AC
  Filled 2016-06-09: qty 1

## 2016-06-09 MED ORDER — FENTANYL CITRATE (PF) 100 MCG/2ML IJ SOLN
INTRAMUSCULAR | Status: DC | PRN
  Administered 2016-06-09 (×3): 25 ug via INTRAVENOUS

## 2016-06-09 MED ORDER — SODIUM CHLORIDE 0.9 % IV SOLN: INTRAVENOUS | Status: DC

## 2016-06-09 MED ORDER — CLONAZEPAM 1 MG PO TABS
2.0000 mg | ORAL_TABLET | Freq: Every day | ORAL | Status: DC
  Administered 2016-06-09 – 2016-06-10 (×2): 2 mg via ORAL
  Filled 2016-06-09 (×2): qty 2

## 2016-06-09 MED ORDER — MIDAZOLAM HCL 5 MG/ML IJ SOLN
INTRAMUSCULAR | Status: AC
  Filled 2016-06-09: qty 2

## 2016-06-09 MED ORDER — MIDAZOLAM HCL 10 MG/2ML IJ SOLN
INTRAMUSCULAR | Status: DC | PRN
  Administered 2016-06-09 (×3): 2 mg via INTRAVENOUS

## 2016-06-09 MED ORDER — FENTANYL CITRATE (PF) 100 MCG/2ML IJ SOLN
INTRAMUSCULAR | Status: AC
  Filled 2016-06-09: qty 2

## 2016-06-09 MED ORDER — DIPHENHYDRAMINE HCL 50 MG/ML IJ SOLN
INTRAMUSCULAR | Status: DC | PRN
  Administered 2016-06-09 (×2): 25 mg via INTRAVENOUS

## 2016-06-09 NOTE — Progress Notes (Addendum)
[facility]   Reason for visit: Follow up on Staph aureus bacteremia  Interval History:  Repeat blood cultures 12/5 ngtd.  OR culture MSSA.  Feels well, no fever, no chills.   Physical Exam: Constitutional:  Vitals:   06/09/16 1040 06/09/16 1226  BP: 99/68 92/60  Pulse: 78 78  Resp: 20 16  Temp:  97.4 F (36.3 C)   patient appears in NAD Eyes: anicteric Respiratory: Normal respiratory effort; CTA B Cardiovascular: RRR MS: right leg wrapped   Review of Systems: Constitutional: negative for fevers, chills, sweats and malaise Gastrointestinal: negative for nausea and diarrhea  Lab Results  Component Value Date   WBC 6.3 06/08/2016   HGB 10.6 (L) 06/08/2016   HCT 33.0 (L) 06/08/2016   MCV 87.1 06/08/2016   PLT 225 06/08/2016    Lab Results  Component Value Date   CREATININE 0.77 06/08/2016   BUN 12 06/08/2016   NA 135 06/08/2016   K 3.9 06/08/2016   CL 102 06/08/2016   CO2 25 06/08/2016    Lab Results  Component Value Date   ALT 10 (L) 06/05/2016   AST 11 (L) 06/05/2016   ALKPHOS 74 06/05/2016     Microbiology: Recent Results (from the past 240 hour(s))  Culture, blood (Routine X 2) w Reflex to ID Panel     Status: Abnormal   Collection Time: 06/04/16  4:29 PM  Result Value Ref Range Status   Specimen Description BLOOD BLOOD RIGHT FOREARM  Final   Special Requests BOTTLES DRAWN AEROBIC AND ANAEROBIC 5ML EA  Final   Culture  Setup Time   Final    GRAM POSITIVE COCCI IN CLUSTERS IN BOTH AEROBIC AND ANAEROBIC BOTTLES CRITICAL RESULT CALLED TO, READ BACK BY AND VERIFIED WITH: E JACKSON,PHARMD AT 2050 06/05/16 BY V WILKINS Performed at [facility]    Culture STAPHYLOCOCCUS AUREUS (A)  Final   Report Status 06/07/2016 FINAL  Final   Organism ID, Bacteria STAPHYLOCOCCUS AUREUS  Final      Susceptibility   Staphylococcus aureus - MIC*    CIPROFLOXACIN <=0.5 SENSITIVE Sensitive     ERYTHROMYCIN <=0.25 SENSITIVE Sensitive      GENTAMICIN <=0.5 SENSITIVE Sensitive     OXACILLIN <=0.25 SENSITIVE Sensitive     TETRACYCLINE <=1 SENSITIVE Sensitive     VANCOMYCIN <=0.5 SENSITIVE Sensitive     TRIMETH/SULFA <=10 SENSITIVE Sensitive     CLINDAMYCIN <=0.25 SENSITIVE Sensitive     RIFAMPIN <=0.5 SENSITIVE Sensitive     Inducible Clindamycin NEGATIVE Sensitive     * STAPHYLOCOCCUS AUREUS  Blood Culture ID Panel (Reflexed)     Status: Abnormal   Collection Time: 06/04/16  4:29 PM  Result Value Ref Range Status   Enterococcus species NOT DETECTED NOT DETECTED Final   Listeria monocytogenes NOT DETECTED NOT DETECTED Final   Staphylococcus species DETECTED (A) NOT DETECTED Final    Comment: CRITICAL RESULT CALLED TO, READ BACK BY AND VERIFIED WITH: E.JACKSON,PHARMD 06/05/16 @2050  BY V.WILKINS    Staphylococcus aureus DETECTED (A) NOT DETECTED Final    Comment: CRITICAL RESULT CALLED TO, READ BACK BY AND VERIFIED WITH: E.JACKSON,PHARMD 06/05/16 @2050  BY V.WILKINS    Methicillin resistance NOT DETECTED NOT DETECTED Final   Streptococcus species NOT DETECTED NOT DETECTED Final   Streptococcus agalactiae NOT DETECTED NOT DETECTED Final   Streptococcus pneumoniae NOT DETECTED NOT DETECTED Final   Streptococcus pyogenes NOT DETECTED NOT DETECTED Final   Acinetobacter baumannii NOT DETECTED NOT DETECTED  Final   Enterobacteriaceae species NOT DETECTED NOT DETECTED Final   Enterobacter cloacae complex NOT DETECTED NOT DETECTED Final   Escherichia coli NOT DETECTED NOT DETECTED Final   Klebsiella oxytoca NOT DETECTED NOT DETECTED Final   Klebsiella pneumoniae NOT DETECTED NOT DETECTED Final   Proteus species NOT DETECTED NOT DETECTED Final   Serratia marcescens NOT DETECTED NOT DETECTED Final   Haemophilus influenzae NOT DETECTED NOT DETECTED Final   Neisseria meningitidis NOT DETECTED NOT DETECTED Final   Pseudomonas aeruginosa NOT DETECTED NOT DETECTED Final   Candida albicans NOT DETECTED NOT DETECTED Final   Candida  glabrata NOT DETECTED NOT DETECTED Final   Candida krusei NOT DETECTED NOT DETECTED Final   Candida parapsilosis NOT DETECTED NOT DETECTED Final   Candida tropicalis NOT DETECTED NOT DETECTED Final    Comment: Performed at [facility]  Culture, blood (Routine X 2) w Reflex to ID Panel     Status: None (Preliminary result)   Collection Time: 06/04/16  5:45 PM  Result Value Ref Range Status   Specimen Description BLOOD RIGHT ANTECUBITAL  Final   Special Requests BOTTLES DRAWN AEROBIC AND ANAEROBIC  Final   Culture   Final    NO GROWTH 4 DAYS Performed at [facility]    Report Status PENDING  Incomplete  MRSA PCR Screening     Status: None   Collection Time: 06/05/16  6:30 AM  Result Value Ref Range Status   MRSA by PCR NEGATIVE NEGATIVE Final    Comment:        The GeneXpert MRSA Assay (FDA approved for NASAL specimens only), is one component of a comprehensive MRSA colonization surveillance program. It is not intended to diagnose MRSA infection nor to guide or monitor treatment for MRSA infections.   Aerobic/Anaerobic Culture (surgical/deep wound)     Status: None (Preliminary result)   Collection Time: 06/06/16  7:29 PM  Result Value Ref Range Status   Specimen Description ABSCESS  Final   Special Requests NONE  Final   Gram Stain   Final    FEW WBC PRESENT,BOTH PMN AND MONONUCLEAR NO ORGANISMS SEEN Performed at [facility]    Culture   Final    RARE STAPHYLOCOCCUS AUREUS CRITICAL RESULT CALLED TO, READ BACK BY AND VERIFIED WITH: RN [facility] [id number] 1126AM MLM NO ANAEROBES ISOLATED; CULTURE IN PROGRESS FOR 5 DAYS    Report Status PENDING  Incomplete   Organism ID, Bacteria STAPHYLOCOCCUS AUREUS  Final      Susceptibility   Staphylococcus aureus - MIC*    CIPROFLOXACIN <=0.5 SENSITIVE Sensitive     ERYTHROMYCIN <=0.25 SENSITIVE Sensitive     GENTAMICIN <=0.5 SENSITIVE Sensitive     OXACILLIN <=0.25 SENSITIVE Sensitive     TETRACYCLINE  <=1 SENSITIVE Sensitive     VANCOMYCIN <=0.5 SENSITIVE Sensitive     TRIMETH/SULFA <=10 SENSITIVE Sensitive     CLINDAMYCIN <=0.25 SENSITIVE Sensitive     RIFAMPIN <=0.5 SENSITIVE Sensitive     Inducible Clindamycin NEGATIVE Sensitive     * RARE STAPHYLOCOCCUS AUREUS  Culture, blood (routine x 2)     Status: None (Preliminary result)   Collection Time: 06/07/16  4:17 AM  Result Value Ref Range Status   Specimen Description BLOOD RIGHT ANTECUBITAL  Final   Special Requests IN PEDIATRIC BOTTLE  Final   Culture   Final    NO GROWTH 2 DAYS Performed at [facility]    Report Status PENDING  Incomplete  Culture, blood (routine x 2)     Status: None (Preliminary result)   Collection Time: 06/07/16  4:17 AM  Result Value Ref Range Status   Specimen Description BLOOD RIGHT HAND  Final   Special Requests IN PEDIATRIC BOTTLE 2ML  Final   Culture   Final    NO GROWTH 2 DAYS Performed at [facility]    Report Status PENDING  Incomplete    Impression/Plan:  1. Osteomyelitis - amputation done. Good gross margins on OP report.  Needs 2 weeks IV cefazolin with 12/5 first negative culture through December 18.   Can pull picc line at the end of treatment on December 18th [facility], I will order 2.  MSSA bacteremia - TEEnegative for vegetation.  2 weeks IV antibiotics.  3.  HIV - on Tivicay/Descovy and doing well.  Viral load suppressed.   I will sign off, thanks

## 2016-06-09 NOTE — Discharge Instructions (Signed)

## 2016-06-09 NOTE — Consult Note (Signed)
[facility] Consult   Reason for Consult:  QTC prolongation and taking TCA's Referring Physician:  Dr. Wyline Copas Patient Identification: [name] MRN:  [id number] Principal Diagnosis: Foot ulcer, right [facility]) Diagnosis:   Patient Active Problem List   Diagnosis Date Noted  . Bacteremia due to Staphylococcus aureus [R78.81] 06/07/2016  . Malnutrition of moderate degree [E44.0] 06/06/2016  . Osteomyelitis of foot, right, acute ([place]) [M86.171] 06/05/2016  . Foot ulcer, right ([place]) [L97.519]   . Pyogenic inflammation of bone ([place]) [M86.9] 06/04/2016  . Cellulitis [L03.90] 06/04/2016  . AIDS ([place]) [B20] 06/09/2015  . 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 GERD (gastroesophageal reflux disease) [K21.9] 06/09/2015  . COPD exacerbation ([place]) [J44.1] 12/08/2014  . Folliculitis barbae [Z02.5] 12/08/2014  . Rhinosinusitis [J32.9] 12/08/2014  . ILD (interstitial lung disease) ([place]) [J84.9] 12/08/2014  . Neuropathic foot ulcer ([place]) [L97.509] 10/29/2014  . PTSD (post-traumatic stress disorder) [F43.10] 09/23/2013  . Bilateral lower extremity edema- hx venous RFA 9/14 [R60.0] 09/23/2013  . VT (ventricular tachycardia) ([place]) [I47.2] 09/23/2013  . Falls [phone].[id number]] 06/12/2013  . Reflux [K21.9] 05/07/2012  . Mental status change [R41.82] 02/02/2011  . TOBACCO ABUSE [F17.200] 01/25/2010  . Varicose veins of lower extremities with ulcer ([place]) [I83.009] 08/11/2009  . DERMATITIS NOS [L25.9] 01/21/2008  . Chest pressure - when in VT [R07.89] 12/18/2007  . DIARRHEA [R19.7] 11/22/2007  . Type 2 diabetes, controlled, with peripheral neuropathy ([place]) [E11.42] 04/13/2006  . Depression [F32.9] 04/13/2006  . NEUROPATHY [G58.9] 04/13/2006  . COPD with acute bronchitis ([place]) [J44.0, J20.9] 04/13/2006  . 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 HEPATITIS B, HX OF [Z86.19] 04/13/2006    Total Time  spent with patient: 45 minutes  Subjective:   [name] is a 55 y.o. male patient admitted with foot ulcer.  HPI:  [name] is a 55 y.o. male, seen, chart reviewed and case discussed with the Dr. Wyline Copas and psychiatric LCSW. Patient appeared awake, alert oriented to time place person and situation and also sitting in a chair next to the bed. Patient reported he has been suffering with depression which is chronic and non-TCA antidepressants does not work for him. Patient stated he does not like taking nortriptyline 150 mg daily because of known cardiac toxicity associated with the medication but reportedly on the other SSRIs, SNRI's trials failed. Some of the medication made him more manic than other medications. Patient is willing to give a trial of new antidepressant medication if it is available for him. Patient reported his father and brother also has been suffering with the depression and takes amitriptyline which is more helpful to them. Patient is willing to take [place] a dose of nortriptyline because of the concerns about QTC prolongation during this hospitalization and also requesting 2 mg of clonazepam at bedtime for better control off anxiety and insomnia. Patient has no acute symptoms of depression, anxiety, mania, psychosis. Patient has no suicidal/homicidal ideation, intention or plans. Patient has been taking pain medication secondary to foot surgery which are somewhat helpful. Patient also suffering with neuropathies but denied any help from Cymbalta and gabapentin trials in the past.    Past Psychiatric History: PTSD and depression, Patient follow-up with Dr Casimiro Needle outpatient psychiatric medication management.   Risk to Self: Is patient at risk for suicide?: No Risk to Others:   Prior Inpatient Therapy:   Prior Outpatient Therapy:    Past  Medical History:  Past Medical History:  Diagnosis Date  . Cholesteatoma   . 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 COPD (chronic obstructive pulmonary disease) ([place])   . Depression   . Folliculitis barbae 0/12/2692  . GERD (gastroesophageal reflux disease) 06/09/2015  . HIV (human immunodeficiency virus infection) ([place])   . ILD (interstitial lung disease) ([place]) 12/08/2014  . Marfan syndrome    Dr Johnnye Sima follows  . Mitral valve prolapse   . Rhinosinusitis 12/08/2014  . Venous insufficiency (chronic) (peripheral)   . VT (ventricular tachycardia) ([place])     Past Surgical History:  Procedure Laterality Date  . AMPUTATION Right 06/06/2016   Procedure: AMPUTATION RAY RIGHT FIFTH RAY;  Surgeon: Newt Minion, MD;  Location: [facility] ORS;  Service: Orthopedics;  Laterality: Right;  . APPENDECTOMY    . cholesteotoma removal    . LEFT HEART CATHETERIZATION WITH CORONARY ANGIOGRAM [name] 09/24/2013   Procedure: LEFT HEART CATHETERIZATION WITH CORONARY ANGIOGRAM;  Surgeon: Leonie Man, MD;  Location: [facility] CATH LAB;  Service: Cardiovascular;  Laterality: [name];  . repair of perforated colon     Family History:  Family History  Problem Relation Age of Onset  . Cancer Mother   . Hypertension Mother   . Stroke Sister   . Hypertension Sister   . Aneurysm Sister    Family Psychiatric  History: Significant for Depression and nonresponder to the SSRIs and SNRI's.  Social History:  History  Alcohol Use No     History  Drug Use No    Social History   Social History  . Marital status: Single    Spouse name: [name]  . Number of children: [name]  . Years of education: [name]   Social History Main Topics  . Smoking status: Current Some Day Smoker    Packs/day: 0.25    Years: 35.00    Types: Cigarettes  . Smokeless tobacco: Never Used     Comment: trying to cut back  . Alcohol use No  . Drug use: No  . Sexual activity: No     Comment: pt. declined condoms   Other Topics Concern  . None   Social History Narrative  . None   Additional Social History:    Allergies:   Allergies  Allergen Reactions  .  Tetracycline Hcl Swelling    Lips, facial swelling    Labs:  Results for orders placed or performed during the hospital encounter of 06/04/16 (from the past 48 hour(s))  Glucose, capillary     Status: None   Collection Time: 06/07/16  5:42 PM  Result Value Ref Range   Glucose-Capillary 85 65 - 99 mg/dL  Glucose, capillary     Status: None   Collection Time: 06/07/16 10:55 PM  Result Value Ref Range   Glucose-Capillary 87 65 - 99 mg/dL  CBC     Status: Abnormal   Collection Time: 06/08/16  3:41 AM  Result Value Ref Range   WBC 6.3 4.0 - 10.5 K/uL   RBC 3.79 (L) 4.22 - 5.81 MIL/uL   Hemoglobin 10.6 (L) 13.0 - 17.0 g/dL   HCT 33.0 (L) 39.0 - 52.0 %   MCV 87.1 78.0 - 100.0 fL   MCH 28.0 26.0 - 34.0 pg   MCHC 32.1 30.0 - 36.0 g/dL   RDW 15.1 11.5 - 15.5 %   Platelets 225 150 - 400 K/uL  Basic metabolic panel     Status: None   Collection Time:  06/08/16  3:41 AM  Result Value Ref Range   Sodium 135 135 - 145 mmol/L   Potassium 3.9 3.5 - 5.1 mmol/L   Chloride 102 101 - 111 mmol/L   CO2 25 22 - 32 mmol/L   Glucose, Bld 94 65 - 99 mg/dL   BUN 12 6 - 20 mg/dL   Creatinine, Ser 0.77 0.61 - 1.24 mg/dL   Calcium 9.3 8.9 - 10.3 mg/dL   GFR calc non Af Amer >60 >60 mL/min   GFR calc Af Amer >60 >60 mL/min    Comment: (NOTE) The eGFR has been calculated using the CKD EPI equation. This calculation has not been validated in all clinical situations. eGFR's persistently <60 mL/min signify possible Chronic Kidney Disease.    Anion gap 8 5 - 15  Glucose, capillary     Status: None   Collection Time: 06/08/16  8:03 AM  Result Value Ref Range   Glucose-Capillary 79 65 - 99 mg/dL  Glucose, capillary     Status: Abnormal   Collection Time: 06/08/16  5:24 PM  Result Value Ref Range   Glucose-Capillary 119 (H) 65 - 99 mg/dL  Glucose, capillary     Status: None   Collection Time: 06/08/16  9:22 PM  Result Value Ref Range   Glucose-Capillary 89 65 - 99 mg/dL  Glucose, capillary      Status: Abnormal   Collection Time: 06/09/16  7:38 AM  Result Value Ref Range   Glucose-Capillary 102 (H) 65 - 99 mg/dL  Glucose, capillary     Status: Abnormal   Collection Time: 06/09/16 11:53 AM  Result Value Ref Range   Glucose-Capillary 114 (H) 65 - 99 mg/dL    Current Facility-Administered Medications  Medication Dose Route Frequency Provider Last Rate Last Dose  . 0.9 %  sodium chloride infusion   Intravenous Continuous Newt Minion, MD 10 mL/hr at 06/07/16 0247    . acetaminophen (TYLENOL) tablet 650 mg  650 mg Oral Q6H PRN Newt Minion, MD       Or  . acetaminophen (TYLENOL) suppository 650 mg  650 mg Rectal Q6H PRN Newt Minion, MD      . albuterol (PROVENTIL) (2.5 MG/3ML) 0.083% nebulizer solution 2.5 mg  2.5 mg Nebulization Q2H PRN Toy Baker, MD   2.5 mg at 06/08/16 1121  . ALPRAZolam Duanne Moron) tablet 0.5 mg  0.5 mg Oral TID PRN Toy Baker, MD   0.5 mg at 06/09/16 0827  . arformoterol (BROVANA) nebulizer solution 15 mcg  15 mcg Nebulization BID Eugenie Filler, MD   15 mcg at 06/08/16 1951  . aspirin chewable tablet 81 mg  81 mg Oral Daily Toy Baker, MD   81 mg at 06/09/16 1113  . benzonatate (TESSALON) capsule 200 mg  200 mg Oral TID PRN Eugenie Filler, MD      . budesonide (PULMICORT) nebulizer solution 0.25 mg  0.25 mg Nebulization BID Eugenie Filler, MD   0.25 mg at 06/08/16 1951  . ceFAZolin (ANCEF) IVPB 2g/100 mL premix  2 g Intravenous Q8H Luiz Ochoa, RPH   2 g at 06/09/16 1149  . clonazePAM (KLONOPIN) tablet 1 mg  1 mg Oral QHS Toy Baker, MD   1 mg at 06/08/16 2127  . dolutegravir (TIVICAY) tablet 50 mg  50 mg Oral Daily Toy Baker, MD   50 mg at 06/09/16 1113  . emtricitabine-tenofovir AF (DESCOVY) 200-25 MG per tablet 1 tablet  1 tablet Oral Daily Anastassia  Doutova, MD   1 tablet at 06/09/16 1113  . enoxaparin (LOVENOX) injection 40 mg  40 mg Subcutaneous Q24H Toy Baker, MD   40 mg at 06/08/16 2127  .  feeding supplement (ENSURE ENLIVE) (ENSURE ENLIVE) liquid 237 mL  237 mL Oral BID BM Anastassia Doutova, MD   237 mL at 06/08/16 1000  . guaiFENesin (MUCINEX) 12 hr tablet 600 mg  600 mg Oral BID Toy Baker, MD   600 mg at 06/08/16 2127  . HYDROcodone-acetaminophen (NORCO/VICODIN) 5-325 MG per tablet 1-2 tablet  1-2 tablet Oral Q4H PRN Toy Baker, MD   2 tablet at 06/09/16 0827  . HYDROmorphone (DILAUDID) injection 1 mg  1 mg Intravenous Q2H PRN Newt Minion, MD      . insulin aspart (novoLOG) injection 0-5 Units  0-5 Units Subcutaneous QHS Toy Baker, MD      . insulin aspart (novoLOG) injection 0-9 Units  0-9 Units Subcutaneous TID WC Toy Baker, MD   2 Units at 06/07/16 1305  . methocarbamol (ROBAXIN) tablet 500 mg  500 mg Oral Q6H PRN Newt Minion, MD       Or  . methocarbamol (ROBAXIN) 500 mg in dextrose 5 % 50 mL IVPB  500 mg Intravenous Q6H PRN Newt Minion, MD      . metoCLOPramide (REGLAN) tablet 5-10 mg  5-10 mg Oral Q8H PRN Newt Minion, MD       Or  . metoCLOPramide (REGLAN) injection 5-10 mg  5-10 mg Intravenous Q8H PRN Newt Minion, MD      . metoprolol (LOPRESSOR) tablet 50 mg  50 mg Oral BID Eugenie Filler, MD   50 mg at 06/09/16 1113  . morphine (MS CONTIN) 12 hr tablet 30 mg  30 mg Oral Q12H Toy Baker, MD   30 mg at 06/09/16 0836  . multivitamin with minerals tablet 1 tablet  1 tablet Oral Daily Eugenie Filler, MD   1 tablet at 06/09/16 1113  . nicotine (NICODERM CQ - dosed in mg/24 hours) patch 21 mg  21 mg Transdermal Daily Toy Baker, MD   21 mg at 06/09/16 1112  . nortriptyline (PAMELOR) capsule 50 mg  50 mg Oral QHS Eugenie Filler, MD   50 mg at 06/08/16 2127  . oxyCODONE (Oxy IR/ROXICODONE) immediate release tablet 5-10 mg  5-10 mg Oral Q3H PRN Newt Minion, MD   10 mg at 06/08/16 0225  . sodium chloride flush (NS) 0.9 % injection 3 mL  3 mL Intravenous Q12H Toy Baker, MD   3 mL at 06/08/16 2129  .  spironolactone (ALDACTONE) tablet 25 mg  25 mg Oral Daily Toy Baker, MD   25 mg at 06/09/16 1113    Musculoskeletal: Strength & Muscle Tone: within normal limits Gait & Station: normal Patient leans: Right  Psychiatric Specialty Exam: Physical Exam as per history and physical   ROS  Complaining about depression anxiety, insomnia and neuropathic pains and also pains from the recent surgery of his foot. No Fever-chills, No Headache, No changes with Vision or hearing, reports vertigo No problems swallowing food or Liquids, No Chest pain, Cough or Shortness of Breath, No Abdominal pain, No Nausea or Vommitting, Bowel movements are regular, No Blood in stool or Urine, No dysuria, No new skin rashes or bruises, No new joints pains-aches,  No new weakness, tingling, numbness in any extremity, No recent weight gain or loss, No polyuria, polydypsia or polyphagia,   A full 10  point Review of Systems was done, except as stated above, all other Review of Systems were negative.   Blood pressure 92/60, pulse 78, temperature 97.4 F (36.3 C), temperature source Oral, resp. rate 16, height '6\' 2"'  (1.88 m), weight 93 kg (205 lb), SpO2 99 %.Body mass index is 26.32 kg/m.  General Appearance: Casual  Eye Contact:  Good  Speech:  Pressured  Volume:  Increased  Mood:  Anxious  Affect:  Appropriate and Congruent  Thought Process:  Coherent and Goal Directed  Orientation:  Full (Time, Place, and Person)  Thought Content:  WDL  Suicidal Thoughts:  No  Homicidal Thoughts:  No  Memory:  Immediate;   Good Recent;   Fair Remote;   Fair  Judgement:  Intact  Insight:  Good  Psychomotor Activity:  Normal  Concentration:  Concentration: Good and Attention Span: Good  Recall:  Good  Fund of Knowledge:  Good  Language:  Good  Akathisia:  Negative  Handed:  Right  AIMS (if indicated):     Assets:  Communication Skills Desire for Improvement Financial  Resources/Insurance Housing Leisure Time Resilience Social Support Transportation  ADL's:  Intact  Cognition:  WNL  Sleep:        Treatment Plan Summary: 55 years old male with a history of posttraumatic stress disorder, depression admitted for osteomyelitis of the foot required amputation. Patient has been taking tricyclic antidepressant medication nortriptyline 150 mg daily at bedtime and his CKs indicated QTC prolongation at 512. Patient is elevated off cardiac toxicity of his medication but reportedly no other medications helpful to control his symptoms of PTSD and depression.   Patient has no safety concerns   Patient is willing to decrease his medication to 50-75 mg and also requested clonazepam 2 mg at bedtime for controlling his anxiety and insomnia.  Daily contact with patient to assess and evaluate symptoms and progress in treatment and Medication management   Patient will be referred to the outpatient psychiatric medication management and medically stable.  Appreciate psychiatric consultation and we sign off as of today Please contact [phone] or [phone] if needs further assistance   Disposition: No evidence of imminent risk to self or others at present.   Supportive therapy provided about ongoing stressors.  Ambrose Finland, MD 06/09/2016 12:51 PM

## 2016-06-09 NOTE — Progress Notes (Signed)
Pharmacy Antibiotic Note  Johnathan LarsenGary W [name] is a 55 y.o. male with PMH of admitted on 06/04/2016 with right foot ulcer/osteomyelitis, s/p right foot fifth ray amputation on 06/06/2016. Patient initially placed on Vancomycin, Ceftriaxone, and Metronidazole, and then subsequently narrowed to Cefazolin per ID as blood culture growing MSSA. TTE negative for obvious vegetation. TEE planned for today. Pharmacy has been consulted for dosing of Cefazolin.  Plan: D6 antibiotics, D4 Cefazolin Continue Cefazolin 2g IV q8h. Monitor renal function, cultures, clinical course. F/u TEE results and ID recs regarding duration of therapy.   Height: 6\' 2"  (188 cm) Weight: 205 lb (93 kg) IBW/kg (Calculated) : 82.2  Temp (24hrs), Avg:98.1 F (36.7 C), Min:97.9 F (36.6 C), Max:98.4 F (36.9 C)   Recent Labs Lab 06/04/16 1640 06/04/16 1646 06/04/16 2024 06/05/16 0551 06/06/16 0520 06/07/16 0417 06/08/16 0341  WBC 8.3  --   --  4.0 2.9* 4.9 6.3  CREATININE 1.00  --   --  0.90 0.81 0.74 0.77  LATICACIDVEN  --  1.80 0.85  --   --   --   --     Estimated Creatinine Clearance: 119.9 mL/min (by C-G formula based on SCr of 0.77 mg/dL).    Allergies  Allergen Reactions  . Tetracycline Hcl Swelling    Lips, facial swelling    Antimicrobials this admission: 12/2 >> Vancomycin >> 12/4 12/3 >> Ceftriaxone >> 12/4 12/3 >> Metronidazole >> 12/4 12/4 >> Cefazolin >>  Dose adjustments this admission: --  Microbiology results: 12/2BCx: 1/2 sets with MSSA 12/3MRSA PCR: negative 12/4 abscess Cx: rare Staph aureus 12/5 repeat BCx: sent  Thank you for allowing pharmacy to be a part of this patient's care.    Greer PickerelJigna Elga Santy, PharmD, BCPS Pager: [phone] 06/09/2016 7:43 AM

## 2016-06-09 NOTE — Progress Notes (Signed)
Physical Therapy Treatment Patient Details Name: Johnathan LarsenGary W [name] MRN: [id number] DOB: 15-Oct-1960 Today's Date: 06/09/2016    History of Present Illness [name] is a 55 y.o. male adm with worsening nonhealing right foot ulcer  recently had MRI done that showed osteomyelitis, s/p R 5th Ray amp;  medical history significant of AIDS, DM2 ,COPD,     PT Comments    Pt assisted with ambulating again in hallway and does not maintain NWB status so reinforced at least heel weight bearing, always wear Darco shoe when up standing as well as RW.  Pt recalls elevation education from previous session however was sitting with feet on floor upon arrival, so repositioned with reclined LEs and R LE elevated on pillows.  Pt reports he has no room for a knee scooter in his apt and would be more functional with a walker which he states he owns.   Follow Up Recommendations  Home health PT;Supervision for mobility/OOB     Equipment Recommendations  None recommended by PT    Recommendations for Other Services       Precautions / Restrictions Precautions Precaution Comments: has Darco shoe on in room today Restrictions Weight Bearing Restrictions: Yes RLE Weight Bearing: Non weight bearing Other Position/Activity Restrictions: reeducated pt on NWB, reason for and importance of this    Mobility  Bed Mobility               General bed mobility comments: pt up in recliner on arrival  Transfers Overall transfer level: Needs assistance Equipment used: Rolling walker (2 wheeled) Transfers: Sit to/from Stand Sit to Stand: Min guard         General transfer comment: pt stood without RW in front of him, cues to have RW in place first, verbal cues for safe technique  Ambulation/Gait Ambulation/Gait assistance: Min guard Ambulation Distance (Feet): 180 Feet Assistive device: Rolling walker (2 wheeled)       General Gait Details: cues for sequence and NWB however pt remains non-compliant so  reinforced heel weight bearing only (wearing Darco shoe today) no LOB episodes today   Stairs            Wheelchair Mobility    Modified Rankin (Stroke Patients Only)       Balance                                    Cognition Arousal/Alertness: Awake/alert Behavior During Therapy: WFL for tasks assessed/performed Overall Cognitive Status: Within Functional Limits for tasks assessed                      Exercises      General Comments        Pertinent Vitals/Pain Pain Assessment: No/denies pain    Home Living                      Prior Function            PT Goals (current goals can now be found in the care plan section) Progress towards PT goals: Progressing toward goals    Frequency    Min 3X/week      PT Plan Current plan remains appropriate    Co-evaluation             End of Session Equipment Utilized During Treatment: Gait belt Activity Tolerance: Patient tolerated treatment well Patient left: with  call bell/phone within reach;with chair alarm set;in chair     Time: 1357-1411 PT Time Calculation (min) (ACUTE ONLY): 14 min  Charges:  $Gait Training: 8-22 mins                    G Codes:      [name],[name] 06/09/2016, 2:59 PM [name], PT, DPT 06/09/2016 Pager: [phone]

## 2016-06-09 NOTE — Progress Notes (Signed)
PROGRESS NOTE    [name]  WUJ:[id number] DOB: 1960-12-18 DOA: 06/04/2016 PCP: Johny Sax, MD   Brief Narrative:  55 y.o. 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 - Patient seen by Dr. Lajoyce Corners and pt underwent R fifth ray amputation on 12/4 - ID consulted - Patient continued on IV ancef from vanc, rocephin, flagyl - Blood cx pos for MSSA bacteremia - Patient underwent TEE on 12/7 with no evidence of vegetations - ID recommendations noted for 2 weeks of cefazolin to complete through 12/18, may pull PICC after tx on 12/18  #2 V. tach - Noted to have episodes of nonsustained vtach. - Cardiology consulted with recommendations for d/c on lopressor 25mg  bid and f/u with Dr. Ladona Ridgel - Patient remains stable  #3 MSSA bacteremia - 2d echo neg for vegetations - Repeat blood cx negative - Patient continues on ancef  - TEE neg for vegetations  #4 Hyperlipidemia versus diabetes. - Last hemoglobin A1c was 5.5 on 03/25/2009.  - glucose trends remain stable  #5 COPD/ILD - no wheezing on exam today - remains stable on minimal O2 support  #6 HIV - Most recent viral  load of 30 - Will have patient follow up with ID on discharge  #7 depression - Remains stable currently - Pamelor had initially been on hold secondary to drug interaction with amiodarone.  - Pamelor dose decreased  - Consulted Psychiatry for medication dose adjustment. Appreciate input. Psychiatry recommends decreasing medication to 50-75mg  with clonazepam 2mg  qhs for anxiety and insomnia  DVT prophylaxis: Lovenox subQ Code Status: Full Family Communication: Pt in room, family at bedside Disposition Plan: Uncertain at this time  Consultants:   Cardiology  ID  Procedures:   TEE  Antimicrobials: Anti-infectives    Start     Dose/Rate Route Frequency Ordered Stop   06/06/16 1800  ceFAZolin (ANCEF) IVPB 2g/100 mL premix     2 g 200 mL/hr over 30 Minutes Intravenous Every 8 hours 06/06/16 1552     06/05/16 2000  vancomycin (VANCOCIN) 1,250 mg in sodium chloride 0.9 % 250 mL IVPB  Status:  Discontinued     1,250 mg 166.7 mL/hr over 90 Minutes Intravenous Every 12 hours 06/04/16 1810 06/06/16 1542   06/05/16 1500  cefTRIAXone (ROCEPHIN) 2 g in dextrose 5 % 50 mL IVPB  Status:  Discontinued     2 g 100 mL/hr over 30 Minutes Intravenous Every 24 hours 06/05/16 1404 06/06/16 1542   06/05/16 1415  metroNIDAZOLE (FLAGYL) tablet 500 mg  Status:  Discontinued     500 mg Oral Every 8 hours 06/05/16 1404 06/06/16 1542   06/04/16 2230  emtricitabine-tenofovir AF (DESCOVY) 200-25 MG per tablet 1 tablet  1 tablet Oral Daily 06/04/16 2218     06/04/16 2230  dolutegravir (TIVICAY) tablet 50 mg     50 mg Oral Daily 06/04/16 2218     06/04/16 1815  vancomycin (VANCOCIN) 2,000 mg in sodium chloride 0.9 % 500 mL IVPB     2,000 mg 250 mL/hr over 120 Minutes Intravenous  Once 06/04/16 1804 06/04/16 2136      Subjective: Without complaints  Objective: Vitals:   06/09/16 1022 06/09/16 1030 06/09/16 1040 06/09/16 1226  BP:  100/63 99/68 92/60   Pulse: 77 76 78 78  Resp: 14 16 20 16     Temp:    97.4 F (36.3 C)  TempSrc:    Oral  SpO2: 96% 97% 96% 99%  Weight:      Height:        Intake/Output Summary (Last 24 hours) at 06/09/16 1831 Last data filed at 06/09/16 1100  Gross per 24 hour  Intake             1580 ml  Output                0 ml  Net             1580 ml   Filed Weights   06/04/16 1825 06/04/16 2100  Weight: 93.1 kg (205 lb 3 oz) 93 kg (205 lb)    Examination:  General exam: Sitting in chair, in nad Respiratory system: normal resp effort, no audible wheezing Cardiovascular system: regular rate, s1, s2 Gastrointestinal system: Soft, nondistended, pos BS Central nervous system: CN2-12 grossly intact, strength/sensation intact Extremities: no clubbing, no cyanosis Skin: normal skin turgor, no notable skin lesion seen Psychiatry: mood normal// no visual hallucinations  Data Reviewed: I have personally reviewed following labs and imaging studies  CBC:  Recent Labs Lab 06/04/16 1640 06/05/16 0551 06/06/16 0520 06/07/16 0417 06/08/16 0341  WBC 8.3 4.0 2.9* 4.9 6.3  NEUTROABS 7.1  --   --  3.6  --   HGB 12.2* 10.6* 10.4* 10.9* 10.6*  HCT 36.9* 32.8* 32.5* 33.5* 33.0*  MCV 86.4 87.9 88.3 87.2 87.1  PLT 202 152 161 199 225   Basic Metabolic Panel:  Recent Labs Lab 06/04/16 1640 06/05/16 0551 06/06/16 0520 06/07/16 0417 06/08/16 0341  NA 132* 133* 135 136 135  K 3.7 3.5 3.5 4.2 3.9  CL 99* 101 100* 105 102  CO2 22 25 27 23 25   GLUCOSE 124* 87 108* 84 94  BUN 16 15 16 12 12   CREATININE 1.00 0.90 0.81 0.74 0.77  CALCIUM 9.4 8.7* 8.7* 9.1 9.3  MG  --  1.7 2.2 2.0  --   PHOS  --  3.3  --   --   --    GFR: Estimated Creatinine Clearance: 119.9 mL/min (by C-G formula based on SCr of 0.77 mg/dL). Liver Function Tests:  Recent Labs Lab 06/04/16 1640 06/05/16 0551  AST 17 11*  ALT 12* 10*  ALKPHOS 94 74  BILITOT 1.3* 1.0  PROT 8.4* 6.9  ALBUMIN 3.7 3.0*   No results for input(s): LIPASE, AMYLASE in the last 168 hours. No  results for input(s): AMMONIA in the last 168 hours. Coagulation Profile: No results for input(s): INR, PROTIME in the last 168 hours. Cardiac Enzymes: No results for input(s): CKTOTAL, CKMB, CKMBINDEX, TROPONINI in the last 168 hours. BNP (last 3 results) No results for input(s): PROBNP in the last 8760 hours. HbA1C: No results for input(s): HGBA1C in the last 72 hours.  CBG:  Recent Labs Lab 06/08/16 1724 06/08/16 2122 06/09/16 0738 06/09/16 1153 06/09/16 1725  GLUCAP 119* 89 102* 114* 93   Lipid Profile: No results for input(s): CHOL, HDL, LDLCALC, TRIG, CHOLHDL, LDLDIRECT in the last 72 hours. Thyroid Function Tests: No results for input(s): TSH, T4TOTAL, FREET4, T3FREE, THYROIDAB in the last 72 hours. Anemia Panel: No results for input(s): VITAMINB12, FOLATE, FERRITIN, TIBC, IRON, RETICCTPCT in the last 72 hours. Sepsis Labs:  Recent Labs Lab 06/04/16 1646 06/04/16 2024  LATICACIDVEN 1.80 0.85    Recent Results (from the past 240 hour(s))  Culture, blood (Routine X 2) w Reflex to ID Panel     Status: Abnormal   Collection Time: 06/04/16  4:29 PM  Result Value Ref Range Status   Specimen Description BLOOD BLOOD RIGHT FOREARM  Final   Special Requests BOTTLES DRAWN AEROBIC AND ANAEROBIC EA  Final   Culture  Setup Time   Final    GRAM POSITIVE COCCI IN CLUSTERS IN BOTH AEROBIC AND ANAEROBIC BOTTLES CRITICAL RESULT CALLED TO, READ BACK BY AND VERIFIED WITH: E JACKSON,PHARMD AT 2050 06/05/16 BY V WILKINS Performed at [facility]    Culture STAPHYLOCOCCUS AUREUS (A)  Final   Report Status 06/07/2016 FINAL  Final   Organism ID, Bacteria STAPHYLOCOCCUS AUREUS  Final      Susceptibility   Staphylococcus aureus - MIC*    CIPROFLOXACIN <=0.5 SENSITIVE Sensitive     ERYTHROMYCIN <=0.25 SENSITIVE Sensitive     GENTAMICIN <=0.5 SENSITIVE Sensitive     OXACILLIN <=0.25 SENSITIVE Sensitive     TETRACYCLINE <=1 SENSITIVE Sensitive     VANCOMYCIN <=0.5 SENSITIVE  Sensitive     TRIMETH/SULFA <=10 SENSITIVE Sensitive     CLINDAMYCIN <=0.25 SENSITIVE Sensitive     RIFAMPIN <=0.5 SENSITIVE Sensitive     Inducible Clindamycin NEGATIVE Sensitive     * STAPHYLOCOCCUS AUREUS  Blood Culture ID Panel (Reflexed)     Status: Abnormal   Collection Time: 06/04/16  4:29 PM  Result Value Ref Range Status   Enterococcus species NOT DETECTED NOT DETECTED Final   Listeria monocytogenes NOT DETECTED NOT DETECTED Final   Staphylococcus species DETECTED (A) NOT DETECTED Final    Comment: CRITICAL RESULT CALLED TO, READ BACK BY AND VERIFIED WITH: E.JACKSON,PHARMD 06/05/16 @2050  BY V.WILKINS    Staphylococcus aureus DETECTED (A) NOT DETECTED Final    Comment: CRITICAL RESULT CALLED TO, READ BACK BY AND VERIFIED WITH: E.JACKSON,PHARMD 06/05/16 @2050  BY V.WILKINS    Methicillin resistance NOT DETECTED NOT DETECTED Final   Streptococcus species NOT DETECTED NOT DETECTED Final   Streptococcus agalactiae NOT DETECTED NOT DETECTED Final   Streptococcus pneumoniae NOT DETECTED NOT DETECTED Final   Streptococcus pyogenes NOT DETECTED NOT DETECTED Final   Acinetobacter baumannii NOT DETECTED NOT DETECTED Final   Enterobacteriaceae species NOT DETECTED NOT DETECTED Final   Enterobacter cloacae complex NOT DETECTED NOT DETECTED Final   Escherichia coli NOT DETECTED NOT DETECTED Final   Klebsiella oxytoca NOT DETECTED NOT DETECTED Final   Klebsiella pneumoniae NOT DETECTED NOT DETECTED Final   Proteus species NOT DETECTED NOT DETECTED Final   Serratia marcescens NOT DETECTED NOT DETECTED Final   Haemophilus influenzae NOT DETECTED NOT DETECTED Final   Neisseria meningitidis NOT DETECTED NOT DETECTED Final   Pseudomonas aeruginosa NOT DETECTED NOT DETECTED Final   Candida albicans NOT DETECTED NOT DETECTED Final   Candida glabrata NOT DETECTED NOT DETECTED Final   Candida krusei NOT DETECTED NOT DETECTED Final  Candida parapsilosis NOT DETECTED NOT DETECTED Final    Candida tropicalis NOT DETECTED NOT DETECTED Final    Comment: Performed at [facility]  Culture, blood (Routine X 2) w Reflex to ID Panel     Status: None (Preliminary result)   Collection Time: 06/04/16  5:45 PM  Result Value Ref Range Status   Specimen Description BLOOD RIGHT ANTECUBITAL  Final   Special Requests BOTTLES DRAWN AEROBIC AND ANAEROBIC 5ML  Final   Culture   Final    NO GROWTH 4 DAYS Performed at [facility]    Report Status PENDING  Incomplete  MRSA PCR Screening     Status: None   Collection Time: 06/05/16  6:30 AM  Result Value Ref Range Status   MRSA by PCR NEGATIVE NEGATIVE Final    Comment:        The GeneXpert MRSA Assay (FDA approved for NASAL specimens only), is one component of a comprehensive MRSA colonization surveillance program. It is not intended to diagnose MRSA infection nor to guide or monitor treatment for MRSA infections.   Aerobic/Anaerobic Culture (surgical/deep wound)     Status: None (Preliminary result)   Collection Time: 06/06/16  7:29 PM  Result Value Ref Range Status   Specimen Description ABSCESS  Final   Special Requests NONE  Final   Gram Stain   Final    FEW WBC PRESENT,BOTH PMN AND MONONUCLEAR NO ORGANISMS SEEN Performed at [facility]    Culture   Final    RARE STAPHYLOCOCCUS AUREUS CRITICAL RESULT CALLED TO, READ BACK BY AND VERIFIED WITH: RN [facility]UNDLACH [id number] MLM NO ANAEROBES ISOLATED; CULTURE IN PROGRESS FOR 5 DAYS    Report Status PENDING  Incomplete   Organism ID, Bacteria STAPHYLOCOCCUS AUREUS  Final      Susceptibility   Staphylococcus aureus - MIC*    CIPROFLOXACIN <=0.5 SENSITIVE Sensitive     ERYTHROMYCIN <=0.25 SENSITIVE Sensitive     GENTAMICIN <=0.5 SENSITIVE Sensitive     OXACILLIN <=0.25 SENSITIVE Sensitive     TETRACYCLINE <=1 SENSITIVE Sensitive     VANCOMYCIN <=0.5 SENSITIVE Sensitive     TRIMETH/SULFA <=10 SENSITIVE Sensitive     CLINDAMYCIN <=0.25 SENSITIVE  Sensitive     RIFAMPIN <=0.5 SENSITIVE Sensitive     Inducible Clindamycin NEGATIVE Sensitive     * RARE STAPHYLOCOCCUS AUREUS  Culture, blood (routine x 2)     Status: None (Preliminary result)   Collection Time: 06/07/16  4:17 AM  Result Value Ref Range Status   Specimen Description BLOOD RIGHT ANTECUBITAL  Final   Special Requests IN PEDIATRIC BOTTLE 2ML  Final   Culture   Final    NO GROWTH 2 DAYS Performed at [facility]    Report Status PENDING  Incomplete  Culture, blood (routine x 2)     Status: None (Preliminary result)   Collection Time: 06/07/16  4:17 AM  Result Value Ref Range Status   Specimen Description BLOOD RIGHT HAND  Final   Special Requests IN PEDIATRIC BOTTLE 2ML  Final   Culture   Final    NO GROWTH 2 DAYS Performed at [facility]     Report Status PENDING  Incomplete     Radiology Studies: No results found.  Scheduled Meds: . arformoterol  15 mcg Nebulization BID  . aspirin  81 mg Oral Daily  . budesonide (PULMICORT) nebulizer solution  0.25 mg Nebulization BID  .  ceFAZolin (ANCEF) IV  2 g Intravenous  Q8H  . clonazePAM  2 mg Oral QHS  . dolutegravir  50 mg Oral Daily  . emtricitabine-tenofovir AF  1 tablet Oral Daily  . enoxaparin (LOVENOX) injection  40 mg Subcutaneous Q24H  . feeding supplement (ENSURE ENLIVE)  237 mL Oral BID BM  . guaiFENesin  600 mg Oral BID  . insulin aspart  0-5 Units Subcutaneous QHS  . insulin aspart  0-9 Units Subcutaneous TID WC  . metoprolol tartrate  50 mg Oral BID  . morphine  30 mg Oral Q12H  . multivitamin with minerals  1 tablet Oral Daily  . nicotine  21 mg Transdermal Daily  . nortriptyline  50 mg Oral QHS  . sodium chloride flush  3 mL Intravenous Q12H  . spironolactone  25 mg Oral Daily   Continuous Infusions: . sodium chloride 10 mL/hr at 06/07/16 0247     LOS: 5 days   [name], Scheryl MartenSTEPHEN K, MD [facility] Pager [phone]  If 7PM-7AM, please contact  night-coverage [email or web address] Password [email or web address] 06/09/2016, 6:31 PM

## 2016-06-09 NOTE — CV Procedure (Signed)
See full report in [employer]  During this procedure the patient is administered a total of Versed 6 mg and Fentanyl 75 mg and Benedryl 50 mg to achieve and maintain moderate conscious sedation.  The patient's heart rate, blood pressure, and oxygen saturation are monitored continuously during the procedure. The period of conscious sedation is 17 minutes, of which I was present face-to-face 100% of this time.  Normal EF 65% Severe prolapse P3 segment of posterior leaflet with moderate MR Normal AV, TV, PV Mild LAE No LAA thrombus NO SBE No Vegetations Mild aortic debris  Charlton HawsPeter Marsean Elkhatib

## 2016-06-09 NOTE — Progress Notes (Signed)
Patient ID: [name], male   DOB: 1960/09/04, 55 y.o.   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 arformoterol  15 mcg Nebulization BID  . aspirin  81 mg Oral Daily  . budesonide (PULMICORT) nebulizer solution  0.25 mg Nebulization BID  .  ceFAZolin (ANCEF) IV  2 g Intravenous Q8H  . clonazePAM  1 mg Oral QHS  . dolutegravir  50 mg Oral Daily  . emtricitabine-tenofovir AF  1 tablet Oral Daily  . enoxaparin (LOVENOX) injection  40 mg Subcutaneous Q24H  . feeding supplement (ENSURE ENLIVE)  237 mL Oral BID BM  . guaiFENesin  600 mg Oral BID  . insulin aspart  0-5 Units Subcutaneous QHS  . insulin aspart  0-9 Units Subcutaneous TID WC  . metoprolol tartrate  50 mg Oral BID  . morphine  30 mg Oral Q12H  . multivitamin with minerals  1 tablet Oral Daily  . nicotine  21 mg Transdermal Daily  . nortriptyline  50 mg Oral QHS  . sodium chloride flush  3 mL Intravenous Q12H  . spironolactone  25 mg Oral Daily   Continuous Infusions: . sodium chloride 10 mL/hr at 06/07/16 0247   PRN Meds: acetaminophen **OR** acetaminophen, albuterol, ALPRAZolam, benzonatate, HYDROcodone-acetaminophen, HYDROmorphone (DILAUDID) injection, methocarbamol **OR** methocarbamol (ROBAXIN)  IV, metoCLOPramide **OR** metoCLOPramide (REGLAN) injection, oxyCODONE   Vital Signs      Vitals:   06/08/16 1300 06/08/16 1951 06/08/16 2115 06/09/16 0549  BP: 108/62  110/70 109/77  Pulse: 78  76 69  Resp: 18  18 18   Temp: 98.4 F (36.9 C)  97.9 F (36.6 C) 98 F (36.7 C)  TempSrc: Oral  Oral Oral  SpO2: 97% 97% 95% 95%  Weight:      Height:        Intake/Output Summary (Last 24 hours) at 06/09/16 0727 Last data filed at 06/09/16 0600  Gross per 24 hour  Intake             1220 ml  Output                0 ml  Net             1220 ml   Filed Weights   06/04/16 1825 06/04/16 2100  Weight: 205 lb 3 oz (93.1 kg) 205 lb (93 kg)    Physical Exam    GEN: Well nourished, well developed, in no acute distress.  HEENT: Grossly normal.  Neck: Supple, no JVD, carotid bruits, or masses. Cardiac: RRR, loud MR  murmurs, rubs, or gallops. No clubbing, cyanosis, edema.  Radials/DP/PT 2+ and equal bilaterally.  Respiratory:  Respirations regular and unlabored, clear to auscultation bilaterally. GI: Soft, nontender, nondistended, BS + x 4. MS: no deformity or atrophy. Skin: warm and dry, no rash. Neuro:  Strength and sensation are intact. Psych: AAOx3.  Normal affect. 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 Hemoglobin A1C No results for input(s): HGBA1C in the last 72 hours. Thyroid Function Tests No results for input(s): TSH, T4TOTAL, T3FREE, THYROIDAB in the last 72 hours.  Invalid input(s): FREET3  Telemetry    NSR 06/09/2016  -  Personally Reviewed  ECG    NSR rate 91 PR 201 no acute changes   - Personally Reviewed  Radiology    No results found.  Cardiac Studies   Echo EF 50-55%   Patient Profile       Assessment & Plan    VT: Telemetry with short asymptomatic burst NSVT less than 10 beats He stopped beta blocker a year ago due to fatigue. D/c amiodarone drip now that surgery done D/c with lopressor 25 bid will arrange outpatient f/u with Dr Ladona Ridgelaylor  MR:  Murmur is very loud but echo with mild to moderate MR Given bacteremia will proceed withTEE today Per ID   Osteomyelitis:  post amputation plan per ortho BC;s 1/2 positive for staph Per Dr Luciana Axeomer if no TEE Done patient will need 6 weeks antibiotics. [facility]    Signed, Charlton HawsPeter Kjell Brannen, MD  06/09/2016, 7:27 AM

## 2016-06-09 NOTE — Progress Notes (Signed)
Nutrition Follow-up  DOCUMENTATION CODES:   Non-severe (moderate) malnutrition in context of acute illness/injury  INTERVENTION:  Continue Ensure Enlive po BID, each supplement provides 350 kcal and 20 grams of protein.  Continue multivitamin with minerals daily.  Clarified with patient to eat three meals daily and then drink the Ensure Enlive between meals (not as a meal replacement) as he has increased needs for calories and protein. Patient is amenable to doing this and reports he will order Ensure online when he gets home.  NUTRITION DIAGNOSIS:   Increased nutrient needs related to wound healing as evidenced by estimated needs.  Ongoing.  GOAL:   Patient will meet greater than or equal to 90% of their needs  Progressing.  MONITOR:   PO intake, Supplement acceptance, Labs, Weight trends, I & O's, Skin  REASON FOR ASSESSMENT:   Malnutrition Screening Tool, Consult Wound healing  ASSESSMENT:   55 y.o. malewith medical history significant of AIDS, DM2 ,COPD, [id number] syndrome with mild MR, sustained V. tach with normal cardiac catheterizationbeing admitted for foot ulcer complicatedby osteomyelitis, palpitations and history of COPD.  -Patient s/p R fifth ray amputation on 12/4. -[facility]. Findings negative for endocarditis. Plan is for 2 weeks outpatient Abx via PICC.  Spoke with patient at bedside. He reports his appetite is much better than on admission. He was NPO most of today but had ordered chicken salad with vegetables, pita bread, and cottage cheese for a late lunch. He reports he enjoys the Ensure and will continue drinking those. Encouraged patient to have all three meals and then drink the Ensure between meals to ensure he is receiving adequate calories and protein.  Meal Completion: 100% per patient report. Patient did miss lunch yesterday and had an Ensure instead. In the past 24 hours patient has had 1406 kcal (64%  minimum estimated kcal needs) and 64 grams protein (58% minimum estimated protein needs).   Medications reviewed and include: Novolog sliding scale TID with meals and daily HS, multivitamin with minerals daily.  Labs reviewed: CBG 89-119 past 24 hrs. No other pertinent labs on Chem Profile yesterday, no Chem Profile today.  Diet Order:  Diet Carb Modified Fluid consistency: Thin; Room service appropriate? Yes  Skin:  Wound (see comment) (Stg III to right foot)  Last BM:  06/07/2016  Height:   Ht Readings from Last 1 Encounters:  06/04/16 6\' 2"  (1.88 m)    Weight:   Wt Readings from Last 1 Encounters:  06/04/16 205 lb (93 kg)    Ideal Body Weight:  86.4 kg  BMI:  Body mass index is 26.32 kg/m.  Estimated Nutritional Needs:   Kcal:  2200-2500 (MSJ x 1.2-1.4)  Protein:  110-130 grams (1.2-1.4 grams/kg)  Fluid:  >/= 2.5 L/day  EDUCATION NEEDS:   Education needs addressed  Helane RimaLeanne Mimi Debellis, MS, RD, LDN Pager: [phone] After Hours Pager: [phone]

## 2016-06-10 ENCOUNTER — Encounter (HOSPITAL_COMMUNITY): Payer: Self-pay | Admitting: Cardiovascular Disease

## 2016-06-10 LAB — GLUCOSE, CAPILLARY
GLUCOSE-CAPILLARY: 84 mg/dL (ref 65–99)
Glucose-Capillary: 93 mg/dL (ref 65–99)
Glucose-Capillary: 101 mg/dL — ABNORMAL HIGH (ref 65–99)
Glucose-Capillary: 87 mg/dL (ref 65–99)

## 2016-06-10 LAB — CULTURE, BLOOD (ROUTINE X 2): CULTURE: NO GROWTH

## 2016-06-10 MED ORDER — HEPARIN SOD (PORK) LOCK FLUSH 100 UNIT/ML IV SOLN
250.0000 [IU] | INTRAVENOUS | Status: DC | PRN
  Administered 2016-06-10: 250 [IU]
  Filled 2016-06-10 (×2): qty 3

## 2016-06-10 MED ORDER — NORTRIPTYLINE HCL 50 MG PO CAPS: 50.0000 mg | ORAL_CAPSULE | Freq: Every day | ORAL | 0 refills | Status: DC

## 2016-06-10 MED ORDER — SODIUM CHLORIDE 0.9% FLUSH: 10.0000 mL | INTRAVENOUS | Status: DC | PRN

## 2016-06-10 MED ORDER — HEPARIN SOD (PORK) LOCK FLUSH 100 UNIT/ML IV SOLN
250.0000 [IU] | Freq: Every day | INTRAVENOUS | Status: DC
  Filled 2016-06-10: qty 3

## 2016-06-10 MED ORDER — CEFAZOLIN SODIUM-DEXTROSE 2-4 GM/100ML-% IV SOLN: 2.0000 g | Freq: Three times a day (TID) | INTRAVENOUS | Status: DC

## 2016-06-10 MED ORDER — METOPROLOL TARTRATE 50 MG PO TABS: 50.0000 mg | ORAL_TABLET | Freq: Two times a day (BID) | ORAL | 0 refills | Status: DC

## 2016-06-10 NOTE — Progress Notes (Signed)
RN gave patient discharge papers and went through them with the patient. Also, patient was given his nighttime medicines and given his 2 prescriptions to pick up. RN and tech wheeled patient downstairs to the ED with all of his belongings and niece picked him up. Belongings included the box of antibiotics for the home health nurse to start in the AM.

## 2016-06-10 NOTE — Progress Notes (Signed)
Paged Dr. Luciana Axeomer with infectious disease about refill on MS contin. Pt states he sees him in the office for refills sometimes.

## 2016-06-10 NOTE — Progress Notes (Signed)
Peripherally Inserted Central Catheter/Midline Placement  The IV Nurse has discussed with the patient and/or persons authorized to consent for the patient, the purpose of this procedure and the potential benefits and risks involved with this procedure.  The benefits include less needle sticks, lab draws from the catheter, and the patient may be discharged home with the catheter. Risks include, but not limited to, infection, bleeding, blood clot (thrombus formation), and puncture of an artery; nerve damage and irregular heartbeat and possibility to perform a PICC exchange if needed/ordered by physician.  Alternatives to this procedure were also discussed.  Bard Power PICC patient education guide, fact sheet on infection prevention and patient information card has been provided to patient /or left at bedside.    PICC/Midline Placement Documentation        [name], [name] 06/10/2016, 4:12 PM

## 2016-06-10 NOTE — Clinical Social Work Psych Note (Signed)
Clinical Social Worker Psych Service Line Progress Note  Clinical Social Worker: Lia Hopping, LCSW Date/Time: 06/09/2016, 1:00 PM   Review of Patient  Overall Medical Condition:  Not yet stable. But feels his foot surgery went well.   Participation Level:  Active Participation Quality: Appropriate Other Participation Quality: Knowledgeable of medication intake and reactions to medication.    Affect:   Cognitive: Alert, Oriented Reaction to Medications/Concerns:  Patient is concerned that certain psych medications are causing cardiac toxicity. Patient is willing to take small dose of nortriptyline.   Modes of Intervention: Solution-focused   Summary of Progress/Plan at Discharge  Summary of Progress/Plan at Discharge: LCSWA and psychiarist met with patient sitting in chair. Patient alert and oriented x4. Patient denies SI/HI Patient reports he has suffered from depression and PTSD, neuropathy. The patient reports he has been taking several medications for depression and PTSD and has an outpatient psychiatrist that he has been seeing for years in the community. The patient requested and agreeable to medication management provided by the psychiatrist and physician.  Patient reports no concerns or needs for LCSWA at this time.

## 2016-06-10 NOTE — Discharge Summary (Signed)
Physician Discharge Summary  [name] [name]:[id number] DOB: 07-12-1960 DOA: 06/04/2016  PCP: Johnathan Sax, MD  Admit date: 06/04/2016 Discharge date: 06/10/2016  Admitted From: Home Disposition:  Home  Recommendations for Outpatient Follow-up:  1. Follow up with PCP in 1-2 weeks 2. Follow up with Cardiology as scheduled 3. Follow up with Orthopedic Surgery as scheduled 4. Please remove PICC after completing 06/20/16 dose of IV antibiotic  Home Health:[facility]     Discharge Condition:Stable CODE STATUS:Full Diet recommendation:  Diabetic  Brief/Interim Summary: 55 y.o. 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 tach with normal cardiac catheterizationbeing admitted for foot ulcer complicatedby osteomyelitis, palpitations and history of COPD  #1 right foot ulcer/chronic osteomyelitis of the right foot/cellulitis - Patient noted to have osteomyelitis of R foot on 11/17 MRI. 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 tach - Noted to have episodes of nonsustained vtach. - Cardiology consulted with recommendations for d/c on lopressor 25mg  bid and f/u with [name] - Patient remains stable  #3 MSSAbacteremia - 2d echo neg for vegetations - Repeat blood cx negative - Patient continues on ancef  - TEE neg for vegetations  #4 Hyperlipidemia versus diabetes. - Last hemoglobin A1c was 5.5 on 03/25/2009.  - glucose trends remain stable  #5COPD/ILD - no wheezing on exam today - remains stable on minimal O2 support  #6HIV - Most recent viral load of 30 - Will have patient follow up with  ID on discharge  #7depression - Remains stable currently - Pamelor had initially been on hold secondary to drug interaction with amiodarone.  - Pamelor dose decreased  - Consulted Psychiatry for medication dose adjustment. Appreciate input. 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   aspirin 81 MG chewable tablet Chew 81 mg by mouth daily.   ceFAZolin 2-4 GM/100ML-% IVPB Commonly known as:  ANCEF Inject 100 mLs (2 g total) into the vein every 8 (eight) hours.   clonazePAM 1 MG tablet Commonly known as:  KLONOPIN Take 1 mg by mouth at bedtime.   DESCOVY 200-25 MG tablet Generic drug:  emtricitabine-tenofovir AF TAKE 1 TABLET BY MOUTH DAILY.   furosemide 40 MG tablet Commonly known as:  LASIX TAKE 1/2 TABLET (20 MG TOTAL) BY BY MOUTH EVERY MORNING.   metoprolol 50 MG tablet Commonly known as:  LOPRESSOR Take 1 tablet (50 mg total) by mouth 2 (two) times daily.   Morphine Sulfate 40 MG Cp24 Take 40 mg  by mouth 2 (two) times daily.   multivitamin with minerals tablet Take 1 tablet by mouth daily.   nortriptyline 50 MG capsule Commonly known as:  PAMELOR Take 1 capsule (50 mg total) by mouth at bedtime. What changed:  medication strength  how much to take   Oil of Oregano 1500 MG Caps Take 1 capsule by mouth 3 (three) times a week.   promethazine 25 MG tablet Commonly known as:  PHENERGAN TAKE 1 TABLET BY MOUTH EVERY 8 HOURS AS NEEDED.   silver sulfADIAZINE 1 % cream Commonly known as:  SILVADENE Apply 1 application topically daily.   spironolactone 25 MG tablet Commonly known as:  ALDACTONE TAKE 1 TABLET BY MOUTH ONCE DAILY.   sulfamethoxazole-trimethoprim 800-160 MG tablet Commonly known as:  BACTRIM DS,SEPTRA DS TAKE 1 TABLET BY MOUTH 3 TIMES A WEEK.   TIVICAY 50 MG tablet Generic drug:  dolutegravir TAKE 1 TABLET BY MOUTH DAILY.            Durable Medical Equipment        Start     Ordered   06/07/16 1338  For home use only DME Other see comment  Once    Comments:  MD ordering a Knee scooter   06/07/16 1338     Follow-up Information    Johnathan Mustard, MD Follow up in 1 week(s).   Specialty:  Orthopedic Surgery Contact information: [address] [place] [address] [phone]        [name], [place]. Go on 06/30/2016.   Specialty:  Cardiology Why:  @9am  for post hospital for VT ([name] is in clinic) Contact information: [address] [place] [address] [phone]        Johnathan Haws, MD. Go on 09/05/2016.   Specialty:  Cardiology Why:  @9am  for mitral valve prolapse Contact information: [address] [place] [address] [phone]          Allergies  Allergen Reactions  . Tetracycline Hcl Swelling    Lips, facial swelling    Consultations:  Cardiology  ID  Psychiatry  Procedures/Studies: Dg Chest 2 View  Result Date: 06/04/2016 CLINICAL DATA:   55 year old male with history of cough and fever for the past 4 days. Possible sepsis. EXAM: CHEST  2 VIEW COMPARISON:  Chest x-ray 06/09/2015. FINDINGS: Emphysematous changes are again noted throughout the lungs bilaterally, most notable in the lung apices. No confluent consolidative airspace disease. Slight coarsening of interstitial markings throughout the mid to lower lungs bilaterally is chronic and similar to the prior examination. Bibasilar linear opacities favored to reflect areas of chronic scarring and/or subsegmental atelectasis. No pleural effusions. No evidence of pulmonary edema. Heart size is normal. The patient is rotated to the right on today's exam, resulting in distortion of the mediastinal contours and reduced diagnostic sensitivity and specificity for mediastinal pathology. IMPRESSION: 1. Chronic changes in the lungs suggestive of underlying COPD redemonstrated, as above. No definite radiographic evidence of acute cardiopulmonary disease.  Electronically Signed   By: Trudie Reed M.D.   On: 06/04/2016 16:41   Mr Foot Right W Wo Contrast  Result Date: 05/23/2016 CLINICAL DATA:  Right foot ulcer will not heal for 3 months. EXAM: MRI OF THE RIGHT FOREFOOT WITHOUT AND WITH CONTRAST TECHNIQUE: Multiplanar, multisequence MR imaging was performed both before and after administration of intravenous contrast. CONTRAST:  20mL MULTIHANCE GADOBENATE DIMEGLUMINE 529 MG/ML IV SOLN COMPARISON:  None. FINDINGS: Bones/Joint/Cartilage Soft tissue ulcer overlying the fifth MTP joint along the plantar aspect. Underlying cortical erosion of the plantar aspect of the fifth metatarsal head with surrounding marrow edema and mild enhancement most consistent with osteomyelitis. No other focal marrow signal abnormality. No other areas of bone destruction or periosteal reaction. No fracture or dislocation. Normal alignment. No joint effusion. Ligaments Collateral ligaments are intact. Muscles and Tendons Flexor,  peroneal and extensor compartment tendons are intact. Soft tissue No fluid collection or hematoma.  No soft tissue mass. IMPRESSION: 1. Soft tissue ulcer overlying the plantar aspect of the fifth MTP joint. Mild osteomyelitis of the plantar aspect of the fifth metatarsal head. No focal fluid collection to suggest an abscess. Electronically Signed   By: Elige Ko   On: 05/23/2016 08:06   Dg Foot Complete Right  Result Date: 06/05/2016 CLINICAL DATA:  Continued pain and foul odor from a nonhealing wound in the lateral aspect of the right foot at the fifth MTP joint. EXAM: RIGHT FOOT COMPLETE - 3+ VIEW COMPARISON:  05/06/2016. FINDINGS: Mildly progressive bone destruction involving the fifth metatarsal head. There is also dorsal dislocation of the fifth toe at the level of the MTP joint. Associated soft tissue swelling. No soft tissue gas. Stable bone island in the second metatarsal. IMPRESSION: 1. Mildly progressive changes of osteomyelitis involving the fifth metatarsal head. 2. Dorsal dislocation of the fifth toe at the MTP joint. Electronically Signed   By: Beckie Salts M.D.   On: 06/05/2016 11:16    Subjective: Eager to go home  Discharge Exam: Vitals:   06/09/16 2131 06/10/16 0921  BP: 111/71 109/84  Pulse: 75 75  Resp:    Temp:     Vitals:   06/09/16 1928 06/09/16 2037 06/09/16 2131 06/10/16 0921  BP:  101/62 111/71 109/84  Pulse:  71 75 75  Resp:  16    Temp:  97.9 F (36.6 C)    TempSrc:  Oral    SpO2: 99% 98%    Weight:      Height:        General: Pt is alert, awake, not in acute distress Cardiovascular: RRR, S1/S2 +, no rubs, no gallops Respiratory: CTA bilaterally, no wheezing, no rhonchi Abdominal: Soft, NT, ND, bowel sounds + Extremities: no edema, no cyanosis   The results of significant diagnostics from this hospitalization (including imaging, microbiology, ancillary and laboratory) are listed below for reference.     Microbiology: Recent Results (from the  past 240 hour(s))  Culture, blood (Routine X 2) w Reflex to ID Panel     Status: Abnormal   Collection Time: 06/04/16  4:29 PM  Result Value Ref Range Status   Specimen Description BLOOD BLOOD RIGHT FOREARM  Final   Special Requests BOTTLES DRAWN AEROBIC AND ANAEROBIC EA  Final   Culture  Setup Time   Final    GRAM POSITIVE COCCI IN CLUSTERS IN BOTH AEROBIC AND ANAEROBIC BOTTLES CRITICAL RESULT CALLED TO, READ BACK BY AND VERIFIED WITH: E JACKSON,PHARMD AT 2050 06/05/16 BY V  WILKINS Performed at [facility]    Culture STAPHYLOCOCCUS AUREUS (A)  Final   Report Status 06/07/2016 FINAL  Final   Organism ID, Bacteria STAPHYLOCOCCUS AUREUS  Final      Susceptibility   Staphylococcus aureus - MIC*    CIPROFLOXACIN <=0.5 SENSITIVE Sensitive     ERYTHROMYCIN <=0.25 SENSITIVE Sensitive     GENTAMICIN <=0.5 SENSITIVE Sensitive     OXACILLIN <=0.25 SENSITIVE Sensitive     TETRACYCLINE <=1 SENSITIVE Sensitive     VANCOMYCIN <=0.5 SENSITIVE Sensitive     TRIMETH/SULFA <=10 SENSITIVE Sensitive     CLINDAMYCIN <=0.25 SENSITIVE Sensitive     RIFAMPIN <=0.5 SENSITIVE Sensitive     Inducible Clindamycin NEGATIVE Sensitive     * STAPHYLOCOCCUS AUREUS  Blood Culture ID Panel (Reflexed)     Status: Abnormal   Collection Time: 06/04/16  4:29 PM  Result Value Ref Range Status   Enterococcus species NOT DETECTED NOT DETECTED Final   Listeria monocytogenes NOT DETECTED NOT DETECTED Final   Staphylococcus species DETECTED (A) NOT DETECTED Final    Comment: CRITICAL RESULT CALLED TO, READ BACK BY AND VERIFIED WITH: E.JACKSON,PHARMD 06/05/16 @2050  BY V.WILKINS    Staphylococcus aureus DETECTED (A) NOT DETECTED Final    Comment: CRITICAL RESULT CALLED TO, READ BACK BY AND VERIFIED WITH: E.JACKSON,PHARMD 06/05/16 @2050  BY V.WILKINS    Methicillin resistance NOT DETECTED NOT DETECTED Final   Streptococcus species NOT DETECTED NOT DETECTED Final   Streptococcus agalactiae NOT DETECTED NOT  DETECTED Final   Streptococcus pneumoniae NOT DETECTED NOT DETECTED Final   Streptococcus pyogenes NOT DETECTED NOT DETECTED Final   Acinetobacter baumannii NOT DETECTED NOT DETECTED Final   Enterobacteriaceae species NOT DETECTED NOT DETECTED Final   Enterobacter cloacae complex NOT DETECTED NOT DETECTED Final   Escherichia coli NOT DETECTED NOT DETECTED Final   Klebsiella oxytoca NOT DETECTED NOT DETECTED Final   Klebsiella pneumoniae NOT DETECTED NOT DETECTED Final   Proteus species NOT DETECTED NOT DETECTED Final   Serratia marcescens NOT DETECTED NOT DETECTED Final   Haemophilus influenzae NOT DETECTED NOT DETECTED Final   Neisseria meningitidis NOT DETECTED NOT DETECTED Final   Pseudomonas aeruginosa NOT DETECTED NOT DETECTED Final   Candida albicans NOT DETECTED NOT DETECTED Final   Candida glabrata NOT DETECTED NOT DETECTED Final   Candida krusei NOT DETECTED NOT DETECTED Final   Candida parapsilosis NOT DETECTED NOT DETECTED Final   Candida tropicalis NOT DETECTED NOT DETECTED Final    Comment: Performed at [facility]  Culture, blood (Routine X 2) w Reflex to ID Panel     Status: None   Collection Time: 06/04/16  5:45 PM  Result Value Ref Range Status   Specimen Description BLOOD RIGHT ANTECUBITAL  Final   Special Requests BOTTLES DRAWN AEROBIC AND ANAEROBIC  Final   Culture   Final    NO GROWTH 5 DAYS Performed at [facility]    Report Status 06/10/2016 FINAL  Final  MRSA PCR Screening     Status: None   Collection Time: 06/05/16  6:30 AM  Result Value Ref Range Status   MRSA by PCR NEGATIVE NEGATIVE Final    Comment:        The GeneXpert MRSA Assay (FDA approved for NASAL specimens only), is one component of a comprehensive MRSA colonization surveillance program. It is not intended to diagnose MRSA infection nor to guide or monitor treatment for MRSA infections.   Aerobic/Anaerobic Culture (surgical/deep wound)  Status: None (Preliminary  result)   Collection Time: 06/06/16  7:29 PM  Result Value Ref Range Status   Specimen Description ABSCESS  Final   Special Requests NONE  Final   Gram Stain   Final    FEW WBC PRESENT,BOTH PMN AND MONONUCLEAR NO ORGANISMS SEEN Performed at [facility]    Culture   Final    RARE STAPHYLOCOCCUS AUREUS CRITICAL RESULT CALLED TO, READ BACK BY AND VERIFIED WITH: RN [facility] [id number] 1126AM MLM NO ANAEROBES ISOLATED; CULTURE IN PROGRESS FOR 5 DAYS    Report Status PENDING  Incomplete   Organism ID, Bacteria STAPHYLOCOCCUS AUREUS  Final      Susceptibility   Staphylococcus aureus - MIC*    CIPROFLOXACIN <=0.5 SENSITIVE Sensitive     ERYTHROMYCIN <=0.25 SENSITIVE Sensitive     GENTAMICIN <=0.5 SENSITIVE Sensitive     OXACILLIN <=0.25 SENSITIVE Sensitive     TETRACYCLINE <=1 SENSITIVE Sensitive     VANCOMYCIN <=0.5 SENSITIVE Sensitive     TRIMETH/SULFA <=10 SENSITIVE Sensitive     CLINDAMYCIN <=0.25 SENSITIVE Sensitive     RIFAMPIN <=0.5 SENSITIVE Sensitive     Inducible Clindamycin NEGATIVE Sensitive     * RARE STAPHYLOCOCCUS AUREUS  Culture, blood (routine x 2)     Status: None (Preliminary result)   Collection Time: 06/07/16  4:17 AM  Result Value Ref Range Status   Specimen Description BLOOD RIGHT ANTECUBITAL  Final   Special Requests IN PEDIATRIC BOTTLE  Final   Culture   Final    NO GROWTH 3 DAYS Performed at [facility]    Report Status PENDING  Incomplete  Culture, blood (routine x 2)     Status: None (Preliminary result)   Collection Time: 06/07/16  4:17 AM  Result Value Ref Range Status   Specimen Description BLOOD RIGHT HAND  Final   Special Requests IN PEDIATRIC BOTTLE  Final   Culture   Final    NO GROWTH 3 DAYS Performed at [facility]    Report Status PENDING  Incomplete     Labs: BNP (last 3 results) No results for input(s): BNP in the last 8760 hours. Basic Metabolic Panel:  Recent Labs Lab 06/04/16 1640  06/05/16 0551 06/06/16 0520 06/07/16 0417 06/08/16 0341  NA 132* 133* 135 136 135  K 3.7 3.5 3.5 4.2 3.9  CL 99* 101 100* 105 102  CO2 22 25 27 23 25   GLUCOSE 124* 87 108* 84 94  BUN 16 15 16 12 12   CREATININE 1.00 0.90 0.81 0.74 0.77  CALCIUM 9.4 8.7* 8.7* 9.1 9.3  MG  --  1.7 2.2 2.0  --   PHOS  --  3.3  --   --   --    Liver Function Tests:  Recent Labs Lab 06/04/16 1640 06/05/16 0551  AST 17 11*  ALT 12* 10*  ALKPHOS 94 74  BILITOT 1.3* 1.0  PROT 8.4* 6.9  ALBUMIN 3.7 3.0*   No results for input(s): LIPASE, AMYLASE in the last 168 hours. No results for input(s): AMMONIA in the last 168 hours. CBC:  Recent Labs Lab 06/04/16 1640 06/05/16 0551 06/06/16 0520 06/07/16 0417 06/08/16 0341  WBC 8.3 4.0 2.9* 4.9 6.3  NEUTROABS 7.1  --   --  3.6  --   HGB 12.2* 10.6* 10.4* 10.9* 10.6*  HCT 36.9* 32.8* 32.5* 33.5* 33.0*  MCV 86.4 87.9 88.3 87.2 87.1  PLT 202 152 161 199 225  Cardiac Enzymes: No results for input(s): CKTOTAL, CKMB, CKMBINDEX, TROPONINI in the last 168 hours. BNP: Invalid input(s): POCBNP CBG:  Recent Labs Lab 06/09/16 1153 06/09/16 1725 06/09/16 2032 06/10/16 0818 06/10/16 1150  GLUCAP 114* 93 110* 93 101*   D-Dimer No results for input(s): DDIMER in the last 72 hours. Hgb A1c No results for input(s): HGBA1C in the last 72 hours. Lipid Profile No results for input(s): CHOL, HDL, LDLCALC, TRIG, CHOLHDL, LDLDIRECT in the last 72 hours. Thyroid function studies No results for input(s): TSH, T4TOTAL, T3FREE, THYROIDAB in the last 72 hours.  Invalid input(s): FREET3 Anemia work up No results for input(s): VITAMINB12, FOLATE, FERRITIN, TIBC, IRON, RETICCTPCT in the last 72 hours. Urinalysis    Component Value Date/Time   COLORURINE AMBER (A) 06/04/2016 1655   APPEARANCEUR CLEAR 06/04/2016 1655   LABSPEC 1.025 06/04/2016 1655   PHURINE 6.0 06/04/2016 1655   GLUCOSEU NEGATIVE 06/04/2016 1655   HGBUR MODERATE (A) 06/04/2016 1655    BILIRUBINUR MODERATE (A) 06/04/2016 1655   KETONESUR NEGATIVE 06/04/2016 1655   PROTEINUR 30 (A) 06/04/2016 1655   UROBILINOGEN >8.0 (H) 01/23/2011 2032   NITRITE NEGATIVE 06/04/2016 1655   LEUKOCYTESUR NEGATIVE 06/04/2016 1655   Sepsis Labs Invalid input(s): PROCALCITONIN,  WBC,  LACTICIDVEN Microbiology Recent Results (from the past 240 hour(s))  Culture, blood (Routine X 2) w Reflex to ID Panel     Status: Abnormal   Collection Time: 06/04/16  4:29 PM  Result Value Ref Range Status   Specimen Description BLOOD BLOOD RIGHT FOREARM  Final   Special Requests BOTTLES DRAWN AEROBIC AND ANAEROBIC 5ML EA  Final   Culture  Setup Time   Final    GRAM POSITIVE COCCI IN CLUSTERS IN BOTH AEROBIC AND ANAEROBIC BOTTLES CRITICAL RESULT CALLED TO, READ BACK BY AND VERIFIED WITH: E JACKSON,PHARMD AT 2050 06/05/16 BY V WILKINS Performed [facility]    Culture STAPHYLOCOCCUS AUREUS (A)  Final   Report Status 06/07/2016 FINAL  Final   Organism ID, Bacteria STAPHYLOCOCCUS AUREUS  Final      Susceptibility   Staphylococcus aureus - MIC*    CIPROFLOXACIN <=0.5 SENSITIVE Sensitive     ERYTHROMYCIN <=0.25 SENSITIVE Sensitive     GENTAMICIN <=0.5 SENSITIVE Sensitive     OXACILLIN <=0.25 SENSITIVE Sensitive     TETRACYCLINE <=1 SENSITIVE Sensitive     VANCOMYCIN <=0.5 SENSITIVE Sensitive     TRIMETH/SULFA <=10 SENSITIVE Sensitive     CLINDAMYCIN <=0.25 SENSITIVE Sensitive     RIFAMPIN <=0.5 SENSITIVE Sensitive     Inducible Clindamycin NEGATIVE Sensitive     * STAPHYLOCOCCUS AUREUS  Blood Culture ID Panel (Reflexed)     Status: Abnormal   Collection Time: 06/04/16  4:29 PM  Result Value Ref Range Status   Enterococcus species NOT DETECTED NOT DETECTED Final   Listeria monocytogenes NOT DETECTED NOT DETECTED Final   Staphylococcus species DETECTED (A) NOT DETECTED Final    Comment: CRITICAL RESULT CALLED TO, READ BACK BY AND VERIFIED WITH: E.JACKSON,PHARMD 06/05/16 @2050  BY V.WILKINS     Staphylococcus aureus DETECTED (A) NOT DETECTED Final    Comment: CRITICAL RESULT CALLED TO, READ BACK BY AND VERIFIED WITH: E.JACKSON,PHARMD 06/05/16 @2050  BY V.WILKINS    Methicillin resistance NOT DETECTED NOT DETECTED Final   Streptococcus species NOT DETECTED NOT DETECTED Final   Streptococcus agalactiae NOT DETECTED NOT DETECTED Final   Streptococcus pneumoniae NOT DETECTED NOT DETECTED Final   Streptococcus pyogenes NOT DETECTED NOT DETECTED Final   Acinetobacter  baumannii NOT DETECTED NOT DETECTED Final   Enterobacteriaceae species NOT DETECTED NOT DETECTED Final   Enterobacter cloacae complex NOT DETECTED NOT DETECTED Final   Escherichia coli NOT DETECTED NOT DETECTED Final   Klebsiella oxytoca NOT DETECTED NOT DETECTED Final   Klebsiella pneumoniae NOT DETECTED NOT DETECTED Final   Proteus species NOT DETECTED NOT DETECTED Final   Serratia marcescens NOT DETECTED NOT DETECTED Final   Haemophilus influenzae NOT DETECTED NOT DETECTED Final   Neisseria meningitidis NOT DETECTED NOT DETECTED Final   Pseudomonas aeruginosa NOT DETECTED NOT DETECTED Final   Candida albicans NOT DETECTED NOT DETECTED Final   Candida glabrata NOT DETECTED NOT DETECTED Final   Candida krusei NOT DETECTED NOT DETECTED Final   Candida parapsilosis NOT DETECTED NOT DETECTED Final   Candida tropicalis NOT DETECTED NOT DETECTED Final    Comment: Performed at [facility]  Culture, blood (Routine X 2) w Reflex to ID Panel     Status: None   Collection Time: 06/04/16  5:45 PM  Result Value Ref Range Status   Specimen Description BLOOD RIGHT ANTECUBITAL  Final   Special Requests BOTTLES DRAWN AEROBIC AND ANAEROBIC 5ML  Final   Culture   Final    NO GROWTH 5 DAYS [facility]    Report Status 06/10/2016 FINAL  Final  MRSA PCR Screening     Status: None   Collection Time: 06/05/16  6:30 AM  Result Value Ref Range Status   MRSA by PCR NEGATIVE NEGATIVE Final    Comment:         The GeneXpert MRSA Assay (FDA approved for NASAL specimens only), is one component of a comprehensive MRSA colonization surveillance program. It is not intended to diagnose MRSA infection nor to guide or monitor treatment for MRSA infections.   Aerobic/Anaerobic Culture (surgical/deep wound)     Status: None (Preliminary result)   Collection Time: 06/06/16  7:29 PM  Result Value Ref Range Status   Specimen Description ABSCESS  Final   Special Requests NONE  Final   Gram Stain   Final    FEW WBC PRESENT,BOTH PMN AND MONONUCLEAR NO ORGANISMS SEEN [facility]    Culture   Final    RARE STAPHYLOCOCCUS AUREUS CRITICAL RESULT CALLED TO, READ BACK BY AND VERIFIED WITH: RN [facility] [id number] 1126AM MLM NO ANAEROBES ISOLATED; CULTURE IN PROGRESS FOR 5 DAYS    Report Status PENDING  Incomplete   Organism ID, Bacteria STAPHYLOCOCCUS AUREUS  Final      Susceptibility   Staphylococcus aureus - MIC*    CIPROFLOXACIN <=0.5 SENSITIVE Sensitive     ERYTHROMYCIN <=0.25 SENSITIVE Sensitive     GENTAMICIN <=0.5 SENSITIVE Sensitive     OXACILLIN <=0.25 SENSITIVE Sensitive     TETRACYCLINE <=1 SENSITIVE Sensitive     VANCOMYCIN <=0.5 SENSITIVE Sensitive     TRIMETH/SULFA <=10 SENSITIVE Sensitive     CLINDAMYCIN <=0.25 SENSITIVE Sensitive     RIFAMPIN <=0.5 SENSITIVE Sensitive     Inducible Clindamycin NEGATIVE Sensitive     * RARE STAPHYLOCOCCUS AUREUS  Culture, blood (routine x 2)     Status: None (Preliminary result)   Collection Time: 06/07/16  4:17 AM  Result Value Ref Range Status   Specimen Description BLOOD RIGHT ANTECUBITAL  Final   Special Requests IN PEDIATRIC BOTTLE 2ML  Final   Culture   Final    NO GROWTH 3 DAYS Performed [facility]    Report Status  PENDING  Incomplete  Culture, blood (routine x 2)     Status: None (Preliminary result)   Collection Time: 06/07/16  4:17 AM  Result Value Ref Range Status   Specimen Description BLOOD RIGHT  HAND  Final   Special Requests IN PEDIATRIC BOTTLE  Final   Culture   Final    NO GROWTH 3 DAYS Performed at [facility]    Report Status PENDING  Incomplete     SIGNED:   Jerald Kief, MD  [facility] 06/10/2016, 2:09 PM  If 7PM-7AM, please contact night-coverage [email or web address] Password [email or web address]

## 2016-06-10 NOTE — Progress Notes (Signed)
Upon entering patients room, he was tearful, explaining he couldn't sleep x2 nights. He expressed that he wanted the doctor's to adjust his medications back to how they were, because it is interfering with his sleep and "his body can't heal without sleep." I explained to him that I would document this information and pass it on to the following nurse.

## 2016-06-10 NOTE — Progress Notes (Signed)
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 No chest pain or sob.   Inpatient Medications    Scheduled Meds: . arformoterol  15 mcg Nebulization BID  . aspirin  81 mg Oral Daily  . budesonide (PULMICORT) nebulizer solution  0.25 mg Nebulization BID  .  ceFAZolin (ANCEF) IV  2 g Intravenous Q8H  . clonazePAM  2 mg Oral QHS  . dolutegravir  50 mg Oral Daily  . emtricitabine-tenofovir AF  1 tablet Oral Daily  . enoxaparin (LOVENOX) injection  40 mg Subcutaneous Q24H  . feeding supplement (ENSURE ENLIVE)  237 mL Oral BID BM  . guaiFENesin  600 mg Oral BID  . insulin aspart  0-5 Units Subcutaneous QHS  . insulin aspart  0-9 Units Subcutaneous TID WC  . metoprolol tartrate  50 mg Oral BID  . morphine  30 mg Oral Q12H  . multivitamin with minerals  1 tablet Oral Daily  . nicotine  21 mg Transdermal Daily  . nortriptyline  50 mg Oral QHS  . sodium chloride flush  3 mL Intravenous Q12H  . spironolactone  25 mg Oral Daily   Continuous Infusions: . sodium chloride 10 mL/hr at 06/07/16 0247   PRN Meds: acetaminophen **OR** acetaminophen, albuterol, ALPRAZolam, benzonatate, HYDROcodone-acetaminophen, HYDROmorphone (DILAUDID) injection, methocarbamol **OR** methocarbamol (ROBAXIN)  IV, metoCLOPramide **OR** metoCLOPramide (REGLAN) injection, oxyCODONE   Vital Signs    Vitals:   06/09/16 1928 06/09/16 2037 06/09/16  2131 06/10/16 0921  BP:  101/62 111/71 109/84  Pulse:  71 75 75  Resp:  16    Temp:  97.9 F (36.6 C)    TempSrc:  Oral    SpO2: 99% 98%    Weight:      Height:        Intake/Output Summary (Last 24 hours) at 06/10/16 0944 Last data filed at 06/10/16 0600  Gross per 24 hour  Intake             1080 ml  Output                0 ml  Net             1080 ml   Filed Weights   06/04/16 1825 06/04/16 2100  Weight: 205 lb 3 oz (93.1 kg) 205 lb (93 kg)    Physical Exam   GEN: Well nourished, well developed, in no acute distress.  HEENT: Grossly normal.  Neck: Supple, no JVD, carotid bruits, or masses. Cardiac: RRR, loud systolic murmurs, rubs, or gallops. No clubbing, cyanosis, edema.  Right leg ace wrapped.Radials/DP/PT 2+ and equal bilaterally.  Respiratory:  Respirations regular and unlabored, clear to auscultation bilaterally. GI: Soft, nontender, nondistended, BS + x 4. MS: no deformity or atrophy. Skin: warm and dry, no rash. Neuro:  Strength and sensation are intact. Psych: AAOx3.  Normal affect.  Labs    CBC  Recent Labs  06/08/16 0341  WBC 6.3  HGB 10.6*  HCT 33.0*  MCV 87.1  PLT 225   Basic Metabolic Panel  Recent Labs  06/08/16 0341  NA 135  K 3.9  CL 102  CO2 25  GLUCOSE 94  BUN 12  CREATININE 0.77  CALCIUM 9.3   Liver Function Tests No results for input(s): AST, ALT, ALKPHOS, BILITOT, PROT, ALBUMIN in the last 72 hours. No results for input(s): LIPASE, AMYLASE in the last 72 hours. Cardiac Enzymes No results for input(s): CKTOTAL, CKMB, CKMBINDEX, TROPONINI in the last 72 hours. BNP Invalid input(s): POCBNP D-Dimer No results for input(s): DDIMER in the last 72 hours. Hemoglobin A1C No results for input(s): HGBA1C in the last 72 hours. Fasting Lipid Panel No results for input(s): CHOL, HDL, LDLCALC, TRIG, CHOLHDL, LDLDIRECT in the last 72 hours. Thyroid Function Tests No results for input(s): TSH, T4TOTAL, T3FREE, THYROIDAB in the last  72 hours.  Invalid input(s): FREET3  Telemetry    Sinus rhythm with PVCs multiple short burst of NSVT (2-3 beats) - Personally Reviewed  ECG    [name]  Radiology    No results found.  Cardiac Studies   TTE 06/06/16 LV EF: 50% -   55%  ------------------------------------------------------------------- Indications:      Mitral regurgitation 424.0.  ------------------------------------------------------------------- History:   PMH:   Mitral valve disease.  Mitral valve prolapse. Chronic obstructive pulmonary disease.  Risk factors:  HIV. Marfan&'s Syndrome. Current tobacco use. Diabetes mellitus.  ------------------------------------------------------------------- Study Conclusions  - Left ventricle: The cavity size was mildly dilated. Systolic   function was normal. The estimated ejection fraction was in the   range of 50% to 55%. Akinesis of the basal-midinferior   myocardium. - Mitral valve: The findings are consistent with mild stenosis.   There was mild to moderate regurgitation. - Left atrium: The atrium was mildly to moderately dilated.   TEE 06/09/16 LV EF: 60% -   65%  ------------------------------------------------------------------- Indications:      Endocarditis 421.9.  ------------------------------------------------------------------- Study Conclusions  - Left ventricle: The cavity size was normal. Wall thickness was   normal. Systolic function was normal. The estimated ejection   fraction was in the range of 60% to 65%. - Mitral valve: P3 medial scallop prolapse of posterior leaflet   with moderate anteriorly directed MR. There was moderate   regurgitation. - Left atrium: The atrium was dilated. - Right atrium: No evidence of thrombus in the atrial cavity or   appendage. - Atrial septum: No defect or patent foramen ovale was identified.  Impressions:  - No evidence of endocarditis. No cardiac source of emboli was    indentified.  Patient Profile     55 y.o. with Marfan's , history of VT (Cath 09/2013 with no CAD and MRI with no scar), off BB due to fatigue, COPD, chronic venous insufficiency, MR, HIV, hep B who who admitted with MSSA bacteremia and osteomyelitis s/p right foot fifth ray amputation.  Assessment & Plan    1. VT - telemetry with short burst of NSVT (less than 10 beats). Amiodarone d/c after surgery.  - Stable and asymptomatic. Continue current dose of metoprolol 50mg  BID. However he was off for years. He will let us know if fatigue and tired. F/u with Dr. Ladona Ridgel  2. Osteomyelitis with bacteremia - s/p ight foot fifth ray amputation 06/06/16. No vegetations.  3. MR - TTE showed mild stenosis and  mild to moderate mitral regurgitation. - TEE yesterday showed P3 medial  scallop prolapse of posterior leaflet with moderate anteriorly directed MR. There was moderate regurgitation. No evidence of endocarditis.    Signed, Manson PasseyBhagat,Bhavinkumar, PA  06/10/2016, 9:44 AM   Patient well known to me TEE yesterday with no SBE/Vegetations so hopefully can have 2 weeks of antibiotics For sepsis and osteomyelitis. Needs outpatient f/u for VT back on beta blocker quiescent now. Also will need F/u for MVP and MR which is significant due to P3 posterior leaflet prolapse Has loud MR murmur on exam  Charlton HawsPeter Meliza [name]

## 2016-06-10 NOTE — Progress Notes (Signed)
Pharmacy Antibiotic Note  Daymon LarsenGary W Fernicola is a 55 y.o. male with PMH of admitted on 06/04/2016 with right foot ulcer/osteomyelitis, s/p right foot fifth ray amputation on 06/06/2016. Patient initially placed on Vancomycin, Ceftriaxone, and Metronidazole, and then subsequently narrowed to Cefazolin per ID as blood culture growing MSSA. TTE negative for obvious vegetation.   Plan: D7 antibiotics, D5 Cefazolin  Continue Cefazolin 2g IV q8h; plan is for 14 days of abx from first clear Cx on 12/5  Pharmacy will sign off as Cx resulted and SCr is stable    Height: 6\' 2"  (188 cm) Weight: 205 lb (93 kg) IBW/kg (Calculated) : 82.2  Temp (24hrs), Avg:97.9 F (36.6 C), Min:97.4 F (36.3 C), Max:98.4 F (36.9 C)   Recent Labs Lab 06/04/16 1640 06/04/16 1646 06/04/16 2024 06/05/16 0551 06/06/16 0520 06/07/16 0417 06/08/16 0341  WBC 8.3  --   --  4.0 2.9* 4.9 6.3  CREATININE 1.00  --   --  0.90 0.81 0.74 0.77  LATICACIDVEN  --  1.80 0.85  --   --   --   --     Estimated Creatinine Clearance: 119.9 mL/min (by C-G formula based on SCr of 0.77 mg/dL).    Allergies  Allergen Reactions  . Tetracycline Hcl Swelling    Lips, facial swelling    Antimicrobials this admission: PTA Septra for right foot ulcer 12/2 >> Vancomycin >> 12/4 12/3 >> Ceftriaxone >> 12/4 12/3 >> Metronidazole >> 12/4 12/4 >> Cefazolin >>   Dose adjustments this admission: --   Microbiology results: 12/2 BCx: 1/2 sets with MSSA 12/3 MRSA PCR: negative 12/4 abscess Cx: Rare MSSA 12/5 repeat BCx: ng2d   Thank you for allowing pharmacy to be a part of this patient's care.   Bernadene Personrew Timonthy Hovater, PharmD, BCPS Pager: [phone] 06/10/2016, 8:25 AM

## 2016-06-11 LAB — AEROBIC/ANAEROBIC CULTURE W GRAM STAIN (SURGICAL/DEEP WOUND)

## 2016-06-11 LAB — AEROBIC/ANAEROBIC CULTURE (SURGICAL/DEEP WOUND)

## 2016-06-12 DIAGNOSIS — I472 Ventricular tachycardia: Secondary | ICD-10-CM | POA: Diagnosis not present

## 2016-06-12 DIAGNOSIS — B9561 Methicillin susceptible Staphylococcus aureus infection as the cause of diseases classified elsewhere: Secondary | ICD-10-CM | POA: Diagnosis not present

## 2016-06-12 DIAGNOSIS — I340 Nonrheumatic mitral (valve) insufficiency: Secondary | ICD-10-CM | POA: Diagnosis not present

## 2016-06-12 DIAGNOSIS — R7881 Bacteremia: Secondary | ICD-10-CM | POA: Diagnosis not present

## 2016-06-12 DIAGNOSIS — M86171 Other acute osteomyelitis, right ankle and foot: Secondary | ICD-10-CM | POA: Diagnosis not present

## 2016-06-12 DIAGNOSIS — I872 Venous insufficiency (chronic) (peripheral): Secondary | ICD-10-CM | POA: Diagnosis not present

## 2016-06-12 DIAGNOSIS — E1142 Type 2 diabetes mellitus with diabetic polyneuropathy: Secondary | ICD-10-CM | POA: Diagnosis not present

## 2016-06-12 DIAGNOSIS — J849 Interstitial pulmonary disease, unspecified: Secondary | ICD-10-CM | POA: Diagnosis not present

## 2016-06-12 DIAGNOSIS — J449 Chronic obstructive pulmonary disease, unspecified: Secondary | ICD-10-CM | POA: Diagnosis not present

## 2016-06-12 LAB — CULTURE, BLOOD (ROUTINE X 2)
CULTURE: NO GROWTH
Culture: NO GROWTH

## 2016-06-13 ENCOUNTER — Other Ambulatory Visit: Payer: Self-pay | Admitting: *Deleted

## 2016-06-13 ENCOUNTER — Encounter: Payer: Self-pay | Admitting: Infectious Diseases

## 2016-06-13 DIAGNOSIS — I340 Nonrheumatic mitral (valve) insufficiency: Secondary | ICD-10-CM | POA: Diagnosis not present

## 2016-06-13 DIAGNOSIS — G629 Polyneuropathy, unspecified: Secondary | ICD-10-CM

## 2016-06-13 DIAGNOSIS — I472 Ventricular tachycardia: Secondary | ICD-10-CM | POA: Diagnosis not present

## 2016-06-13 DIAGNOSIS — J449 Chronic obstructive pulmonary disease, unspecified: Secondary | ICD-10-CM | POA: Diagnosis not present

## 2016-06-13 DIAGNOSIS — Z452 Encounter for adjustment and management of vascular access device: Secondary | ICD-10-CM | POA: Diagnosis not present

## 2016-06-13 DIAGNOSIS — M86171 Other acute osteomyelitis, right ankle and foot: Secondary | ICD-10-CM | POA: Diagnosis not present

## 2016-06-13 DIAGNOSIS — I872 Venous insufficiency (chronic) (peripheral): Secondary | ICD-10-CM | POA: Diagnosis not present

## 2016-06-13 DIAGNOSIS — J849 Interstitial pulmonary disease, unspecified: Secondary | ICD-10-CM | POA: Diagnosis not present

## 2016-06-13 DIAGNOSIS — E1142 Type 2 diabetes mellitus with diabetic polyneuropathy: Secondary | ICD-10-CM | POA: Diagnosis not present

## 2016-06-13 DIAGNOSIS — B9561 Methicillin susceptible Staphylococcus aureus infection as the cause of diseases classified elsewhere: Secondary | ICD-10-CM | POA: Diagnosis not present

## 2016-06-13 DIAGNOSIS — R7881 Bacteremia: Secondary | ICD-10-CM | POA: Diagnosis not present

## 2016-06-13 MED ORDER — MORPHINE SULFATE ER 40 MG PO CP24: 40.0000 mg | ORAL_CAPSULE | Freq: Two times a day (BID) | ORAL | 0 refills | Status: DC

## 2016-06-15 ENCOUNTER — Ambulatory Visit: Payer: Medicare Other | Admitting: Infectious Diseases

## 2016-06-16 ENCOUNTER — Ambulatory Visit (INDEPENDENT_AMBULATORY_CARE_PROVIDER_SITE_OTHER): Payer: Medicare Other | Admitting: Family

## 2016-06-16 ENCOUNTER — Encounter: Payer: Self-pay | Admitting: Physician Assistant

## 2016-06-16 VITALS — Ht 74.0 in | Wt 205.0 lb

## 2016-06-16 DIAGNOSIS — Z89431 Acquired absence of right foot: Secondary | ICD-10-CM

## 2016-06-16 DIAGNOSIS — IMO0002 Reserved for concepts with insufficient information to code with codable children: Secondary | ICD-10-CM

## 2016-06-16 NOTE — Progress Notes (Signed)
Office Visit Note   Patient: Johnathan LarsenGary W Taul           Date of Birth: [birth year]           MRN: [id number] Visit Date: 06/16/2016              Requested by: Ginnie SmartJeffrey C Hatcher, MD [id number] [place], [place] [address] PCP: Johny SaxJeffrey Hatcher, MD   Assessment & Plan: Visit Diagnoses:  1. Foot amputation status, right (HCC)     Plan: He will cleanse the incision daily. Apply dry dressing. May shower and get this wet. Do not submerge in water. Continue protected weightbearing. Elevation for swelling.  Follow-Up Instructions: Return in about 2 weeks (around 06/30/2016).   Orders:  No orders of the defined types were placed in this encounter.  No orders of the defined types were placed in this encounter.     Procedures: No procedures performed   Clinical Data: No additional findings.   Subjective: Chief Complaint  Patient presents with  . Right Foot - Routine Post Op     AMPUTATION RAY RIGHT FIFTH RAY Local tissue rearrangement for wound 06/06/16      AMPUTATION RAY RIGHT FIFTH RAY 1 1/2 weeks out. He is weight bearing in a Darco shoe and no assistive device. He appears a little off balance. The incision is slightly open at the distal end and there is some swelling and a little pink. The pt has a small amount of blood drain. The pt does not complain of pain.     Review of Systems  Constitutional: Negative for chills and fever.     Objective: Vital Signs: Ht 6\' 2"  (1.88 m)   Wt 205 lb (93 kg)   BMI 26.32 kg/m   Physical Exam  Constitutional: He is oriented to person, place, and time. He appears well-developed and well-nourished.  Pulmonary/Chest: Effort normal.  Musculoskeletal:  Right foot fifth Ray amputation is approximated with sutures. There is a little bit of gaping around the fourth metatarsal head. This does not probe to bone. No erythema, drainage or sign of infection.  Neurological: He is alert and oriented to person, place, and time.    Psychiatric: He has a normal mood and affect.  Nursing note reviewed.   Ortho Exam  Specialty Comments:  No specialty comments available.  Imaging: No results found.   PMFS History: Patient Active Problem List   Diagnosis Date Noted  . Foot amputation status, right (HCC) 06/16/2016  . Bacteremia due to Staphylococcus aureus 06/07/2016  . Malnutrition of moderate degree 06/06/2016  . Osteomyelitis of foot, right, acute (HCC) 06/05/2016  . Foot ulcer, right (HCC)   . Pyogenic inflammation of bone (HCC) 06/04/2016  . Cellulitis 06/04/2016  . AIDS (HCC) 06/09/2015  . 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 GERD (gastroesophageal reflux disease) 06/09/2015  . COPD exacerbation (HCC) 12/08/2014  . Folliculitis barbae 12/08/2014  . Rhinosinusitis 12/08/2014  . ILD (interstitial lung disease) (HCC) 12/08/2014  . Neuropathic foot ulcer (HCC) 10/29/2014  . PTSD (post-traumatic stress disorder) 09/23/2013  . Bilateral lower extremity edema- hx venous RFA 9/14 09/23/2013  . VT (ventricular tachycardia) (HCC) 09/23/2013  . Falls 06/12/2013  . Reflux 05/07/2012  . Mental status change 02/02/2011  . TOBACCO ABUSE 01/25/2010  . Varicose veins of lower extremities with ulcer (HCC) 08/11/2009  . DERMATITIS NOS 01/21/2008  . Chest pressure - when in VT 12/18/2007  . DIARRHEA 11/22/2007  .  Type 2 diabetes, controlled, with peripheral neuropathy (HCC) 04/13/2006  . Depression 04/13/2006  . NEUROPATHY 04/13/2006  . COPD with acute bronchitis (HCC) 04/13/2006  . 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 HEPATITIS B, HX OF 04/13/2006   Past Medical History:  Diagnosis Date  . Cholesteatoma   . 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 COPD (chronic obstructive pulmonary disease) (HCC)   . Depression   . Folliculitis barbae 12/08/2014  . GERD (gastroesophageal reflux disease)  06/09/2015  . HIV (human immunodeficiency virus infection) (HCC)   . ILD (interstitial lung disease) (HCC) 12/08/2014  . Marfan syndrome    Dr Ninetta LightsHatcher follows  . Mitral valve prolapse   . Rhinosinusitis 12/08/2014  . Venous insufficiency (chronic) (peripheral)   . VT (ventricular tachycardia) (HCC)     Family History  Problem Relation Age of Onset  . Cancer Mother   . Hypertension Mother   . Stroke Sister   . Hypertension Sister   . Aneurysm Sister     Past Surgical History:  Procedure Laterality Date  . AMPUTATION Right 06/06/2016   Procedure: AMPUTATION RAY RIGHT FIFTH RAY;  Surgeon: Nadara MustardMarcus V Duda, MD;  Location: [facility];  Service: Orthopedics;  Laterality: Right;  . APPENDECTOMY    . cholesteotoma removal    . LEFT HEART CATHETERIZATION WITH CORONARY ANGIOGRAM [name] 09/24/2013   Procedure: LEFT HEART CATHETERIZATION WITH CORONARY ANGIOGRAM;  Surgeon: Marykay Lexavid W Harding, MD;  [facility];  Service: Cardiovascular;  Laterality: [name];  . repair of perforated colon    . TEE WITHOUT CARDIOVERSION [name] 06/09/2016   Procedure: TRANSESOPHAGEAL ECHOCARDIOGRAM (TEE);  Surgeon: Wendall StadePeter C Nishan, MD;  [facility];  Service: Cardiovascular;  Laterality: [name];   Social History   Occupational History  . Not on file.   Social History Main Topics  . Smoking status: Current Some Day Smoker    Packs/day: 0.25    Years: 35.00    Types: Cigarettes  . Smokeless tobacco: Never Used     Comment: trying to cut back  . Alcohol use No  . Drug use: No  . Sexual activity: No     Comment: pt. declined condoms

## 2016-06-17 DIAGNOSIS — M86171 Other acute osteomyelitis, right ankle and foot: Secondary | ICD-10-CM | POA: Diagnosis not present

## 2016-06-17 DIAGNOSIS — E1142 Type 2 diabetes mellitus with diabetic polyneuropathy: Secondary | ICD-10-CM | POA: Diagnosis not present

## 2016-06-17 DIAGNOSIS — B9561 Methicillin susceptible Staphylococcus aureus infection as the cause of diseases classified elsewhere: Secondary | ICD-10-CM | POA: Diagnosis not present

## 2016-06-17 DIAGNOSIS — I472 Ventricular tachycardia: Secondary | ICD-10-CM | POA: Diagnosis not present

## 2016-06-17 DIAGNOSIS — I872 Venous insufficiency (chronic) (peripheral): Secondary | ICD-10-CM | POA: Diagnosis not present

## 2016-06-17 DIAGNOSIS — I340 Nonrheumatic mitral (valve) insufficiency: Secondary | ICD-10-CM | POA: Diagnosis not present

## 2016-06-17 DIAGNOSIS — J449 Chronic obstructive pulmonary disease, unspecified: Secondary | ICD-10-CM | POA: Diagnosis not present

## 2016-06-17 DIAGNOSIS — J849 Interstitial pulmonary disease, unspecified: Secondary | ICD-10-CM | POA: Diagnosis not present

## 2016-06-17 DIAGNOSIS — R7881 Bacteremia: Secondary | ICD-10-CM | POA: Diagnosis not present

## 2016-06-20 DIAGNOSIS — B9561 Methicillin susceptible Staphylococcus aureus infection as the cause of diseases classified elsewhere: Secondary | ICD-10-CM | POA: Diagnosis not present

## 2016-06-20 DIAGNOSIS — M86171 Other acute osteomyelitis, right ankle and foot: Secondary | ICD-10-CM | POA: Diagnosis not present

## 2016-06-20 DIAGNOSIS — E1142 Type 2 diabetes mellitus with diabetic polyneuropathy: Secondary | ICD-10-CM | POA: Diagnosis not present

## 2016-06-20 DIAGNOSIS — J449 Chronic obstructive pulmonary disease, unspecified: Secondary | ICD-10-CM | POA: Diagnosis not present

## 2016-06-20 DIAGNOSIS — I872 Venous insufficiency (chronic) (peripheral): Secondary | ICD-10-CM | POA: Diagnosis not present

## 2016-06-20 DIAGNOSIS — J849 Interstitial pulmonary disease, unspecified: Secondary | ICD-10-CM | POA: Diagnosis not present

## 2016-06-20 DIAGNOSIS — R7881 Bacteremia: Secondary | ICD-10-CM | POA: Diagnosis not present

## 2016-06-20 DIAGNOSIS — I340 Nonrheumatic mitral (valve) insufficiency: Secondary | ICD-10-CM | POA: Diagnosis not present

## 2016-06-20 DIAGNOSIS — I472 Ventricular tachycardia: Secondary | ICD-10-CM | POA: Diagnosis not present

## 2016-06-20 DIAGNOSIS — Z452 Encounter for adjustment and management of vascular access device: Secondary | ICD-10-CM | POA: Diagnosis not present

## 2016-06-21 ENCOUNTER — Telehealth: Payer: Self-pay | Admitting: Cardiovascular Disease

## 2016-06-21 DIAGNOSIS — R7881 Bacteremia: Secondary | ICD-10-CM | POA: Diagnosis not present

## 2016-06-21 DIAGNOSIS — B9561 Methicillin susceptible Staphylococcus aureus infection as the cause of diseases classified elsewhere: Secondary | ICD-10-CM | POA: Diagnosis not present

## 2016-06-21 DIAGNOSIS — E1142 Type 2 diabetes mellitus with diabetic polyneuropathy: Secondary | ICD-10-CM | POA: Diagnosis not present

## 2016-06-21 DIAGNOSIS — J449 Chronic obstructive pulmonary disease, unspecified: Secondary | ICD-10-CM | POA: Diagnosis not present

## 2016-06-21 DIAGNOSIS — I872 Venous insufficiency (chronic) (peripheral): Secondary | ICD-10-CM | POA: Diagnosis not present

## 2016-06-21 DIAGNOSIS — I472 Ventricular tachycardia: Secondary | ICD-10-CM | POA: Diagnosis not present

## 2016-06-21 DIAGNOSIS — J849 Interstitial pulmonary disease, unspecified: Secondary | ICD-10-CM | POA: Diagnosis not present

## 2016-06-21 DIAGNOSIS — I340 Nonrheumatic mitral (valve) insufficiency: Secondary | ICD-10-CM | POA: Diagnosis not present

## 2016-06-21 DIAGNOSIS — M86171 Other acute osteomyelitis, right ankle and foot: Secondary | ICD-10-CM | POA: Diagnosis not present

## 2016-06-28 NOTE — Progress Notes (Signed)
Cardiology Office Note Date:  06/30/2016  Patient ID:  [name], DOB 20-Dec-1960, MRN [id number] PCP:  Johny SaxJeffrey Hatcher, MD  Cardiologist:  Dr. Ladona Ridgelaylor   Chief Complaint: f/u hospital  History of Present Illness: [name] is a 55 y.o. male with history of VT. Cath 09/2013 with no CAD and MRI with no scar, ILD/COPD chronic hep B w/cirrhosis, HIV+/AIDS, marfan's syndrome, [facility]o [facility] R foot undergoing amputation 06/06/16.  Pre-op he had short NSVT and cardiology consulter, Dr. Eden EmmsNishan noted that he was supposed to be on beta blocker per EP but patient stopped last year because it made his HR too slow, he was noted on telemetry to have brief NSVT and resumed on BB (and peri-op for R 5th toe amputation IV amiodarone and then stopped). On exam and a very loud SM, TTE noted only mild-mod MR, he developed bacteremia and underwent TEE 06/09/16 Normal EF 65% Severe prolapse P3 segment of posterior leaflet with moderate MR Normal AV, TV, PV Mild LAE No LAA thrombus NO SBE No Vegetations Mild aortic debris AO mildly thickened leaflets, no dissection  He was discharged on the metoprolol 50mg  BID with instructions to f/u to monitor for intolerance/bradycardia.  He is doing well, reports the picc line was removed and is healing well.  Was told he had good circulation/pulses in his feet at his last check up.  He comes today to be seen for dr. Ladona Ridgelaylor, he denies any kind of cardiac symptoms, no CP, palpitations or SOB, no dizziness, near syncope or syncope.  He is tolerating the lopressor well.   Past Medical History:  Diagnosis Date  . Cholesteatoma   . 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 COPD (chronic obstructive pulmonary disease) (HCC)   . Depression   . Folliculitis barbae 12/08/2014  . GERD (gastroesophageal reflux disease) 06/09/2015  . HIV (human immunodeficiency virus infection) (HCC)   . ILD (interstitial lung disease)  (HCC) 12/08/2014  . Marfan syndrome    Dr Ninetta LightsHatcher follows  . Mitral valve prolapse   . Rhinosinusitis 12/08/2014  . Venous insufficiency (chronic) (peripheral)   . VT (ventricular tachycardia) (HCC)     Past Surgical History:  Procedure Laterality Date  . AMPUTATION Right 06/06/2016   Procedure: AMPUTATION RAY RIGHT FIFTH RAY;  Surgeon: Nadara MustardMarcus V Duda, MD;  Location: [facility];  Service: Orthopedics;  Laterality: Right;  . APPENDECTOMY    . cholesteotoma removal    . LEFT HEART CATHETERIZATION WITH CORONARY ANGIOGRAM [name] 09/24/2013   Procedure: LEFT HEART CATHETERIZATION WITH CORONARY ANGIOGRAM;  Surgeon: Marykay Lexavid W Harding, MD;  [facility];  Service: Cardiovascular;  Laterality: [name];  . repair of perforated colon    . TEE WITHOUT CARDIOVERSION [name] 06/09/2016   Procedure: TRANSESOPHAGEAL ECHOCARDIOGRAM (TEE);  Surgeon: Wendall StadePeter C Nishan, MD;  [facility];  Service: Cardiovascular;  Laterality: [name];    Current Outpatient Prescriptions  Medication Sig Dispense Refill  . ALPRAZolam (XANAX) 0.5 MG tablet Take 0.5 mg by mouth 3 (three) times daily as needed for anxiety.    Marland Kitchen. ceFAZolin (ANCEF) 2-4 GM/100ML-% IVPB Inject 100 mLs (2 g total) into the vein every 8 (eight) hours. 1 each   . clonazePAM (KLONOPIN) 1 MG tablet Take 1 mg by mouth at bedtime.    . DESCOVY 200-25 MG tablet TAKE 1 TABLET BY MOUTH DAILY. 30 tablet 11  . furosemide (LASIX) 40 MG tablet TAKE 1/2 TABLET (20 MG  TOTAL) BY BY MOUTH EVERY MORNING. 30 tablet 5  . metoprolol (LOPRESSOR) 50 MG tablet Take 1 tablet (50 mg total) by mouth 2 (two) times daily. 60 tablet 0  . Morphine Sulfate 40 MG CP24 Take 40 mg by mouth 2 (two) times daily. 60 capsule 0  . Multiple Vitamins-Minerals (MULTIVITAMIN WITH MINERALS) tablet Take 1 tablet by mouth daily.    . nortriptyline (PAMELOR) 75 MG capsule Take 150 mg by mouth at bedtime.    Marland Kitchen. spironolactone (ALDACTONE) 25 MG tablet TAKE 1 TABLET BY MOUTH ONCE DAILY. 30 tablet 5  . TIVICAY  50 MG tablet TAKE 1 TABLET BY MOUTH DAILY. 30 tablet 11   No current facility-administered medications for this visit.     Allergies:   Tetracycline hcl   Social History:  The patient  reports that he has been smoking Cigarettes.  He has a 8.75 pack-year smoking history. He has never used smokeless tobacco. He reports that he does not drink alcohol or use drugs.   Family History:  The patient's family history includes Aneurysm in his sister; Cancer in his mother; Hypertension in his mother and sister; Stroke in his sister.  ROS:  Please see the history of present illness All other systems are reviewed and otherwise negative.   PHYSICAL EXAM:  VS:  BP 108/64   Pulse 80   Ht 6\' 2"  (1.88 m)   Wt 217 lb (98.4 kg)   BMI 27.86 kg/m  BMI: Body mass index is 27.86 kg/m. Well nourished, well developed, in no acute distress  HEENT: normocephalic, atraumatic  Neck: no JVD, carotid bruits or masses Cardiac: RRR; 2/6 SM, no rubs, or gallops Lungs:  clear to auscultation bilaterally, no wheezing, rhonchi or rales  Abd: soft, nontender MS: no deformity or atrophy Ext: trace edema, chronic looking skin changes b/l, orthopedic shoe RLE Skin: warm and dry, no rash Neuro:  No gross deficits appreciated Psych: euthymic mood, full affect    EKG:  Done 06/06/16 SR, nonspecific IVCD  Recent Labs: 06/05/2016: ALT 10; TSH 0.903 06/07/2016: Magnesium 2.0 06/08/2016: BUN 12; Creatinine, Ser 0.77; Hemoglobin 10.6; Platelets 225; Potassium 3.9; Sodium 135  02/12/2016: Cholesterol 144; HDL 43; LDL Cholesterol 86; Total CHOL/HDL Ratio 3.3; Triglycerides 76; VLDL 15   CrCl cannot be calculated (Patient's most recent lab result is older than the maximum 21 days allowed.).   Wt Readings from Last 3 Encounters:  06/30/16 217 lb (98.4 kg)  06/16/16 205 lb (93 kg)  06/04/16 205 lb (93 kg)     Other studies reviewed: Additional studies/records reviewed today include: summarized above  ASSESSMENT AND  PLAN:  1. VT     Back on BB     No palpitations/syncope or symptoms of bradycardia       2. MR      Severe prolapse P3 segment of posterior leaflet with moderate MR     Annual echos   Disposition: F/u with Dr. Ladona Ridgelaylor in year, sooner if needed.   Current medicines are reviewed at length with the patient today.  The patient did not have any concerns regarding medicines.  Judith BlonderSigned, Renee Ursy, PA-C 06/30/2016 9:51 AM     [facility] [address] [place] [address] [phone] (office)  [phone] (fax)

## 2016-06-29 DIAGNOSIS — I472 Ventricular tachycardia: Secondary | ICD-10-CM | POA: Diagnosis not present

## 2016-06-29 DIAGNOSIS — I872 Venous insufficiency (chronic) (peripheral): Secondary | ICD-10-CM | POA: Diagnosis not present

## 2016-06-29 DIAGNOSIS — R7881 Bacteremia: Secondary | ICD-10-CM | POA: Diagnosis not present

## 2016-06-29 DIAGNOSIS — E1142 Type 2 diabetes mellitus with diabetic polyneuropathy: Secondary | ICD-10-CM | POA: Diagnosis not present

## 2016-06-29 DIAGNOSIS — J449 Chronic obstructive pulmonary disease, unspecified: Secondary | ICD-10-CM | POA: Diagnosis not present

## 2016-06-29 DIAGNOSIS — M86171 Other acute osteomyelitis, right ankle and foot: Secondary | ICD-10-CM | POA: Diagnosis not present

## 2016-06-29 DIAGNOSIS — J849 Interstitial pulmonary disease, unspecified: Secondary | ICD-10-CM | POA: Diagnosis not present

## 2016-06-29 DIAGNOSIS — I340 Nonrheumatic mitral (valve) insufficiency: Secondary | ICD-10-CM | POA: Diagnosis not present

## 2016-06-29 DIAGNOSIS — B9561 Methicillin susceptible Staphylococcus aureus infection as the cause of diseases classified elsewhere: Secondary | ICD-10-CM | POA: Diagnosis not present

## 2016-06-30 ENCOUNTER — Encounter: Payer: Self-pay | Admitting: Physician Assistant

## 2016-06-30 ENCOUNTER — Ambulatory Visit (INDEPENDENT_AMBULATORY_CARE_PROVIDER_SITE_OTHER): Payer: Medicare Other | Admitting: Family

## 2016-06-30 ENCOUNTER — Other Ambulatory Visit: Payer: Self-pay | Admitting: Podiatry

## 2016-06-30 ENCOUNTER — Other Ambulatory Visit: Payer: Self-pay | Admitting: Infectious Diseases

## 2016-06-30 ENCOUNTER — Encounter (INDEPENDENT_AMBULATORY_CARE_PROVIDER_SITE_OTHER): Payer: Self-pay

## 2016-06-30 ENCOUNTER — Ambulatory Visit (INDEPENDENT_AMBULATORY_CARE_PROVIDER_SITE_OTHER): Payer: Medicare Other | Admitting: Physician Assistant

## 2016-06-30 ENCOUNTER — Encounter (INDEPENDENT_AMBULATORY_CARE_PROVIDER_SITE_OTHER): Payer: Self-pay | Admitting: Orthopedic Surgery

## 2016-06-30 VITALS — Ht 74.0 in | Wt 217.0 lb

## 2016-06-30 VITALS — BP 108/64 | HR 80 | Ht 74.0 in | Wt 217.0 lb

## 2016-06-30 DIAGNOSIS — I340 Nonrheumatic mitral (valve) insufficiency: Secondary | ICD-10-CM | POA: Diagnosis not present

## 2016-06-30 DIAGNOSIS — I472 Ventricular tachycardia, unspecified: Secondary | ICD-10-CM

## 2016-06-30 DIAGNOSIS — Z89431 Acquired absence of right foot: Secondary | ICD-10-CM

## 2016-06-30 DIAGNOSIS — I341 Nonrheumatic mitral (valve) prolapse: Secondary | ICD-10-CM

## 2016-06-30 DIAGNOSIS — IMO0002 Reserved for concepts with insufficient information to code with codable children: Secondary | ICD-10-CM

## 2016-06-30 DIAGNOSIS — R110 Nausea: Secondary | ICD-10-CM

## 2016-06-30 DIAGNOSIS — L98491 Non-pressure chronic ulcer of skin of other sites limited to breakdown of skin: Secondary | ICD-10-CM

## 2016-06-30 MED ORDER — OXYCODONE-ACETAMINOPHEN 5-325 MG PO TABS: 1.0000 | ORAL_TABLET | Freq: Two times a day (BID) | ORAL | 0 refills | Status: DC | PRN

## 2016-06-30 NOTE — Progress Notes (Signed)
Office Visit Note   Patient: [name]           Date of Birth: [birth year]           MRN: [id number] Visit Date: 06/30/2016              Requested by: Ginnie SmartJeffrey C Hatcher, MD [address] [place], [place] [id number] PCP: Johny SaxJeffrey Hatcher, MD   Assessment & Plan: Visit Diagnoses:  1. Foot amputation status, right (HCC)     Plan: Encouraged elevation. Continue with daily wound cleansing and dry dressings. Sutures were harvested today. He will continue to minimize his weightbearing in a Darco shoe.  Follow-Up Instructions: Return in about 2 weeks (around 07/14/2016).   Orders:  No orders of the defined types were placed in this encounter.  Meds ordered this encounter  Medications  . oxyCODONE-acetaminophen (PERCOCET/ROXICET) 5-325 MG tablet    Sig: Take 1 tablet by mouth 2 (two) times daily as needed for severe pain.    Dispense:  28 tablet    Refill:  0      Procedures: No procedures performed   Clinical Data: No additional findings.   Subjective: Chief Complaint  Patient presents with  . Right Foot - Routine Post Op    AMPUTATION RAY RIGHT FIFTH RAY Local tissue rearrangement for wound 06/06/16    Patient presents for two week follow up right foot 5th ray amputation. There are sutures intact. Home health nursing coming once a week. There is very minimal drainage. There is redness and edema. He has increased weightbearing over the holidays, he is ambulating with darco shoe.    Review of Systems  Constitutional: Negative for chills and fever.     Objective: Vital Signs: Ht 6\' 2"  (1.88 m)   Wt 217 lb (98.4 kg)   BMI 27.86 kg/m   Physical Exam  Constitutional: He is oriented to person, place, and time. He appears well-developed and well-nourished.  Pulmonary/Chest: Effort normal.  Musculoskeletal:  Right fifth ray amputation is healing well. There is a 15 mm length centrally that has yet to heal. This is superficial. Does not probe. There is no  drainage no odor no surrounding erythema. Does have moderate swelling to the foot and ankle.  Neurological: He is alert and oriented to person, place, and time.  Psychiatric: He has a normal mood and affect.  Nursing note reviewed.   Ortho Exam  Specialty Comments:  No specialty comments available.  Imaging: No results found.   PMFS History: Patient Active Problem List   Diagnosis Date Noted  . Foot amputation status, right (HCC) 06/16/2016  . Bacteremia due to Staphylococcus aureus 06/07/2016  . Malnutrition of moderate degree 06/06/2016  . Osteomyelitis of foot, right, acute (HCC) 06/05/2016  . Foot ulcer, right (HCC)   . Pyogenic inflammation of bone (HCC) 06/04/2016  . Cellulitis 06/04/2016  . AIDS (HCC) 06/09/2015  . 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 GERD (gastroesophageal reflux disease) 06/09/2015  . COPD exacerbation (HCC) 12/08/2014  . Folliculitis barbae 12/08/2014  . Rhinosinusitis 12/08/2014  . ILD (interstitial lung disease) (HCC) 12/08/2014  . Neuropathic foot ulcer (HCC) 10/29/2014  . PTSD (post-traumatic stress disorder) 09/23/2013  . Bilateral lower extremity edema- hx venous RFA 9/14 09/23/2013  . VT (ventricular tachycardia) (HCC) 09/23/2013  . Falls 06/12/2013  . Reflux 05/07/2012  . Mental status change 02/02/2011  . TOBACCO ABUSE 01/25/2010  . Varicose veins of lower extremities with ulcer (  HCC) 08/11/2009  . DERMATITIS NOS 01/21/2008  . Chest pressure - when in VT 12/18/2007  . DIARRHEA 11/22/2007  . Type 2 diabetes, controlled, with peripheral neuropathy (HCC) 04/13/2006  . Depression 04/13/2006  . NEUROPATHY 04/13/2006  . COPD with acute bronchitis (HCC) 04/13/2006  . 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 HEPATITIS B, HX OF 04/13/2006   Past Medical History:  Diagnosis Date  . Cholesteatoma   . 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 COPD (chronic obstructive pulmonary disease) (HCC)   . Depression   . Folliculitis barbae 12/08/2014  . GERD (gastroesophageal reflux disease) 06/09/2015  . HIV (human immunodeficiency virus infection) (HCC)   . ILD (interstitial lung disease) (HCC) 12/08/2014  . Marfan syndrome    Dr Ninetta LightsHatcher follows  . Mitral valve prolapse   . Rhinosinusitis 12/08/2014  . Venous insufficiency (chronic) (peripheral)   . VT (ventricular tachycardia) (HCC)     Family History  Problem Relation Age of Onset  . Cancer Mother   . Hypertension Mother   . Stroke Sister   . Hypertension Sister   . Aneurysm Sister     Past Surgical History:  Procedure Laterality Date  . AMPUTATION Right 06/06/2016   Procedure: AMPUTATION RAY RIGHT FIFTH RAY;  Surgeon: Nadara MustardMarcus V Duda, MD;  Location: [facility];  Service: Orthopedics;  Laterality: Right;  . APPENDECTOMY    . cholesteotoma removal    . LEFT HEART CATHETERIZATION WITH CORONARY ANGIOGRAM [name] 09/24/2013   Procedure: LEFT HEART CATHETERIZATION WITH CORONARY ANGIOGRAM;  Surgeon: Marykay Lexavid W Harding, MD;  [facility];  Service: Cardiovascular;  Laterality: [name];  . repair of perforated colon    . TEE WITHOUT CARDIOVERSION [name] 06/09/2016   Procedure: TRANSESOPHAGEAL ECHOCARDIOGRAM (TEE);  Surgeon: Wendall StadePeter C Nishan, MD;  [facility];  Service: Cardiovascular;  Laterality: [name];   Social History   Occupational History  . Not on file.   Social History Main Topics  . Smoking status: Current Some Day Smoker    Packs/day: 0.25    Years: 35.00    Types: Cigarettes  . Smokeless tobacco: Never Used     Comment: trying to cut back  . Alcohol use No  . Drug use: No  . Sexual activity: No     Comment: pt. declined condoms

## 2016-06-30 NOTE — Patient Instructions (Signed)
Medication Instructions:   Your physician recommends that you continue on your current medications as directed. Please refer to the Current Medication list given to you today.   If you need a refill on your cardiac medications before your next appointment, please call your pharmacy.  Labwork: NONE ORDERED  TODAY    Testing/Procedures: NONE ORDERED  TODAY    Follow-Up: Your physician wants you to follow-up in: ONE YEAR WITH  You will receive a reminder letter in the mail two months in advance. If you don't receive a letter, please call our office to schedule the follow-up appointment.     Any Other Special Instructions Will Be Listed Below (If Applicable).

## 2016-07-01 DIAGNOSIS — B9561 Methicillin susceptible Staphylococcus aureus infection as the cause of diseases classified elsewhere: Secondary | ICD-10-CM | POA: Diagnosis not present

## 2016-07-01 DIAGNOSIS — I340 Nonrheumatic mitral (valve) insufficiency: Secondary | ICD-10-CM | POA: Diagnosis not present

## 2016-07-01 DIAGNOSIS — E1142 Type 2 diabetes mellitus with diabetic polyneuropathy: Secondary | ICD-10-CM | POA: Diagnosis not present

## 2016-07-01 DIAGNOSIS — R7881 Bacteremia: Secondary | ICD-10-CM | POA: Diagnosis not present

## 2016-07-01 DIAGNOSIS — I472 Ventricular tachycardia: Secondary | ICD-10-CM | POA: Diagnosis not present

## 2016-07-01 DIAGNOSIS — M86171 Other acute osteomyelitis, right ankle and foot: Secondary | ICD-10-CM | POA: Diagnosis not present

## 2016-07-01 DIAGNOSIS — J449 Chronic obstructive pulmonary disease, unspecified: Secondary | ICD-10-CM | POA: Diagnosis not present

## 2016-07-01 DIAGNOSIS — J849 Interstitial pulmonary disease, unspecified: Secondary | ICD-10-CM | POA: Diagnosis not present

## 2016-07-01 DIAGNOSIS — I872 Venous insufficiency (chronic) (peripheral): Secondary | ICD-10-CM | POA: Diagnosis not present

## 2016-07-06 DIAGNOSIS — I340 Nonrheumatic mitral (valve) insufficiency: Secondary | ICD-10-CM | POA: Diagnosis not present

## 2016-07-06 DIAGNOSIS — J449 Chronic obstructive pulmonary disease, unspecified: Secondary | ICD-10-CM | POA: Diagnosis not present

## 2016-07-06 DIAGNOSIS — R7881 Bacteremia: Secondary | ICD-10-CM | POA: Diagnosis not present

## 2016-07-06 DIAGNOSIS — E1142 Type 2 diabetes mellitus with diabetic polyneuropathy: Secondary | ICD-10-CM | POA: Diagnosis not present

## 2016-07-06 DIAGNOSIS — I872 Venous insufficiency (chronic) (peripheral): Secondary | ICD-10-CM | POA: Diagnosis not present

## 2016-07-06 DIAGNOSIS — I472 Ventricular tachycardia: Secondary | ICD-10-CM | POA: Diagnosis not present

## 2016-07-06 DIAGNOSIS — J849 Interstitial pulmonary disease, unspecified: Secondary | ICD-10-CM | POA: Diagnosis not present

## 2016-07-06 DIAGNOSIS — M86171 Other acute osteomyelitis, right ankle and foot: Secondary | ICD-10-CM | POA: Diagnosis not present

## 2016-07-06 DIAGNOSIS — B9561 Methicillin susceptible Staphylococcus aureus infection as the cause of diseases classified elsewhere: Secondary | ICD-10-CM | POA: Diagnosis not present

## 2016-07-08 DIAGNOSIS — I340 Nonrheumatic mitral (valve) insufficiency: Secondary | ICD-10-CM | POA: Diagnosis not present

## 2016-07-08 DIAGNOSIS — I872 Venous insufficiency (chronic) (peripheral): Secondary | ICD-10-CM | POA: Diagnosis not present

## 2016-07-08 DIAGNOSIS — M86171 Other acute osteomyelitis, right ankle and foot: Secondary | ICD-10-CM | POA: Diagnosis not present

## 2016-07-08 DIAGNOSIS — J449 Chronic obstructive pulmonary disease, unspecified: Secondary | ICD-10-CM | POA: Diagnosis not present

## 2016-07-08 DIAGNOSIS — J849 Interstitial pulmonary disease, unspecified: Secondary | ICD-10-CM | POA: Diagnosis not present

## 2016-07-08 DIAGNOSIS — I472 Ventricular tachycardia: Secondary | ICD-10-CM | POA: Diagnosis not present

## 2016-07-08 DIAGNOSIS — E1142 Type 2 diabetes mellitus with diabetic polyneuropathy: Secondary | ICD-10-CM | POA: Diagnosis not present

## 2016-07-08 DIAGNOSIS — R7881 Bacteremia: Secondary | ICD-10-CM | POA: Diagnosis not present

## 2016-07-08 DIAGNOSIS — B9561 Methicillin susceptible Staphylococcus aureus infection as the cause of diseases classified elsewhere: Secondary | ICD-10-CM | POA: Diagnosis not present

## 2016-07-11 DIAGNOSIS — J449 Chronic obstructive pulmonary disease, unspecified: Secondary | ICD-10-CM | POA: Diagnosis not present

## 2016-07-11 DIAGNOSIS — R7881 Bacteremia: Secondary | ICD-10-CM | POA: Diagnosis not present

## 2016-07-11 DIAGNOSIS — E1142 Type 2 diabetes mellitus with diabetic polyneuropathy: Secondary | ICD-10-CM | POA: Diagnosis not present

## 2016-07-11 DIAGNOSIS — I872 Venous insufficiency (chronic) (peripheral): Secondary | ICD-10-CM | POA: Diagnosis not present

## 2016-07-11 DIAGNOSIS — M86171 Other acute osteomyelitis, right ankle and foot: Secondary | ICD-10-CM | POA: Diagnosis not present

## 2016-07-11 DIAGNOSIS — B9561 Methicillin susceptible Staphylococcus aureus infection as the cause of diseases classified elsewhere: Secondary | ICD-10-CM | POA: Diagnosis not present

## 2016-07-11 DIAGNOSIS — I340 Nonrheumatic mitral (valve) insufficiency: Secondary | ICD-10-CM | POA: Diagnosis not present

## 2016-07-11 DIAGNOSIS — I472 Ventricular tachycardia: Secondary | ICD-10-CM | POA: Diagnosis not present

## 2016-07-11 DIAGNOSIS — J849 Interstitial pulmonary disease, unspecified: Secondary | ICD-10-CM | POA: Diagnosis not present

## 2016-07-12 ENCOUNTER — Other Ambulatory Visit: Payer: Self-pay | Admitting: *Deleted

## 2016-07-12 MED ORDER — METOPROLOL TARTRATE 50 MG PO TABS: 50.0000 mg | ORAL_TABLET | Freq: Two times a day (BID) | ORAL | 11 refills | Status: DC

## 2016-07-13 ENCOUNTER — Other Ambulatory Visit: Payer: Self-pay | Admitting: *Deleted

## 2016-07-13 ENCOUNTER — Encounter: Payer: Self-pay | Admitting: Infectious Diseases

## 2016-07-13 ENCOUNTER — Other Ambulatory Visit: Payer: Self-pay | Admitting: Internal Medicine

## 2016-07-13 ENCOUNTER — Other Ambulatory Visit (HOSPITAL_COMMUNITY)
Admission: RE | Admit: 2016-07-13 | Discharge: 2016-07-13 | Disposition: A | Discharge status: Home / Self Care | Payer: Medicare Other | Source: Ambulatory Visit | Attending: Infectious Diseases | Admitting: Infectious Diseases

## 2016-07-13 ENCOUNTER — Ambulatory Visit (INDEPENDENT_AMBULATORY_CARE_PROVIDER_SITE_OTHER): Payer: Medicare Other | Admitting: Infectious Diseases

## 2016-07-13 VITALS — BP 110/63 | HR 102 | Temp 97.9°F | Wt 216.0 lb

## 2016-07-13 DIAGNOSIS — F17200 Nicotine dependence, unspecified, uncomplicated: Secondary | ICD-10-CM

## 2016-07-13 DIAGNOSIS — B20 Human immunodeficiency virus [HIV] disease: Secondary | ICD-10-CM

## 2016-07-13 DIAGNOSIS — Z79899 Other long term (current) drug therapy: Secondary | ICD-10-CM

## 2016-07-13 DIAGNOSIS — I10 Essential (primary) hypertension: Secondary | ICD-10-CM

## 2016-07-13 DIAGNOSIS — Z113 Encounter for screening for infections with a predominantly sexual mode of transmission: Secondary | ICD-10-CM | POA: Diagnosis present

## 2016-07-13 DIAGNOSIS — G589 Mononeuropathy, unspecified: Secondary | ICD-10-CM

## 2016-07-13 DIAGNOSIS — R7881 Bacteremia: Secondary | ICD-10-CM

## 2016-07-13 DIAGNOSIS — G629 Polyneuropathy, unspecified: Secondary | ICD-10-CM

## 2016-07-13 DIAGNOSIS — IMO0002 Reserved for concepts with insufficient information to code with codable children: Secondary | ICD-10-CM

## 2016-07-13 DIAGNOSIS — Z89431 Acquired absence of right foot: Secondary | ICD-10-CM

## 2016-07-13 LAB — CBC
HCT: 40.1 % (ref 38.5–50.0)
HEMOGLOBIN: 13.3 g/dL (ref 13.2–17.1)
MCH: 28.5 pg (ref 27.0–33.0)
MCHC: 33.2 g/dL (ref 32.0–36.0)
MCV: 85.9 fL (ref 80.0–100.0)
MPV: 9.2 fL (ref 7.5–12.5)
PLATELETS: 190 10*3/uL (ref 140–400)
RBC: 4.67 MIL/uL (ref 4.20–5.80)
RDW: 15.4 % — AB (ref 11.0–15.0)
WBC: 4.9 10*3/uL (ref 3.8–10.8)

## 2016-07-13 MED ORDER — MORPHINE SULFATE ER 40 MG PO CP24: 40.0000 mg | ORAL_CAPSULE | Freq: Two times a day (BID) | ORAL | 0 refills | Status: DC

## 2016-07-13 NOTE — Assessment & Plan Note (Signed)
Has smoked 1 cigarette since d/c from hospital . I encouraged him to quit.

## 2016-07-13 NOTE — Progress Notes (Signed)
   Subjective:    Patient ID: Johnathan LarsenGary W [name], male    DOB: 1960-10-05, 56 y.o.   MRN: [id number]  HPI 56yo M with neuropathy, HIV/AIDS, DM that is diet controlled, suspected Marfan's syndrome, and COPD. He was started on complera 2012. Then changed to DTGV/Descovy 06-2015. Was in hospital 09-2013 with sustained VT. His cath was normal.  His last Echo was 06-06-2016:  - Left ventricle: The cavity size was mildly dilated. Systolic   function was normal. The estimated ejection fraction was in the   range of 50% to 55%. Akinesis of the basal-midinferior   myocardium. - Mitral valve: The findings are consistent with mild stenosis.   There was mild to moderate regurgitation. - Left atrium: The atrium was mildly to moderately dilated  I saw him in clinic 04-2016 and he was doing well with his ART. He was being followed at that time for a R foot ulcer. He has been on po bactrim for this.  He had a MRI done on 11-19 which showed:  1. Soft tissue ulcer overlying the plantar aspect of the fifth MTP joint. Mild osteomyelitis of the plantar aspect of the fifth metatarsal head. No focal fluid collection to suggest an abscess. He came to ED on 12-2 with erythema and warmth in his R ankle as well as fevers. He had ultrasound done which did not show DVT.  His BCx from adm were MSSA. His TEE was negative. He underwent R 5th ray amputation. He was d/c home on ancef to end on 12-18.   Has been doing well. Getting "around" ok.  Has lots of worries about family members financials.   HIV 1 RNA Quant (copies/mL)  Date Value  06/05/2016 <20  06/04/2016 30  02/12/2016 <20   CD4 T Cell Abs (/uL)  Date Value  06/05/2016 40 (L)  02/12/2016 120 (L)  08/03/2015 70 (L)   Stopped taking oil of oregano tablets.   Review of Systems  Constitutional: Negative for appetite change and unexpected weight change.  Gastrointestinal: Negative for blood in stool and constipation.  Genitourinary: Negative for difficulty  urinating.   Foot is mostly healed.     Objective:   Physical Exam  Constitutional: He appears well-developed.  HENT:  Mouth/Throat: No oropharyngeal exudate.  Eyes: Pupils are equal, round, and reactive to light.  Neck: Neck supple.  Cardiovascular: Normal rate, regular rhythm and normal heart sounds.   Pulmonary/Chest: Effort normal and breath sounds normal.  Abdominal: Soft. Bowel sounds are normal. There is no tenderness. There is no rebound.  Musculoskeletal:       Feet:  Lymphadenopathy:    He has no cervical adenopathy.      Assessment & Plan:

## 2016-07-13 NOTE — Telephone Encounter (Signed)
error 

## 2016-07-13 NOTE — Assessment & Plan Note (Signed)
He appears to be doing well Will repeat his labs today to see if his CD4 has improved.  Will see him back in 3-4 months.

## 2016-07-13 NOTE — Assessment & Plan Note (Signed)
Will repeat his BCx today 

## 2016-07-13 NOTE — Assessment & Plan Note (Signed)
Will refill his pain meds.  Will check his UDS Indication for chronic opioid: neuropathy Medication and dose: morphine 40mg  bid # pills per month: 60 Last UDS date: 07-13-16 Pain contract signed (Y/N): Y Date narcotic database last reviewed (include red flags):  07-13-16, none

## 2016-07-13 NOTE — Assessment & Plan Note (Signed)
Wound is healing well.  Appreciate Dr Audrie Liauda's help.

## 2016-07-14 LAB — COMPREHENSIVE METABOLIC PANEL
ALT: 10 U/L (ref 9–46)
AST: 11 U/L (ref 10–35)
Albumin: 4.1 g/dL (ref 3.6–5.1)
Alkaline Phosphatase: 113 U/L (ref 40–115)
BILIRUBIN TOTAL: 0.4 mg/dL (ref 0.2–1.2)
BUN: 15 mg/dL (ref 7–25)
CO2: 26 mmol/L (ref 20–31)
CREATININE: 1.09 mg/dL (ref 0.70–1.33)
Calcium: 9.7 mg/dL (ref 8.6–10.3)
Chloride: 101 mmol/L (ref 98–110)
GLUCOSE: 96 mg/dL (ref 65–99)
Potassium: 4.3 mmol/L (ref 3.5–5.3)
SODIUM: 136 mmol/L (ref 135–146)
Total Protein: 7.9 g/dL (ref 6.1–8.1)

## 2016-07-14 LAB — LIPID PANEL
CHOL/HDL RATIO: 4.1 ratio (ref <5.0)
Cholesterol: 172 mg/dL (ref <200)
HDL: 42 mg/dL (ref >40)
LDL CALC: 111 mg/dL — AB (ref <100)
Triglycerides: 95 mg/dL (ref <150)
VLDL: 19 mg/dL (ref <30)

## 2016-07-14 LAB — RPR

## 2016-07-15 DIAGNOSIS — J849 Interstitial pulmonary disease, unspecified: Secondary | ICD-10-CM | POA: Diagnosis not present

## 2016-07-15 DIAGNOSIS — I340 Nonrheumatic mitral (valve) insufficiency: Secondary | ICD-10-CM | POA: Diagnosis not present

## 2016-07-15 DIAGNOSIS — B9561 Methicillin susceptible Staphylococcus aureus infection as the cause of diseases classified elsewhere: Secondary | ICD-10-CM | POA: Diagnosis not present

## 2016-07-15 DIAGNOSIS — I472 Ventricular tachycardia: Secondary | ICD-10-CM | POA: Diagnosis not present

## 2016-07-15 DIAGNOSIS — E1142 Type 2 diabetes mellitus with diabetic polyneuropathy: Secondary | ICD-10-CM | POA: Diagnosis not present

## 2016-07-15 DIAGNOSIS — J449 Chronic obstructive pulmonary disease, unspecified: Secondary | ICD-10-CM | POA: Diagnosis not present

## 2016-07-15 DIAGNOSIS — M86171 Other acute osteomyelitis, right ankle and foot: Secondary | ICD-10-CM | POA: Diagnosis not present

## 2016-07-15 DIAGNOSIS — R7881 Bacteremia: Secondary | ICD-10-CM | POA: Diagnosis not present

## 2016-07-15 DIAGNOSIS — I872 Venous insufficiency (chronic) (peripheral): Secondary | ICD-10-CM | POA: Diagnosis not present

## 2016-07-15 LAB — HIV-1 RNA QUANT-NO REFLEX-BLD
HIV 1 RNA Quant: <20 copies/mL (ref <20)
HIV-1 RNA Quant, Log: <1.3 Log copies/mL (ref <1.30)

## 2016-07-15 LAB — T-HELPER CELL (CD4) - (RCID CLINIC ONLY)
CD4 % Helper T Cell: 11 % — ABNORMAL LOW (ref 33–55)
CD4 T Cell Abs: 140 /uL — ABNORMAL LOW (ref 400–2700)

## 2016-07-15 LAB — URINE CYTOLOGY ANCILLARY ONLY
CHLAMYDIA, DNA PROBE: NEGATIVE
Neisseria Gonorrhea: NEGATIVE

## 2016-07-18 DIAGNOSIS — E1142 Type 2 diabetes mellitus with diabetic polyneuropathy: Secondary | ICD-10-CM | POA: Diagnosis not present

## 2016-07-18 DIAGNOSIS — J849 Interstitial pulmonary disease, unspecified: Secondary | ICD-10-CM | POA: Diagnosis not present

## 2016-07-18 DIAGNOSIS — B9561 Methicillin susceptible Staphylococcus aureus infection as the cause of diseases classified elsewhere: Secondary | ICD-10-CM | POA: Diagnosis not present

## 2016-07-18 DIAGNOSIS — J449 Chronic obstructive pulmonary disease, unspecified: Secondary | ICD-10-CM | POA: Diagnosis not present

## 2016-07-18 DIAGNOSIS — M86171 Other acute osteomyelitis, right ankle and foot: Secondary | ICD-10-CM | POA: Diagnosis not present

## 2016-07-18 DIAGNOSIS — I472 Ventricular tachycardia: Secondary | ICD-10-CM | POA: Diagnosis not present

## 2016-07-18 DIAGNOSIS — R7881 Bacteremia: Secondary | ICD-10-CM | POA: Diagnosis not present

## 2016-07-18 DIAGNOSIS — I340 Nonrheumatic mitral (valve) insufficiency: Secondary | ICD-10-CM | POA: Diagnosis not present

## 2016-07-18 DIAGNOSIS — I872 Venous insufficiency (chronic) (peripheral): Secondary | ICD-10-CM | POA: Diagnosis not present

## 2016-07-19 ENCOUNTER — Ambulatory Visit (INDEPENDENT_AMBULATORY_CARE_PROVIDER_SITE_OTHER): Payer: Medicare Other | Admitting: Family

## 2016-07-19 ENCOUNTER — Encounter (INDEPENDENT_AMBULATORY_CARE_PROVIDER_SITE_OTHER): Payer: Self-pay | Admitting: Family

## 2016-07-19 VITALS — Ht 74.0 in | Wt 216.0 lb

## 2016-07-19 DIAGNOSIS — M205X1 Other deformities of toe(s) (acquired), right foot: Secondary | ICD-10-CM

## 2016-07-19 DIAGNOSIS — I872 Venous insufficiency (chronic) (peripheral): Secondary | ICD-10-CM

## 2016-07-19 DIAGNOSIS — IMO0002 Reserved for concepts with insufficient information to code with codable children: Secondary | ICD-10-CM

## 2016-07-19 DIAGNOSIS — Z89431 Acquired absence of right foot: Secondary | ICD-10-CM

## 2016-07-19 LAB — CULTURE, BLOOD (SINGLE)
ORGANISM ID, BACTERIA: NO GROWTH
Organism ID, Bacteria: NO GROWTH

## 2016-07-19 NOTE — Progress Notes (Signed)
Office Visit Note   Patient: [name]           Date of Birth: [birth year]           MRN: [id number] Visit Date: 07/19/2016              Requested by: Ginnie Smart, MD [address] [place], [place] [address] PCP: Johny Sax, MD  Chief Complaint  Patient presents with  . Right Foot - Routine Post Op    5th ray amputation 06/06/16. 43 days post op.    HPI: Patient presents for right foot 5th ray amputation. He is doing well overall. He is taking pain medication from medical doctor and was taking oxycodone for breakthrough pain. He has mild redness. His incision is well healed he has minimal swelling. He has discontinued darco shoe yesterday. [facility] home [facility] nursing has one more additional appointment on Friday. He does have a wound dorsal 4th toe.   Patient is a 56 year old gentleman who is seen today for evaluation of right foot fifth ray amputation. This is well-healed.  is concerned about a ulcer over his fourth toe on the right foot. Has a little redness no open areas. Him requesting a extra-depth shoe. [facility] health with [facility] has been continuing to do wound care; recommended extra-depth shoes.    Assessment & Plan: Visit Diagnoses:  1. Foot amputation status, right (HCC)   2. Venous stasis dermatitis of right lower extremity   3. Claw toe, acquired, right     Plan: Continue dry dressing over the small open area. Cleanse this daily. May resume weightbearing as tolerated. Have provided an order today for a carbon fiber plate with some orthotic as well as extra-depth shoes. Follow up with [place] in the office in 4 weeks.  Follow-Up Instructions: No Follow-up on file.   Ortho Exam  Right lower extremity with edema and brawny skin color changes, dry flaking skin. Right foot: The fifth ray amputation is healing well there is one 5 mm length of the incision that has yet to heal. This area has no depth no drainage no surrounding erythema or sign of infection.  There is also an impending ulceration over the PIP joint of the fourth toe dorsally. This toe is clawed. There is no drainage erythema or sign of infection.  Imaging: No results found.  Orders:  No orders of the defined types were placed in this encounter.  No orders of the defined types were placed in this encounter.    Procedures: No procedures performed  Clinical Data: No additional findings.  Subjective: Review of Systems  Constitutional: Negative for chills and fever.  Cardiovascular: Positive for leg swelling.  Skin: Positive for color change and wound.    Objective: Vital Signs: Ht 6\' 2"  (1.88 m)   Wt 216 lb (98 kg)   BMI 27.73 kg/m   Specialty Comments:  No specialty comments available.  PMFS History: Patient Active Problem List   Diagnosis Date Noted  . Venous stasis dermatitis of right lower extremity 07/19/2016  . Claw toe, acquired, right 07/19/2016  . Foot amputation status, right (HCC) 06/16/2016  . Bacteremia due to Staphylococcus aureus 06/07/2016  . Malnutrition of moderate degree 06/06/2016  . Osteomyelitis of foot, right, acute (HCC) 06/05/2016  . Foot ulcer, right (HCC)   . Pyogenic inflammation of bone (HCC) 06/04/2016  . Cellulitis 06/04/2016  . AIDS (HCC) 06/09/2015  . 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  GERD (gastroesophageal reflux disease) 06/09/2015  . COPD exacerbation (HCC) 12/08/2014  . Folliculitis barbae 12/08/2014  . Rhinosinusitis 12/08/2014  . ILD (interstitial lung disease) (HCC) 12/08/2014  . Neuropathic foot ulcer (HCC) 10/29/2014  . PTSD (post-traumatic stress disorder) 09/23/2013  . Bilateral lower extremity edema- hx venous RFA 9/14 09/23/2013  . VT (ventricular tachycardia) (HCC) 09/23/2013  . Falls 06/12/2013  . Reflux 05/07/2012  . Mental status change 02/02/2011  . TOBACCO ABUSE 01/25/2010  . Varicose veins of lower extremities with ulcer (HCC) 08/11/2009  . DERMATITIS NOS  01/21/2008  . Chest pressure - when in VT 12/18/2007  . DIARRHEA 11/22/2007  . Type 2 diabetes, controlled, with peripheral neuropathy (HCC) 04/13/2006  . Depression 04/13/2006  . Mononeuritis 04/13/2006  . COPD with acute bronchitis (HCC) 04/13/2006  . 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 HEPATITIS B, HX OF 04/13/2006   Past Medical History:  Diagnosis Date  . Cholesteatoma   . 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 COPD (chronic obstructive pulmonary disease) (HCC)   . Depression   . Folliculitis barbae 12/08/2014  . GERD (gastroesophageal reflux disease) 06/09/2015  . HIV (human immunodeficiency virus infection) (HCC)   . ILD (interstitial lung disease) (HCC) 12/08/2014  . Marfan syndrome    Dr Ninetta LightsHatcher follows  . Mitral valve prolapse   . Rhinosinusitis 12/08/2014  . Venous insufficiency (chronic) (peripheral)   . VT (ventricular tachycardia) (HCC)     Family History  Problem Relation Age of Onset  . Cancer Mother   . Hypertension Mother   . Stroke Sister   . Hypertension Sister   . Aneurysm Sister     Past Surgical History:  Procedure Laterality Date  . AMPUTATION Right 06/06/2016   Procedure: AMPUTATION RAY RIGHT FIFTH RAY;  Surgeon: Nadara MustardMarcus V Duda, MD;  Location: [facility];  Service: Orthopedics;  Laterality: Right;  . APPENDECTOMY    . cholesteotoma removal    . LEFT HEART CATHETERIZATION WITH CORONARY ANGIOGRAM [name] 09/24/2013   Procedure: LEFT HEART CATHETERIZATION WITH CORONARY ANGIOGRAM;  Surgeon: Marykay Lexavid W Harding, MD;  [facility];  Service: Cardiovascular;  Laterality: [name];  . repair of perforated colon    . TEE WITHOUT CARDIOVERSION [name] 06/09/2016   Procedure: TRANSESOPHAGEAL ECHOCARDIOGRAM (TEE);  Surgeon: Wendall StadePeter C Nishan, MD;  [facility];  Service: Cardiovascular;  Laterality: [name];   Social History   Occupational History  . Not on file.   Social History Main Topics  .  Smoking status: Current Some Day Smoker    Packs/day: 0.25    Years: 35.00    Types: Cigarettes  . Smokeless tobacco: Never Used     Comment: trying to cut back  . Alcohol use No  . Drug use: No  . Sexual activity: No     Comment: pt. declined condoms

## 2016-07-22 DIAGNOSIS — I340 Nonrheumatic mitral (valve) insufficiency: Secondary | ICD-10-CM | POA: Diagnosis not present

## 2016-07-22 DIAGNOSIS — E1142 Type 2 diabetes mellitus with diabetic polyneuropathy: Secondary | ICD-10-CM | POA: Diagnosis not present

## 2016-07-22 DIAGNOSIS — I872 Venous insufficiency (chronic) (peripheral): Secondary | ICD-10-CM | POA: Diagnosis not present

## 2016-07-22 DIAGNOSIS — I472 Ventricular tachycardia: Secondary | ICD-10-CM | POA: Diagnosis not present

## 2016-07-22 DIAGNOSIS — J849 Interstitial pulmonary disease, unspecified: Secondary | ICD-10-CM | POA: Diagnosis not present

## 2016-07-22 DIAGNOSIS — R7881 Bacteremia: Secondary | ICD-10-CM | POA: Diagnosis not present

## 2016-07-22 DIAGNOSIS — M86171 Other acute osteomyelitis, right ankle and foot: Secondary | ICD-10-CM | POA: Diagnosis not present

## 2016-07-22 DIAGNOSIS — B9561 Methicillin susceptible Staphylococcus aureus infection as the cause of diseases classified elsewhere: Secondary | ICD-10-CM | POA: Diagnosis not present

## 2016-07-22 DIAGNOSIS — J449 Chronic obstructive pulmonary disease, unspecified: Secondary | ICD-10-CM | POA: Diagnosis not present

## 2016-08-03 ENCOUNTER — Encounter: Payer: Self-pay | Admitting: Podiatry

## 2016-08-03 ENCOUNTER — Ambulatory Visit (INDEPENDENT_AMBULATORY_CARE_PROVIDER_SITE_OTHER): Payer: Medicare Other | Admitting: Podiatry

## 2016-08-03 VITALS — BP 113/72 | HR 66 | Temp 96.3°F | Resp 18

## 2016-08-03 DIAGNOSIS — L02611 Cutaneous abscess of right foot: Secondary | ICD-10-CM | POA: Diagnosis not present

## 2016-08-03 DIAGNOSIS — L03031 Cellulitis of right toe: Secondary | ICD-10-CM

## 2016-08-03 MED ORDER — SULFAMETHOXAZOLE-TRIMETHOPRIM 800-160 MG PO TABS: 1.0000 | ORAL_TABLET | Freq: Two times a day (BID) | ORAL | 1 refills | Status: DC

## 2016-08-03 NOTE — Patient Instructions (Signed)
Today your exam demonstrated a local redness in the fourth right toe suggested of the low-grade infection, cellulitis Begin taking Bactrim DS one twice a day 7 days Wear the surgical shoe on the right foot to reduce friction and rub You develop any sudden increase in pain, swelling, redness, fever present to the emergency department Will refer you to [employer] for extra-depth shoes with insoles to accommodate the fifth ray amputation on the right foot

## 2016-08-03 NOTE — Progress Notes (Signed)
   Subjective:    Patient ID: Johnathan LarsenGary W Scadden, male    DOB: [birth year], 56 y.o.   MRN: [id number]  HPI     Patient presents today requesting extra-depth shoes and insoles to accommodate fifth right ray amputation on 06/05/2016. He has recovered from the procedure.. His orthopedic surgeon who did the resection referred him to [facility] who informed patient they do not provide services for this problem. He is unaware of any other foot problems and presents for evaluation request replacement shoes. He is noticed a black dot on the fourth right toe for approximately 1 week.  Review of Systems  All other systems reviewed and are negative.      Objective:   Physical Exam   BP of 113/72 Pulse 66 Respiration 18 Temperature 96.3 Fahrenheit  Orientated 3  Vascular: DP and PT pulses 2/4 bilaterally Capillary reflex immediate bilaterally  Neurological: Sensation to 10 g monofilament wire intact 0/5 bilaterally Vibratory sensation nonreactive bilaterally Ankle reflex reactive bilaterally  Dermatological: Hyperpigmentation lower extremities bilaterally Dried blood dorsal fourth right toe Local erythema fourth right toe without open lesion  Musculoskeletal: Fifth ray amputation right with well-healed surgical incision.    Assessment & Plan:   Assessment: Peripheral neuropathy Cellulitis fourth right toe Well-healed surgical incision from fifth right ray amputation  Plan: Informed patient that the fourth right toe has an infection/cellulitis Rx Bactrim DS by mouth twice a day 7 days one refill Wear existing surgical shoe on right foot Instructed patient if he notices any sudden increase in pain, swelling, redness, fever to present to ED  Rx extra-depth shoes with, dated insoles to accommodate fifth right ray amputation with associated peripheral neuropathy/ [employer] given to patient today Reappoint 7 days

## 2016-08-09 ENCOUNTER — Ambulatory Visit (INDEPENDENT_AMBULATORY_CARE_PROVIDER_SITE_OTHER): Payer: 59 | Admitting: Podiatry

## 2016-08-09 ENCOUNTER — Encounter: Payer: Self-pay | Admitting: Podiatry

## 2016-08-09 VITALS — BP 117/73 | HR 81 | Temp 97.6°F | Resp 16

## 2016-08-09 DIAGNOSIS — E1142 Type 2 diabetes mellitus with diabetic polyneuropathy: Secondary | ICD-10-CM | POA: Diagnosis not present

## 2016-08-09 DIAGNOSIS — L02611 Cutaneous abscess of right foot: Secondary | ICD-10-CM | POA: Diagnosis not present

## 2016-08-09 DIAGNOSIS — L03031 Cellulitis of right toe: Secondary | ICD-10-CM | POA: Diagnosis not present

## 2016-08-09 MED ORDER — SULFAMETHOXAZOLE-TRIMETHOPRIM 800-160 MG PO TABS: 1.0000 | ORAL_TABLET | Freq: Two times a day (BID) | ORAL | 1 refills | Status: DC

## 2016-08-09 NOTE — Progress Notes (Signed)
   Subjective:    Patient ID: [name], male    DOB: 01/15/1961, 56 y.o.   MRN: [id number]  HPI This patient presents for follow-up visit of 08/03/2016. Patient states that he is completed 7 days of Bactrim DS without a complaint from medication. He states that the fourth right toe appears to look and feel better to him from the initial visit. At that time he was instructed to wear the surgical shoe and complete Bactrim DS by mouth twice a day 7 days   Review of Systems  All other systems reviewed and are negative.      Objective:   Physical Exam     Orientated 3  Vascular: DP and PT pulses 2/4 bilaterally Capillary reflex immediate bilaterally  Neurological: Sensation to 10 g monofilament wire intact 0/5 bilaterally Vibratory sensation nonreactive bilaterally Ankle reflex reactive bilaterally  Dermatological: Hyperpigmentation lower extremities bilaterally Dried blood dorsal fourth right toe Local erythema fourth right toe without open lesion. (The erythema and edema have is from baseline visit of 08/03/2016) There is no ascending erythema, edema proximally from the fourth right toe  Musculoskeletal: Fifth ray amputation right with well-healed surgical incision       Assessment & Plan:   Assessment: Reducing cellulitis fourth right toe Peripheral neuropathy History of type 2 diabetes patient is not wearing existing surgical shoe on the right foot   Plan: Refill Bactrim DS by mouth twice a day 7 days Apply Silvadene and gauze dressing to the fourth right toe Wear the surgical shoe on the right foot If you develop any sudden increase in pain, swelling, redness, fever present to ED  Patient states that he lost the prescription for extra-depth shoes with insoles to [employer] given to patient on the visit of 08/03/2016  Rx extra-depth shoes with, dated insoles to accommodate fifth right ray amputation with associated peripheral neuropathy/ [employer] given to  patient today Reappoint 7 days  Reappoint 7 days

## 2016-08-09 NOTE — Patient Instructions (Signed)
Wear the surgical shoe on the right foot all times Continue Bactrim DS 1 tablet twice a day 7 days Apply small gauze dressing with Silvadene to the fourth right toe daily You notice any sudden increase in pain, swelling, redness, fever presents emergency room   Diabetes and Foot Care Diabetes may cause you to have problems because of poor blood supply (circulation) to your feet and legs. This may cause the skin on your feet to become thinner, break easier, and heal more slowly. Your skin may become dry, and the skin may peel and crack. You may also have nerve damage in your legs and feet causing decreased feeling in them. You may not notice minor injuries to your feet that could lead to infections or more serious problems. Taking care of your feet is one of the most important things you can do for yourself. Follow these instructions at home:  Wear shoes at all times, even in the house. Do not go barefoot. Bare feet are easily injured.  Check your feet daily for blisters, cuts, and redness. If you cannot see the bottom of your feet, use a mirror or ask someone for help.  Wash your feet with warm water (do not use hot water) and mild soap. Then pat your feet and the areas between your toes until they are completely dry. Do not soak your feet as this can dry your skin.  Apply a moisturizing lotion or petroleum jelly (that does not contain alcohol and is unscented) to the skin on your feet and to dry, brittle toenails. Do not apply lotion between your toes.  Trim your toenails straight across. Do not dig under them or around the cuticle. File the edges of your nails with an emery board or nail file.  Do not cut corns or calluses or try to remove them with medicine.  Wear clean socks or stockings every day. Make sure they are not too tight. Do not wear knee-high stockings since they may decrease blood flow to your legs.  Wear shoes that fit properly and have enough cushioning. To break in new  shoes, wear them for just a few hours a day. This prevents you from injuring your feet. Always look in your shoes before you put them on to be sure there are no objects inside.  Do not cross your legs. This may decrease the blood flow to your feet.  If you find a minor scrape, cut, or break in the skin on your feet, keep it and the skin around it clean and dry. These areas may be cleansed with mild soap and water. Do not cleanse the area with peroxide, alcohol, or iodine.  When you remove an adhesive bandage, be sure not to damage the skin around it.  If you have a wound, look at it several times a day to make sure it is healing.  Do not use heating pads or hot water bottles. They may burn your skin. If you have lost feeling in your feet or legs, you may not know it is happening until it is too late.  Make sure your health care provider performs a complete foot exam at least annually or more often if you have foot problems. Report any cuts, sores, or bruises to your health care provider immediately. Contact a health care provider if:  You have an injury that is not healing.  You have cuts or breaks in the skin.  You have an ingrown nail.  You notice redness on  your legs or feet.  You feel burning or tingling in your legs or feet.  You have pain or cramps in your legs and feet.  Your legs or feet are numb.  Your feet always feel cold. Get help right away if:  There is increasing redness, swelling, or pain in or around a wound.  There is a red line that goes up your leg.  Pus is coming from a wound.  You develop a fever or as directed by your health care provider.  You notice a bad smell coming from an ulcer or wound. This information is not intended to replace advice given to you by your health care provider. Make sure you discuss any questions you have with your health care provider. Document Released: 06/17/2000 Document Revised: 11/26/2015 Document Reviewed:  11/27/2012 Elsevier Interactive Patient Education  2017 [employer].

## 2016-08-11 ENCOUNTER — Other Ambulatory Visit: Payer: Self-pay | Admitting: *Deleted

## 2016-08-11 DIAGNOSIS — G629 Polyneuropathy, unspecified: Secondary | ICD-10-CM

## 2016-08-11 MED ORDER — MORPHINE SULFATE ER 40 MG PO CP24: 40.0000 mg | ORAL_CAPSULE | Freq: Two times a day (BID) | ORAL | 0 refills | Status: DC

## 2016-08-16 ENCOUNTER — Ambulatory Visit: Payer: 59 | Admitting: Podiatry

## 2016-08-24 NOTE — Progress Notes (Deleted)
Cardiology Office Note   Date:  08/24/2016   ID:  Armstead, Heiland 04/08/1961, MRN [id number]  PCP:  Johny Sax, MD  Cardiologist:   Charlton Haws, MD   No chief complaint on file.     History of Present Illness: [name] is a 56 y.o. male who presents for post hospital. Primarily has seen Dr Ladona Ridgel for VT in past I initially saw him in consult 06/06/16.  Hospitalized for infected right foot needing amputation.  In regard to his VT Rx with beta blocker. Cath 09/2013 no CAD MRI no scar on post gad images. History of MR but not bad In hospital loud murmur I did TEE on him   12.7.17    EF 60-65%  P3 scallop prolapse posterior leaflet  Moderate MR no SBE   Seen by EP 06/30/16   Tolerating lopressor 50 bid   Past Medical History:  Diagnosis Date  . Cholesteatoma   . 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 COPD (chronic obstructive pulmonary disease) (HCC)   . Depression   . Folliculitis barbae 12/08/2014  . GERD (gastroesophageal reflux disease) 06/09/2015  . HIV (human immunodeficiency virus infection) (HCC)   . ILD (interstitial lung disease) (HCC) 12/08/2014  . Marfan syndrome    Dr Ninetta Lights follows  . Mitral valve prolapse   . Rhinosinusitis 12/08/2014  . Venous insufficiency (chronic) (peripheral)   . VT (ventricular tachycardia) (HCC)     Past Surgical History:  Procedure Laterality Date  . AMPUTATION Right 06/06/2016   Procedure: AMPUTATION RAY RIGHT FIFTH RAY;  Surgeon: Nadara Mustard, MD;  Location: [facility];  Service: Orthopedics;  Laterality: Right;  . APPENDECTOMY    . cholesteotoma removal    . LEFT HEART CATHETERIZATION WITH CORONARY ANGIOGRAM [name] 09/24/2013   Procedure: LEFT HEART CATHETERIZATION WITH CORONARY ANGIOGRAM;  Surgeon: Marykay Lex, MD;  [facility] LAB;  Service: Cardiovascular;  Laterality: [name];  . repair of perforated colon    . TEE WITHOUT CARDIOVERSION [name] 06/09/2016   Procedure: TRANSESOPHAGEAL  ECHOCARDIOGRAM (TEE);  Surgeon: Wendall Stade, MD;  [facility]: [facility] ENDOSCOPY;  Service: Cardiovascular;  Laterality: [name];     Current Outpatient Prescriptions  Medication Sig Dispense Refill  . ALPRAZolam (XANAX) 0.5 MG tablet Take 0.5 mg by mouth 3 (three) times daily as needed for anxiety.    Marland Kitchen aspirin EC 81 MG tablet Take 81 mg by mouth daily.    . clonazePAM (KLONOPIN) 1 MG tablet Take 1 mg by mouth at bedtime.    . DESCOVY 200-25 MG tablet TAKE 1 TABLET BY MOUTH DAILY. 30 tablet 11  . furosemide (LASIX) 40 MG tablet TAKE 1/2 TABLET (20 MG TOTAL) BY BY MOUTH EVERY MORNING. 30 tablet 5  . metoprolol (LOPRESSOR) 50 MG tablet Take 1 tablet (50 mg total) by mouth 2 (two) times daily. 60 tablet 11  . Morphine Sulfate 40 MG CP24 Take 40 mg by mouth 2 (two) times daily. 60 capsule 0  . Multiple Vitamins-Minerals (MULTIVITAMIN WITH MINERALS) tablet Take 1 tablet by mouth daily.    . nortriptyline (PAMELOR) 75 MG capsule Take 150 mg by mouth at bedtime.    Marland Kitchen oxyCODONE-acetaminophen (PERCOCET/ROXICET) 5-325 MG tablet Take 1 tablet by mouth 2 (two) times daily as needed for severe pain. 28 tablet 0  . promethazine (PHENERGAN) 25 MG tablet TAKE 1 TABLET BY MOUTH EVERY 8 HOURS AS NEEDED. 30 tablet 4  . spironolactone (ALDACTONE) 25 MG  tablet TAKE 1 TABLET BY MOUTH ONCE DAILY. 30 tablet 5  . SSD 1 % cream APPLY TO AFFECTED AREA DAILY 50 g 0  . sulfamethoxazole-trimethoprim (BACTRIM DS) 800-160 MG tablet Take 1 tablet by mouth 2 (two) times daily. 14 tablet 1  . TIVICAY 50 MG tablet TAKE 1 TABLET BY MOUTH DAILY. 30 tablet 11   No current facility-administered medications for this visit.     Allergies:   Tetracycline hcl    Social History:  The patient  reports that he has been smoking Cigarettes.  He has a 8.75 pack-year smoking history. He has never used smokeless tobacco. He reports that he does not drink alcohol or use drugs.   Family History:  The patient's family history includes Aneurysm in  his sister; Cancer in his mother; Hypertension in his mother and sister; Stroke in his sister.    ROS:  Please see the history of present illness.   Otherwise, review of systems are positive for none.   All other systems are reviewed and negative.    PHYSICAL EXAM: VS:  There were no vitals taken for this visit. , BMI There is no height or weight on file to calculate BMI. Affect appropriate Healthy:  appears stated age HEENT: normal Neck supple with no adenopathy JVP normal no bruits no thyromegaly Lungs clear with no wheezing and good diaphragmatic motion Heart:  S1/S2 no murmur, no rub, gallop or click PMI normal Abdomen: benighn, BS positve, no tenderness, no AAA no bruit.  No HSM or HJR Distal pulses intact with no bruits No edema Neuro non-focal Skin warm and dry No muscular weakness    EKG:  06/11/16 IVCD SR rate 98    Recent Labs: 06/05/2016: TSH 0.903 06/07/2016: Magnesium 2.0 07/13/2016: ALT 10; BUN 15; Creat 1.09; Hemoglobin 13.3; Platelets 190; Potassium 4.3; Sodium 136    Lipid Panel    Component Value Date/Time   CHOL 172 07/13/2016 1608   TRIG 95 07/13/2016 1608   HDL 42 07/13/2016 1608   CHOLHDL 4.1 07/13/2016 1608   VLDL 19 07/13/2016 1608   LDLCALC 111 (H) 07/13/2016 1608      Wt Readings from Last 3 Encounters:  07/19/16 216 lb (98 kg)  07/13/16 216 lb (98 kg)  06/30/16 217 lb (98.4 kg)      Other studies Reviewed: Additional studies/ records that were reviewed today include: Notes from hospital December 2017 EP notes ECG Echo and TEE as well as cath from .2015 and MRI     ASSESSMENT AND PLAN:  1. VT: 2. MR 3. Osteomyelitis  4. HTN   Current medicines are reviewed at length with the patient today.  The patient does not have concerns regarding medicines.  The following changes have been made:  no change  Labs/ tests ordered today include: *** No orders of the defined types were placed in this encounter.    Disposition:   FU  with me in 6 months      Signed, Charlton HawsPeter Calyx Hawker, MD  08/24/2016 9:48 AM    [facility] [address], [place], [place]  [address] Phone: [phone]; Fax: [phone]

## 2016-09-05 ENCOUNTER — Ambulatory Visit: Payer: Medicare Other | Admitting: Cardiovascular Disease

## 2016-09-07 ENCOUNTER — Other Ambulatory Visit: Payer: Self-pay

## 2016-09-07 MED ORDER — OXYCODONE-ACETAMINOPHEN 5-325 MG PO TABS: 1.0000 | ORAL_TABLET | Freq: Two times a day (BID) | ORAL | 0 refills | Status: DC | PRN

## 2016-09-07 NOTE — Telephone Encounter (Signed)
Patient calling for refill of oxycodone- 5-325 .  Script printed for Dr. Ninetta LightsHatcher to sign.   Laurell Josephsammy K Cervando Durnin, RN

## 2016-09-08 ENCOUNTER — Ambulatory Visit: Payer: Medicare Other | Admitting: Cardiovascular Disease

## 2016-09-09 ENCOUNTER — Other Ambulatory Visit: Payer: Self-pay | Admitting: *Deleted

## 2016-09-09 DIAGNOSIS — G629 Polyneuropathy, unspecified: Secondary | ICD-10-CM

## 2016-09-09 MED ORDER — MORPHINE SULFATE ER 40 MG PO CP24: 40.0000 mg | ORAL_CAPSULE | Freq: Two times a day (BID) | ORAL | 0 refills | Status: DC

## 2016-09-14 ENCOUNTER — Encounter (INDEPENDENT_AMBULATORY_CARE_PROVIDER_SITE_OTHER): Payer: Self-pay

## 2016-09-14 ENCOUNTER — Encounter (INDEPENDENT_AMBULATORY_CARE_PROVIDER_SITE_OTHER): Payer: Self-pay | Admitting: Orthopedic Surgery

## 2016-09-14 ENCOUNTER — Ambulatory Visit (INDEPENDENT_AMBULATORY_CARE_PROVIDER_SITE_OTHER): Payer: 59 | Admitting: Orthopedic Surgery

## 2016-09-14 VITALS — Ht 74.0 in | Wt 216.0 lb

## 2016-09-14 DIAGNOSIS — E1142 Type 2 diabetes mellitus with diabetic polyneuropathy: Secondary | ICD-10-CM | POA: Diagnosis not present

## 2016-09-14 DIAGNOSIS — IMO0002 Reserved for concepts with insufficient information to code with codable children: Secondary | ICD-10-CM

## 2016-09-14 DIAGNOSIS — Z89431 Acquired absence of right foot: Secondary | ICD-10-CM | POA: Diagnosis not present

## 2016-09-14 MED ORDER — CLINDAMYCIN HCL 300 MG PO CAPS: 300.0000 mg | ORAL_CAPSULE | Freq: Four times a day (QID) | ORAL | 0 refills | Status: AC

## 2016-09-14 MED ORDER — CLINDAMYCIN HCL 300 MG PO CAPS: 300.0000 mg | ORAL_CAPSULE | Freq: Three times a day (TID) | ORAL | 0 refills | Status: DC

## 2016-09-14 NOTE — Progress Notes (Signed)
Office Visit Note   Patient: [name]           Date of Birth: [birth year]           MRN: [id number] Visit Date: 09/14/2016              Requested by: Ginnie SmartJeffrey C Hatcher, MD [address] [place], [place] [address] PCP: Johny SaxJeffrey Hatcher, MD  Chief Complaint  Patient presents with  . Right Foot - Routine Post Op    5th ray amputation 06/06/16.     HPI: Patient is a 56 year old gentleman who is s/p a right 5th ray amputation 06/06/16.  This is well healed. The pt is concerned about his 4th toe which is now red and swollen. He states that there was ablister that has since ruptured and there is now a dry scabbed area on the toe. States his left great toe is turning black and is thick and discolored and the other toes are a little swollen. He is full weightbearing and in regular shoe as he states [employer] will not take him as a patient for his custom orthotics and diabetic shoes.     Assessment & Plan: Visit Diagnoses:  1. Type 2 diabetes, controlled, with peripheral neuropathy (HCC)   2. Foot amputation status, right (HCC)     Plan: complete Clindamycin course. Antibacterial ointment dressing changes daily. Follow up in office in 4 weeks.   Could go to [facility] for fabrication of orthotics and ED shoes.   Follow-Up Instructions: Return in about 4 weeks (around 10/12/2016).   Physical Exam  Constitutional: Appears well-developed.  Head: Normocephalic.  Eyes: EOM are normal.  Neck: Normal range of motion.  Cardiovascular: Normal rate.   Pulmonary/Chest: Effort normal.  Neurological: Is alert.  Skin: Skin is warm.  Psychiatric: Has a normal mood and affect. Right foot: fifth ray amputation is well healed. Does have a 2mm in diameter ulcer over great toe dip. This is covered with eschar. No drainage. No erythema no sign of infection.  Left foot: 4th toe with ulceration this is 3 mm in diameter. Exudative tissue in wound bed. No drainage. No surrounding erythema. No odor. No  sausage digit swelling wound has no depth. Trace edema with erythema to foot. No cellulitis.  Ortho Exam  Imaging: No results found.  Labs: Lab Results  Component Value Date   HGBA1C 5.3 06/05/2016   HGBA1C 5.5 03/25/2009   ESRSEDRATE 96 (H) 06/04/2016   CRP 15.6 (H) 06/04/2016   REPTSTATUS 06/12/2016 FINAL 06/07/2016   REPTSTATUS 06/12/2016 FINAL 06/07/2016   GRAMSTAIN  06/06/2016    FEW WBC PRESENT,BOTH PMN AND MONONUCLEAR NO ORGANISMS SEEN    CULT  06/07/2016    [facility] GROWTH 5 [facility]    CULT  06/07/2016    NO GROWTH 5 DAYS [facility]    LABORGA NO GROWTH 5 DAYS 07/13/2016    Orders:  No orders of the defined types were placed in this encounter.  Meds ordered this encounter  Medications  . DISCONTD: clindamycin (CLEOCIN) 300 MG capsule    Sig: Take 1 capsule (300 mg total) by mouth 3 (three) times daily.    Dispense:  40 capsule    Refill:  0  . clindamycin (CLEOCIN) 300 MG capsule    Sig: Take 1 capsule (300 mg total) by mouth 4 (four) times daily.    Dispense:  56 capsule    Refill:  0     Procedures: No procedures performed  Clinical Data: No additional findings.  Subjective: Review of Systems  Constitutional: Negative for chills and fever.    Objective: Vital Signs: Ht 6\' 2"  (1.88 m)   Wt 216 lb (98 kg)   BMI 27.73 kg/m   Specialty Comments:  No specialty comments available.  PMFS History: Patient Active Problem List   Diagnosis Date Noted  . Venous stasis dermatitis of right lower extremity 07/19/2016  . Claw toe, acquired, right 07/19/2016  . Foot amputation status, right (HCC) 06/16/2016  . Bacteremia due to Staphylococcus aureus 06/07/2016  . Malnutrition of moderate degree 06/06/2016  . Pyogenic inflammation of bone (HCC) 06/04/2016  . Cellulitis 06/04/2016  . AIDS (HCC) 06/09/2015  . 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 GERD  (gastroesophageal reflux disease) 06/09/2015  . COPD exacerbation (HCC) 12/08/2014  . Folliculitis barbae 12/08/2014  . Rhinosinusitis 12/08/2014  . ILD (interstitial lung disease) (HCC) 12/08/2014  . Neuropathic foot ulcer (HCC) 10/29/2014  . PTSD (post-traumatic stress disorder) 09/23/2013  . Bilateral lower extremity edema- hx venous RFA 9/14 09/23/2013  . VT (ventricular tachycardia) (HCC) 09/23/2013  . Falls 06/12/2013  . Reflux 05/07/2012  . Mental status change 02/02/2011  . TOBACCO ABUSE 01/25/2010  . Varicose veins of lower extremities with ulcer (HCC) 08/11/2009  . DERMATITIS NOS 01/21/2008  . Chest pressure - when in VT 12/18/2007  . DIARRHEA 11/22/2007  . Type 2 diabetes, controlled, with peripheral neuropathy (HCC) 04/13/2006  . Depression 04/13/2006  . Mononeuritis 04/13/2006  . COPD with acute bronchitis (HCC) 04/13/2006  . 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 HEPATITIS B, HX OF 04/13/2006   Past Medical History:  Diagnosis Date  . Cholesteatoma   . 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 COPD (chronic obstructive pulmonary disease) (HCC)   . Depression   . Folliculitis barbae 12/08/2014  . GERD (gastroesophageal reflux disease) 06/09/2015  . HIV (human immunodeficiency virus infection) (HCC)   . ILD (interstitial lung disease) (HCC) 12/08/2014  . Marfan syndrome    Dr Ninetta Lights follows  . Mitral valve prolapse   . Rhinosinusitis 12/08/2014  . Venous insufficiency (chronic) (peripheral)   . VT (ventricular tachycardia) (HCC)     Family History  Problem Relation Age of Onset  . Cancer Mother   . Hypertension Mother   . Stroke Sister   . Hypertension Sister   . Aneurysm Sister     Past Surgical History:  Procedure Laterality Date  . AMPUTATION Right 06/06/2016   Procedure: AMPUTATION RAY RIGHT FIFTH RAY;  Surgeon: Nadara Mustard, MD;  Location: [facility];  Service: Orthopedics;  Laterality:  Right;  . APPENDECTOMY    . cholesteotoma removal    . LEFT HEART CATHETERIZATION WITH CORONARY ANGIOGRAM [name] 09/24/2013   Procedure: LEFT HEART CATHETERIZATION WITH CORONARY ANGIOGRAM;  Surgeon: Marykay Lex, MD;  Location: [facility] CATH LAB;  Service: Cardiovascular;  Laterality: [name];  . repair of perforated colon    . TEE WITHOUT CARDIOVERSION [name] 06/09/2016   Procedure: TRANSESOPHAGEAL ECHOCARDIOGRAM (TEE);  Surgeon: Wendall Stade, MD;  Location: [facility] ENDOSCOPY;  Service: Cardiovascular;  Laterality: [name];   Social History   Occupational History  . Not on file.   Social History Main Topics  . Smoking status: Current Some Day Smoker    Packs/day: 0.25    Years: 35.00    Types: Cigarettes  .  Smokeless tobacco: Never Used     Comment: trying to cut back  . Alcohol use No  . Drug use: No  . Sexual activity: No     Comment: pt. declined condoms

## 2016-09-16 ENCOUNTER — Ambulatory Visit (INDEPENDENT_AMBULATORY_CARE_PROVIDER_SITE_OTHER): Payer: Medicare Other | Admitting: Orthopedic Surgery

## 2016-09-28 ENCOUNTER — Ambulatory Visit (INDEPENDENT_AMBULATORY_CARE_PROVIDER_SITE_OTHER): Payer: 59 | Admitting: Orthopedic Surgery

## 2016-09-28 ENCOUNTER — Encounter (INDEPENDENT_AMBULATORY_CARE_PROVIDER_SITE_OTHER): Payer: Self-pay | Admitting: Family

## 2016-09-28 ENCOUNTER — Ambulatory Visit (INDEPENDENT_AMBULATORY_CARE_PROVIDER_SITE_OTHER): Payer: Medicare Other | Admitting: Family

## 2016-09-28 DIAGNOSIS — IMO0002 Reserved for concepts with insufficient information to code with codable children: Secondary | ICD-10-CM

## 2016-09-28 DIAGNOSIS — L97511 Non-pressure chronic ulcer of other part of right foot limited to breakdown of skin: Secondary | ICD-10-CM | POA: Insufficient documentation

## 2016-09-28 DIAGNOSIS — Z89431 Acquired absence of right foot: Secondary | ICD-10-CM | POA: Diagnosis not present

## 2016-09-28 DIAGNOSIS — L97521 Non-pressure chronic ulcer of other part of left foot limited to breakdown of skin: Secondary | ICD-10-CM

## 2016-09-28 DIAGNOSIS — R600 Localized edema: Secondary | ICD-10-CM

## 2016-09-28 NOTE — Progress Notes (Addendum)
Office Visit Note   Patient: [name]           Date of Birth: 12-23-1960           MRN: [id number] Visit Date: 09/28/2016              Requested by: Ginnie Smart, MD [address] [place], [place] [address] PCP: Johny Sax, MD  Chief Complaint  Patient presents with  . Right Foot - Follow-up      HPI: Patient is A 56 year old gentleman who presents today in follow-up for ulceration to his fourth toe on the right foot. Does also have him a small ulcer to the great toe on the left foot. He's been applying Silvadene ointment daily. States he does remove the dressings to Let the wounds get air at night. Denies any fevers or chills.  Has been seen at the podiatrist for the same. Requesting a nail trim today. Also questions where she can get orthotics at next reduction shoes has tried to do so at [employer] and been unable to have these fabricated  Assessment & Plan: Visit Diagnoses:  1. Foot amputation status, right (HCC)   2. Bilateral lower extremity edema- hx venous RFA 9/14   3. Neuropathic ulcer of left foot, limited to breakdown of skin (HCC)   4. Ulcer of toe of right foot, limited to breakdown of skin (HCC)     Plan: He will continue with daily wound cleansing. Discussed he needs to leave Silvadene dressings on around-the-clock. Have him an order to return to the foot Center for fabrication of orthotics and to receive extra-depth shoes.  Follow-Up Instructions: Return in about 4 weeks (around 10/26/2016).   Ortho Exam  Patient is alert, oriented, no adenopathy, well-dressed, normal affect, normal respiratory effort. The right foot fifth ray amputation is well-healed. He does have a 3 mm in diameter ulceration with some surrounding callus over the fourth toe dorsally. This has no drainage no surrounding erythema no sausage digit swelling no sign of infection. The left foot is plantigrade there is no erythema does have a 3 mm in diameter  ulceration over the dorsal aspect of the great toe as well. No sausage digit swelling no drainage no sign of infection. Does have a palpable dorsalis pedis pulse bilaterally. Does have thickened and onychomycotic nails 9. These were trimmed today without incident. Imaging: No results found.  Labs: Lab Results  Component Value Date   HGBA1C 5.3 06/05/2016   HGBA1C 5.5 03/25/2009   ESRSEDRATE 96 (H) 06/04/2016   CRP 15.6 (H) 06/04/2016   REPTSTATUS 06/12/2016 FINAL 06/07/2016   REPTSTATUS 06/12/2016 FINAL 06/07/2016   GRAMSTAIN  06/06/2016    FEW WBC PRESENT,BOTH PMN AND MONONUCLEAR NO ORGANISMS SEEN    CULT  06/07/2016    NO GROWTH 5 DAYS Performed at [facility]    CULT  06/07/2016    NO GROWTH 5 DAYS Performed at [facility]    LABORGA NO GROWTH 5 DAYS 07/13/2016    Orders:  No orders of the defined types were placed in this encounter.  No orders of the defined types were placed in this encounter.    Procedures: No procedures performed  Clinical Data: No additional findings.  ROS:  All other systems negative, except as noted in the HPI. Review of Systems  Constitutional: Negative for chills and fever.  Cardiovascular: Negative for leg swelling.  Musculoskeletal: Negative for gait problem.  Skin: Positive for  wound. Negative for color change.    Objective: Vital Signs: There were no vitals taken for this visit.  Specialty Comments:  No specialty comments available.  PMFS History: Patient Active Problem List   Diagnosis Date Noted  . Ulcer of toe of right foot, limited to breakdown of skin (HCC) 09/28/2016  . Venous stasis dermatitis of right lower extremity 07/19/2016  . Claw toe, acquired, right 07/19/2016  . Foot amputation status, right (HCC) 06/16/2016  . Bacteremia due to Staphylococcus aureus 06/07/2016  . Malnutrition of moderate degree 06/06/2016  . Pyogenic inflammation of bone (HCC) 06/04/2016  . Cellulitis 06/04/2016  .  AIDS (HCC) 06/09/2015  . 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 GERD (gastroesophageal reflux disease) 06/09/2015  . COPD exacerbation (HCC) 12/08/2014  . Folliculitis barbae 12/08/2014  . Rhinosinusitis 12/08/2014  . ILD (interstitial lung disease) (HCC) 12/08/2014  . Neuropathic foot ulcer (HCC) 10/29/2014  . PTSD (post-traumatic stress disorder) 09/23/2013  . Bilateral lower extremity edema- hx venous RFA 9/14 09/23/2013  . VT (ventricular tachycardia) (HCC) 09/23/2013  . Falls 06/12/2013  . Reflux 05/07/2012  . Mental status change 02/02/2011  . TOBACCO ABUSE 01/25/2010  . Varicose veins of lower extremities with ulcer (HCC) 08/11/2009  . DERMATITIS NOS 01/21/2008  . Chest pressure - when in VT 12/18/2007  . DIARRHEA 11/22/2007  . Type 2 diabetes, controlled, with peripheral neuropathy (HCC) 04/13/2006  . Depression 04/13/2006  . Mononeuritis 04/13/2006  . COPD with acute bronchitis (HCC) 04/13/2006  . 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 HEPATITIS B, HX OF 04/13/2006   Past Medical History:  Diagnosis Date  . Cholesteatoma   . 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 COPD (chronic obstructive pulmonary disease) (HCC)   . Depression   . Folliculitis barbae 12/08/2014  . GERD (gastroesophageal reflux disease) 06/09/2015  . HIV (human immunodeficiency virus infection) (HCC)   . ILD (interstitial lung disease) (HCC) 12/08/2014  . Marfan syndrome    Dr Ninetta LightsHatcher follows  . Mitral valve prolapse   . Rhinosinusitis 12/08/2014  . Venous insufficiency (chronic) (peripheral)   . VT (ventricular tachycardia) (HCC)     Family History  Problem Relation Age of Onset  . Cancer Mother   . Hypertension Mother   . Stroke Sister   . Hypertension Sister   . Aneurysm Sister     Past Surgical History:  Procedure Laterality Date  . AMPUTATION Right 06/06/2016   Procedure:  AMPUTATION RAY RIGHT FIFTH RAY;  Surgeon: Nadara MustardMarcus V Duda, MD;  Location: [facility];  Service: Orthopedics;  Laterality: Right;  . APPENDECTOMY    . cholesteotoma removal    . LEFT HEART CATHETERIZATION WITH CORONARY ANGIOGRAM [name] 09/24/2013   Procedure: LEFT HEART CATHETERIZATION WITH CORONARY ANGIOGRAM;  Surgeon: Marykay Lexavid W Harding, MD;  [facility];  Service: Cardiovascular;  Laterality: [name];  . repair of perforated colon    . TEE WITHOUT CARDIOVERSION [name] 06/09/2016   Procedure: TRANSESOPHAGEAL ECHOCARDIOGRAM (TEE);  Surgeon: Wendall StadePeter C Nishan, MD;  [facility];  Service: Cardiovascular;  Laterality: [name];   Social History   Occupational History  . Not on file.   Social History Main Topics  . Smoking status: Current Some Day Smoker    Packs/day: 0.25    Years: 35.00    Types: Cigarettes  . Smokeless tobacco: Never Used     Comment: trying to cut back  .  Alcohol use No  . Drug use: No  . Sexual activity: No     Comment: pt. declined condoms

## 2016-10-05 ENCOUNTER — Telehealth: Payer: Self-pay | Admitting: *Deleted

## 2016-10-05 ENCOUNTER — Other Ambulatory Visit: Payer: Self-pay | Admitting: *Deleted

## 2016-10-05 DIAGNOSIS — G629 Polyneuropathy, unspecified: Secondary | ICD-10-CM

## 2016-10-05 MED ORDER — MORPHINE SULFATE ER 40 MG PO CP24: 40.0000 mg | ORAL_CAPSULE | Freq: Two times a day (BID) | ORAL | 0 refills | Status: DC

## 2016-10-05 NOTE — Telephone Encounter (Signed)
Patient called for a refill on morphine and given to Dr. Ninetta Lights to sign. He will pick up tomorrow. Wendall Mola CMA

## 2016-10-06 NOTE — Progress Notes (Signed)
Cardiology Office Note   Date:  10/07/2016   ID:  Johnathan, [name] 1960-10-09, MRN [id number]  PCP:  Johnathan Sax, MD  Cardiologist:   [name]   Chief Complaint  Patient presents with  . Mitral Valve Prolapse      History of Present Illness: [name] is a 56 y.o. male who presents for f/u of VT and MR Patient of Dr [name]. History of Marfans and VT . I saw him in hospital 06/06/16 prior to amputation of right forefoot. Had cath in 2015 with no CAD MRI with no scar. Rx with beta blocker but patient had stopped On his own. Had runs of NSVT in hospital and put back on beta blocker. History of MVP Last echo in hospital reviewed TEE12/7/17   EF 60-65%  P3 medial scallop prolapse moderate anteriorly directed MR  No SBE   Did have right fith ray amputation on 06/06/16 BC;s positive for MSSA PICC Line and had Cefazolin for 2 weeks completed 06/20/16    Past Medical History:  Diagnosis Date  . Cholesteatoma   . 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 COPD (chronic obstructive pulmonary disease) (HCC)   . Depression   . Folliculitis barbae 12/08/2014  . GERD (gastroesophageal reflux disease) 06/09/2015  . HIV (human immunodeficiency virus infection) (HCC)   . ILD (interstitial lung disease) (HCC) 12/08/2014  . Marfan syndrome    Dr [name] follows  . Mitral valve prolapse   . Rhinosinusitis 12/08/2014  . Venous insufficiency (chronic) (peripheral)   . VT (ventricular tachycardia) (HCC)     Past Surgical History:  Procedure Laterality Date  . AMPUTATION Right 06/06/2016   Procedure: AMPUTATION RAY RIGHT FIFTH RAY;  Surgeon: Johnathan Mustard, MD;  Location: [facility];  Service: Orthopedics;  Laterality: Right;  . APPENDECTOMY    . cholesteotoma removal    . LEFT HEART CATHETERIZATION WITH CORONARY ANGIOGRAM [name] 09/24/2013   Procedure: LEFT HEART CATHETERIZATION WITH CORONARY ANGIOGRAM;  Surgeon: Johnathan Lex, MD;  Location: [facility] CATH LAB;  Service:  Cardiovascular;  Laterality: [name];  . repair of perforated colon    . TEE WITHOUT CARDIOVERSION [name] 06/09/2016   Procedure: TRANSESOPHAGEAL ECHOCARDIOGRAM (TEE);  Surgeon: Johnathan Stade, MD;  Location: [facility] ENDOSCOPY;  Service: Cardiovascular;  Laterality: [name];     Current Outpatient Prescriptions  Medication Sig Dispense Refill  . ALPRAZolam (XANAX) 0.5 MG tablet Take 0.5 mg by mouth 3 (three) times daily as needed for anxiety.    . clonazePAM (KLONOPIN) 1 MG tablet Take 1 mg by mouth at bedtime.    . DESCOVY 200-25 MG tablet TAKE 1 TABLET BY MOUTH DAILY. 30 tablet 11  . furosemide (LASIX) 40 MG tablet TAKE 1/2 TABLET (20 MG TOTAL) BY BY MOUTH EVERY MORNING. 30 tablet 5  . metoprolol (LOPRESSOR) 50 MG tablet Take 1 tablet (50 mg total) by mouth 2 (two) times daily. 60 tablet 11  . Morphine Sulfate 40 MG CP24 Take 40 mg by mouth 2 (two) times daily. 60 capsule 0  . Multiple Vitamins-Minerals (MULTIVITAMIN WITH MINERALS) tablet Take 1 tablet by mouth daily.    . nortriptyline (PAMELOR) 75 MG capsule Take 150 mg by mouth at bedtime.    . promethazine (PHENERGAN) 25 MG tablet TAKE 1 TABLET BY MOUTH EVERY 8 HOURS AS NEEDED. 30 tablet 4  . spironolactone (ALDACTONE) 25 MG tablet TAKE 1 TABLET BY MOUTH ONCE DAILY. 30 tablet 5  . SSD 1 %  cream APPLY TO AFFECTED AREA DAILY 50 g 0  . TIVICAY 50 MG tablet TAKE 1 TABLET BY MOUTH DAILY. 30 tablet 11   No current facility-administered medications for this visit.     Allergies:   Tetracycline hcl    Social History:  The patient  reports that he has been smoking Cigarettes.  He has a 8.75 pack-year smoking history. He has never used smokeless tobacco. He reports that he does not drink alcohol or use drugs.   Family History:  The patient's family history includes Aneurysm in his sister; Cancer in his mother; Hypertension in his mother and sister; Stroke in his sister.    ROS:  Please see the history of present illness.   Otherwise, review of systems  are positive for none.   All other systems are reviewed and negative.    PHYSICAL EXAM: VS:  BP 92/60   Pulse 87   Ht  (1.88 m)   Wt 216 lb 12.8 oz (98.3 kg)   SpO2 95%   BMI 27.84 kg/m  , BMI Body mass index is 27.84 kg/m. Affect appropriate Healthy:  appears stated age HEENT: normal Neck supple with no adenopathy JVP normal no bruits no thyromegaly Lungs clear with no wheezing and good diaphragmatic motion Heart:  S1/S2 no murmur, no rub, gallop or click PMI normal Abdomen: benighn, BS positve, no tenderness, no AAA no bruit.  No HSM or HJR Distal pulses intact with no bruits No edema Neuro non-focal Skin warm and dry No muscular weakness Small eschar on 5th metatarsal head post amputation      EKG:  06/11/16 SR rate 92 nonspecific IVCD normal ST segments no AV block   Recent Labs: 06/05/2016: TSH 0.903 06/07/2016: Magnesium 2.0 07/13/2016: ALT 10; BUN 15; Creat 1.09; Hemoglobin 13.3; Platelets 190; Potassium 4.3; Sodium 136    Lipid Panel    Component Value Date/Time   CHOL 172 07/13/2016 1608   TRIG 95 07/13/2016 1608   HDL 42 07/13/2016 1608   CHOLHDL 4.1 07/13/2016 1608   VLDL 19 07/13/2016 1608   LDLCALC 111 (H) 07/13/2016 1608      Wt Readings from Last 3 Encounters:  10/07/16 216 lb 12.8 oz (98.3 kg)  09/14/16 216 lb (98 kg)  07/19/16 216 lb (98 kg)      Other studies Reviewed: Additional studies/ records that were reviewed today include: Notes Dr [name] Notes prolonged hospitalization in December 2017 TEE and echo results .    ASSESSMENT AND PLAN:  1. VT quiescent no palpitations monitor on beta blocker f/u GT 2. MVP loud murmur f/u echo has been moderate in past  3. ID/MSSA no longer on antibiotics seeing Dr [name]  4. ILD/COPD no dyspnea or wheezing lung exam ok today  5. AIDS  Lab work with primary RNA quant 07/13/16 <20 6. Marfans echo to assess MVP and aortic root  7. Anxiety / Depression  Continue pamelor and xanax doing better    8. Osteomyelitis:  Using silver sulfadiazine cream eschar fimr f/u [name]    Current medicines are reviewed at length with the patient today.  The patient does not have concerns regarding medicines.  The following changes have been made:  no change  Labs/ tests ordered today include: Echo TTE   Orders Placed This Encounter  Procedures  . ECHOCARDIOGRAM COMPLETE     Disposition:   FU with me in 6 months      Signed, Charlton Haws, MD  10/07/2016 11:01 AM  [facility] [place], [place], [place]  [address] Phone: [phone]; Fax: [phone]

## 2016-10-07 ENCOUNTER — Encounter: Payer: Self-pay | Admitting: Cardiovascular Disease

## 2016-10-07 ENCOUNTER — Ambulatory Visit (INDEPENDENT_AMBULATORY_CARE_PROVIDER_SITE_OTHER): Payer: Medicare Other | Admitting: Cardiovascular Disease

## 2016-10-07 VITALS — BP 92/60 | HR 87 | Ht 74.0 in | Wt 216.8 lb

## 2016-10-07 DIAGNOSIS — I341 Nonrheumatic mitral (valve) prolapse: Secondary | ICD-10-CM

## 2016-10-07 NOTE — Patient Instructions (Signed)

## 2016-10-27 ENCOUNTER — Other Ambulatory Visit: Payer: Self-pay | Admitting: Infectious Diseases

## 2016-10-28 ENCOUNTER — Telehealth (INDEPENDENT_AMBULATORY_CARE_PROVIDER_SITE_OTHER): Payer: Self-pay | Admitting: *Deleted

## 2016-10-28 NOTE — Telephone Encounter (Signed)
Patient called in this afternoon in regards to needing to talk to you about some concerns. He would not tell me what he needed his CB # [phone]. Thank you

## 2016-10-31 ENCOUNTER — Ambulatory Visit (INDEPENDENT_AMBULATORY_CARE_PROVIDER_SITE_OTHER): Payer: Medicare Other | Admitting: Family

## 2016-10-31 ENCOUNTER — Encounter (INDEPENDENT_AMBULATORY_CARE_PROVIDER_SITE_OTHER): Payer: Self-pay | Admitting: Orthopedic Surgery

## 2016-10-31 VITALS — Ht 74.0 in | Wt 216.0 lb

## 2016-10-31 DIAGNOSIS — L97521 Non-pressure chronic ulcer of other part of left foot limited to breakdown of skin: Secondary | ICD-10-CM

## 2016-10-31 DIAGNOSIS — L97511 Non-pressure chronic ulcer of other part of right foot limited to breakdown of skin: Secondary | ICD-10-CM | POA: Diagnosis not present

## 2016-10-31 MED ORDER — CLINDAMYCIN HCL 300 MG PO CAPS: 300.0000 mg | ORAL_CAPSULE | Freq: Three times a day (TID) | ORAL | 0 refills | Status: DC

## 2016-10-31 NOTE — Progress Notes (Signed)
Office Visit Note   Patient: [name]           Date of Birth: [birth year]           MRN: [id number] Visit Date: 10/31/2016              Requested by: Ginnie Smart, MD [address] [place], [place] [address] PCP: Johny Sax, MD  Chief Complaint  Patient presents with  . Right Foot - Open Wound    Ulceration right 4th toe  . Left Foot - Wound Check    Ulceration left great toe      HPI: The patient is a 56 year old gentleman who presents today for evaluation of bilateral foot ulcerations. He's been using a PRID ointment which is a homeopathic salve from [employer]. The fourth toe ulcer on the right foot he feels is getting better. Is in regular shoewear today. Is concerned for redness to lateral right foot and 4th toe. States he needs ED shoes but has had trouble getting these due to insurance and payment. States is DM 2 with diet and exercise control.   Assessment & Plan: Visit Diagnoses:  1. Neuropathic ulcer of left foot, limited to breakdown of skin (HCC)   2. Ulcer of toe of right foot, limited to breakdown of skin (HCC)     Plan: Have provided a refill for his clindamycin. Discussed return precautions. He will follow-up in office in 2 weeks. Have discussed concern for possible osteomyelitis of the fourth toe right foot.  Follow-Up Instructions: Return in about 2 weeks (around 11/14/2016).   Ortho Exam  Patient is alert, oriented, no adenopathy, well-dressed, normal affect, normal respiratory effort. Right foot the lateral aspect of the foot is red. There is no induration. The fourth toe has ulceration this is 4 mm in diameter and 3 mm deep. Does not probe to bone or fascia. There is scant clear drainage there is erythema and swelling to the toe. It is tender. Left foot great toe with dorsal ulceration this has epithelialized. There is 2 mm in diameter ecchymotic area this has no drainage no skin breakdown.  Imaging: No results found.  Labs: Lab  Results  Component Value Date   HGBA1C 5.3 06/05/2016   HGBA1C 5.5 03/25/2009   ESRSEDRATE 96 (H) 06/04/2016   CRP 15.6 (H) 06/04/2016   REPTSTATUS 06/12/2016 FINAL 06/07/2016   REPTSTATUS 06/12/2016 FINAL 06/07/2016   GRAMSTAIN  06/06/2016    FEW WBC PRESENT,BOTH PMN AND MONONUCLEAR NO ORGANISMS SEEN    CULT  06/07/2016    NO GROWTH 5 DAYS Performed at [facility]    CULT  06/07/2016    NO GROWTH 5 DAYS Performed at [facility]    LABORGA NO GROWTH 5 DAYS 07/13/2016    Orders:  No orders of the defined types were placed in this encounter.  Meds ordered this encounter  Medications  . clindamycin (CLEOCIN) 300 MG capsule    Sig: Take 1 capsule (300 mg total) by mouth 3 (three) times daily.    Dispense:  42 capsule    Refill:  0     Procedures: No procedures performed  Clinical Data: No additional findings.  ROS:  All other systems negative, except as noted in the HPI. Review of Systems  Constitutional: Negative for chills and fever.  Cardiovascular: Negative for leg swelling.  Skin: Positive for color change and wound.    Objective: Vital Signs: Ht  (1.88  m)   Wt 216 lb (98 kg)   BMI 27.73 kg/m   Specialty Comments:  No specialty comments available.  PMFS History: Patient Active Problem List   Diagnosis Date Noted  . Ulcer of toe of right foot, limited to breakdown of skin (HCC) 09/28/2016  . Venous stasis dermatitis of right lower extremity 07/19/2016  . Claw toe, acquired, right 07/19/2016  . Foot amputation status, right (HCC) 06/16/2016  . Bacteremia due to Staphylococcus aureus 06/07/2016  . Malnutrition of moderate degree 06/06/2016  . Pyogenic inflammation of bone (HCC) 06/04/2016  . Cellulitis 06/04/2016  . AIDS (HCC) 06/09/2015  . 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 GERD (gastroesophageal reflux disease) 06/09/2015  . COPD exacerbation (HCC) 12/08/2014  . Folliculitis barbae  12/08/2014  . Rhinosinusitis 12/08/2014  . ILD (interstitial lung disease) (HCC) 12/08/2014  . Neuropathic foot ulcer (HCC) 10/29/2014  . PTSD (post-traumatic stress disorder) 09/23/2013  . Bilateral lower extremity edema- hx venous RFA 9/14 09/23/2013  . VT (ventricular tachycardia) (HCC) 09/23/2013  . Falls 06/12/2013  . Reflux 05/07/2012  . Mental status change 02/02/2011  . TOBACCO ABUSE 01/25/2010  . Varicose veins of lower extremities with ulcer (HCC) 08/11/2009  . DERMATITIS NOS 01/21/2008  . Chest pressure - when in VT 12/18/2007  . DIARRHEA 11/22/2007  . Type 2 diabetes, controlled, with peripheral neuropathy (HCC) 04/13/2006  . Depression 04/13/2006  . Mononeuritis 04/13/2006  . COPD with acute bronchitis (HCC) 04/13/2006  . 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 HEPATITIS B, HX OF 04/13/2006   Past Medical History:  Diagnosis Date  . Cholesteatoma   . 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 COPD (chronic obstructive pulmonary disease) (HCC)   . Depression   . Folliculitis barbae 12/08/2014  . GERD (gastroesophageal reflux disease) 06/09/2015  . HIV (human immunodeficiency virus infection) (HCC)   . ILD (interstitial lung disease) (HCC) 12/08/2014  . Marfan syndrome    Dr Ninetta Lights follows  . Mitral valve prolapse   . Rhinosinusitis 12/08/2014  . Venous insufficiency (chronic) (peripheral)   . VT (ventricular tachycardia) (HCC)     Family History  Problem Relation Age of Onset  . Cancer Mother   . Hypertension Mother   . Stroke Sister   . Hypertension Sister   . Aneurysm Sister     Past Surgical History:  Procedure Laterality Date  . AMPUTATION Right 06/06/2016   Procedure: AMPUTATION RAY RIGHT FIFTH RAY;  Surgeon: Nadara Mustard, MD;  Location: [facility];  Service: Orthopedics;  Laterality: Right;  . APPENDECTOMY    . cholesteotoma removal    . LEFT HEART CATHETERIZATION WITH CORONARY ANGIOGRAM  [name] 09/24/2013   Procedure: LEFT HEART CATHETERIZATION WITH CORONARY ANGIOGRAM;  Surgeon: Marykay Lex, MD;  Location: [facility] CATH LAB;  Service: Cardiovascular;  Laterality: [name];  . repair of perforated colon    . TEE WITHOUT CARDIOVERSION [name] 06/09/2016   Procedure: TRANSESOPHAGEAL ECHOCARDIOGRAM (TEE);  Surgeon: Wendall Stade, MD;  Location: [facility] ENDOSCOPY;  Service: Cardiovascular;  Laterality: [name];   Social History   Occupational History  . Not on file.   Social History Main Topics  . Smoking status: Current Some Day Smoker    Packs/day: 0.25    Years: 35.00    Types: Cigarettes  . Smokeless tobacco: Never Used     Comment: trying to cut back  . Alcohol use No  .  Drug use: No  . Sexual activity: No     Comment: pt. declined condoms

## 2016-11-02 ENCOUNTER — Other Ambulatory Visit: Payer: Self-pay | Admitting: *Deleted

## 2016-11-02 DIAGNOSIS — G629 Polyneuropathy, unspecified: Secondary | ICD-10-CM

## 2016-11-02 MED ORDER — MORPHINE SULFATE ER 40 MG PO CP24
40.0000 mg | ORAL_CAPSULE | Freq: Two times a day (BID) | ORAL | 0 refills | Status: DC
Start: 2016-11-02 — End: 2016-12-01

## 2016-11-07 ENCOUNTER — Ambulatory Visit: Payer: Medicare Other | Admitting: Podiatry

## 2016-12-01 ENCOUNTER — Other Ambulatory Visit: Payer: Self-pay | Admitting: *Deleted

## 2016-12-01 DIAGNOSIS — G629 Polyneuropathy, unspecified: Secondary | ICD-10-CM

## 2016-12-01 MED ORDER — MORPHINE SULFATE ER 40 MG PO CP24
40.0000 mg | ORAL_CAPSULE | Freq: Two times a day (BID) | ORAL | 0 refills | Status: DC
Start: 2016-12-01 — End: 2016-12-27

## 2016-12-07 ENCOUNTER — Ambulatory Visit (HOSPITAL_COMMUNITY): Payer: Medicare Other

## 2016-12-15 ENCOUNTER — Ambulatory Visit (INDEPENDENT_AMBULATORY_CARE_PROVIDER_SITE_OTHER): Payer: Medicare Other | Admitting: Orthopedic Surgery

## 2016-12-15 ENCOUNTER — Encounter (INDEPENDENT_AMBULATORY_CARE_PROVIDER_SITE_OTHER): Payer: Self-pay | Admitting: Orthopedic Surgery

## 2016-12-15 ENCOUNTER — Telehealth: Payer: Self-pay | Admitting: *Deleted

## 2016-12-15 VITALS — Ht 74.0 in | Wt 216.0 lb

## 2016-12-15 DIAGNOSIS — I87323 Chronic venous hypertension (idiopathic) with inflammation of bilateral lower extremity: Secondary | ICD-10-CM | POA: Insufficient documentation

## 2016-12-15 DIAGNOSIS — L97411 Non-pressure chronic ulcer of right heel and midfoot limited to breakdown of skin: Secondary | ICD-10-CM | POA: Diagnosis not present

## 2016-12-15 DIAGNOSIS — E1142 Type 2 diabetes mellitus with diabetic polyneuropathy: Secondary | ICD-10-CM

## 2016-12-15 MED ORDER — GABAPENTIN 100 MG PO CAPS: 100.0000 mg | ORAL_CAPSULE | Freq: Three times a day (TID) | ORAL | 3 refills | Status: DC

## 2016-12-15 NOTE — Telephone Encounter (Signed)
Patient called to request that Dr. Ninetta LightsHatcher give him an Rx for diabetic shoes. Advised patient that he really needs a PCP and he said he will eventually work on finding one. I offered to refer him to a PCP and he said he prefers to find his own. He said he just needs Dr. Ninetta LightsHatcher to give him an Rx this one time, because he has a "place" on his foot where it rubbed and he feels it is caused by his diabetes. He has already been seen by Dr. Lajoyce Cornersuda and a podiatrist. Please advise

## 2016-12-15 NOTE — Progress Notes (Signed)
Office Visit Note   Patient: [name]           Date of Birth: 30-Mar-1961           MRN: [id number] Visit Date: 12/15/2016              Requested by: Ginnie Smart, MD [address] [place], [place] [address] PCP: Ginnie Smart, MD  Chief Complaint  Patient presents with  . Right Foot - Wound Check      HPI: Patient is a 56 year old gentleman who is status post a right fifth ray amputation in December of last year patient states he developed a superficial ulcer laterally over his foot from tight shoe wear. Patient is been placed on clindamycin twice. Patient states he went to [facility] last night to obtain wound cultures that he thought would be able to be obtained immediately. Patient feels like his ulcers developed from tight shoe wear.  Assessment & Plan: Visit Diagnoses:  1. Type 2 diabetes, controlled, with peripheral neuropathy (HCC)   2. Idiopathic chronic venous hypertension of both lower extremities with inflammation   3. Midfoot skin ulcer, right, limited to breakdown of skin (HCC)     Plan: We will place him in a postoperative shoe with a felt relieving donut for the ulcer lateral right foot. He will wash this with soap and water daily with a Band-Aid change daily. He does complain of neuropathic pain and we will start him on a low dose Neurontin. Patient has brawny venous insufficiency and he was instructed to go to [facility] for 15-20 mm compression stockings.  Follow-Up Instructions: Return in about 2 weeks (around 12/29/2016).   Ortho Exam  Patient is alert, oriented, no adenopathy, well-dressed, normal affect, normal respiratory effort. On examination patient has brawny skin color changes in both legs but no open ulcers. There is pitting edema in both legs. Left foot has no ulcers right foot the surgical incision is healed well he has a superficial abrasion over the lateral aspect of the right foot base of the fifth  metatarsal with the ulcer 4 x 5 cm and 0.1 mm deep this does not probe to bone or tendon there is no cellulitis no odor no drainage there is 100% healthy granulation tissue.  Imaging: No results found.  Labs: Lab Results  Component Value Date   HGBA1C 5.3 06/05/2016   HGBA1C 5.5 03/25/2009   ESRSEDRATE 96 (H) 06/04/2016   CRP 15.6 (H) 06/04/2016   REPTSTATUS 06/12/2016 FINAL 06/07/2016   REPTSTATUS 06/12/2016 FINAL 06/07/2016   GRAMSTAIN  06/06/2016    FEW WBC PRESENT,BOTH PMN AND MONONUCLEAR NO ORGANISMS SEEN    CULT  06/07/2016    NO GROWTH 5 DAYS Performed at [facility]    CULT  06/07/2016    NO GROWTH 5 DAYS Performed at [facility]    LABORGA NO GROWTH 5 DAYS 07/13/2016    Orders:  No orders of the defined types were placed in this encounter.  No orders of the defined types were placed in this encounter.    Procedures: No procedures performed  Clinical Data: No additional findings.  ROS:  All other systems negative, except as noted in the HPI. Review of Systems  Objective: Vital Signs: Ht 6\' 2"  (1.88 m)   Wt 216 lb (98 kg)   BMI 27.73 kg/m   Specialty Comments:  No specialty comments available.  PMFS History: Patient Active Problem List  Diagnosis Date Noted  . Idiopathic chronic venous hypertension of both lower extremities with inflammation 12/15/2016  . Midfoot skin ulcer, right, limited to breakdown of skin (HCC) 12/15/2016  . Ulcer of toe of right foot, limited to breakdown of skin (HCC) 09/28/2016  . Venous stasis dermatitis of right lower extremity 07/19/2016  . Claw toe, acquired, right 07/19/2016  . Foot amputation status, right (HCC) 06/16/2016  . Bacteremia due to Staphylococcus aureus 06/07/2016  . Malnutrition of moderate degree 06/06/2016  . Pyogenic inflammation of bone (HCC) 06/04/2016  . Cellulitis 06/04/2016  . AIDS (HCC) 06/09/2015  . 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 GERD (gastroesophageal reflux disease) 06/09/2015  . COPD exacerbation (HCC) 12/08/2014  . Folliculitis barbae 12/08/2014  . Rhinosinusitis 12/08/2014  . ILD (interstitial lung disease) (HCC) 12/08/2014  . Neuropathic foot ulcer (HCC) 10/29/2014  . PTSD (post-traumatic stress disorder) 09/23/2013  . Bilateral lower extremity edema- hx venous RFA 9/14 09/23/2013  . VT (ventricular tachycardia) (HCC) 09/23/2013  . Falls 06/12/2013  . Reflux 05/07/2012  . Mental status change 02/02/2011  . TOBACCO ABUSE 01/25/2010  . Varicose veins of lower extremities with ulcer (HCC) 08/11/2009  . DERMATITIS NOS 01/21/2008  . Chest pressure - when in VT 12/18/2007  . DIARRHEA 11/22/2007  . Type 2 diabetes, controlled, with peripheral neuropathy (HCC) 04/13/2006  . Depression 04/13/2006  . Mononeuritis 04/13/2006  . COPD with acute bronchitis (HCC) 04/13/2006  . 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 HEPATITIS B, HX OF 04/13/2006   Past Medical History:  Diagnosis Date  . Cholesteatoma   . 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 COPD (chronic obstructive pulmonary disease) (HCC)   . Depression   . Folliculitis barbae 12/08/2014  . GERD (gastroesophageal reflux disease) 06/09/2015  . HIV (human immunodeficiency virus infection) (HCC)   . ILD (interstitial lung disease) (HCC) 12/08/2014  . Marfan syndrome    Dr Ninetta LightsHatcher follows  . Mitral valve prolapse   . Rhinosinusitis 12/08/2014  . Venous insufficiency (chronic) (peripheral)   . VT (ventricular tachycardia) (HCC)     Family History  Problem Relation Age of Onset  . Cancer Mother   . Hypertension Mother   . Stroke Sister   . Hypertension Sister   . Aneurysm Sister     Past Surgical History:  Procedure Laterality Date  . AMPUTATION Right 06/06/2016   Procedure: AMPUTATION RAY RIGHT FIFTH RAY;  Surgeon: Nadara MustardMarcus V Duda, MD;  Location: [facility];  Service: Orthopedics;   Laterality: Right;  . APPENDECTOMY    . cholesteotoma removal    . LEFT HEART CATHETERIZATION WITH CORONARY ANGIOGRAM [name] 09/24/2013   Procedure: LEFT HEART CATHETERIZATION WITH CORONARY ANGIOGRAM;  Surgeon: Marykay Lexavid W Harding, MD;  [facility];  Service: Cardiovascular;  Laterality: [name];  . repair of perforated colon    . TEE WITHOUT CARDIOVERSION [name] 06/09/2016   Procedure: TRANSESOPHAGEAL ECHOCARDIOGRAM (TEE);  Surgeon: Wendall StadePeter C Nishan, MD;  [facility];  Service: Cardiovascular;  Laterality: [name];   Social History   Occupational History  . Not on file.   Social History Main Topics  . Smoking status: Current Some Day Smoker    Packs/day: 0.25    Years: 35.00    Types: Cigarettes  . Smokeless tobacco: Never Used     Comment: trying to cut back  . Alcohol use No  . Drug use: No  .  Sexual activity: No     Comment: pt. declined condoms

## 2016-12-16 NOTE — Telephone Encounter (Signed)
Needs PCP to address thanks

## 2016-12-19 NOTE — Telephone Encounter (Signed)
Left patient a voice mail stating that I was calling him back regarding the medication that he has requested. Per Dr. Ninetta LightsHatcher he needs to follow up with a primary care MD. Any questions please call nurse triage. Wendall MolaJacqueline Cockerham

## 2016-12-23 ENCOUNTER — Other Ambulatory Visit: Payer: Self-pay

## 2016-12-23 ENCOUNTER — Ambulatory Visit (HOSPITAL_COMMUNITY): Payer: Medicare Other | Attending: Cardiovascular Disease

## 2016-12-23 DIAGNOSIS — I5189 Other ill-defined heart diseases: Secondary | ICD-10-CM | POA: Diagnosis not present

## 2016-12-23 DIAGNOSIS — I341 Nonrheumatic mitral (valve) prolapse: Secondary | ICD-10-CM | POA: Diagnosis not present

## 2016-12-23 DIAGNOSIS — J984 Other disorders of lung: Secondary | ICD-10-CM | POA: Insufficient documentation

## 2016-12-27 ENCOUNTER — Other Ambulatory Visit: Payer: Self-pay | Admitting: *Deleted

## 2016-12-27 DIAGNOSIS — G629 Polyneuropathy, unspecified: Secondary | ICD-10-CM

## 2016-12-27 MED ORDER — MORPHINE SULFATE ER 40 MG PO CP24: 40.0000 mg | ORAL_CAPSULE | Freq: Two times a day (BID) | ORAL | 0 refills | Status: DC

## 2016-12-29 ENCOUNTER — Telehealth: Payer: Self-pay | Admitting: Cardiovascular Disease

## 2016-12-29 ENCOUNTER — Ambulatory Visit (INDEPENDENT_AMBULATORY_CARE_PROVIDER_SITE_OTHER): Payer: Medicare Other | Admitting: Orthopedic Surgery

## 2016-12-29 DIAGNOSIS — Q8740 Marfan's syndrome, unspecified: Secondary | ICD-10-CM

## 2016-12-29 NOTE — Telephone Encounter (Signed)
Patient returning call in regards to echocardiogram, the call was made on 12-26-16. Thanks.

## 2016-12-29 NOTE — Telephone Encounter (Signed)
Notes recorded by Wendall StadeNishan, Peter C, MD on 12/24/2016 at 1:33 PM EDT EF normal prolapse not as apparent MR only mild good f/u echo in a year.   Notified the pt of echo results and recommendation to repeat this in one year, as indicated above, per Dr Eden EmmsNishan.  Informed the pt that I will place the order for the echo in the system and [facility] him back to arrange his echo for one year out.  Pt verbalized understanding and agrees with this plan.

## 2017-01-11 ENCOUNTER — Ambulatory Visit (INDEPENDENT_AMBULATORY_CARE_PROVIDER_SITE_OTHER): Payer: Medicare Other | Admitting: Orthopedic Surgery

## 2017-01-26 ENCOUNTER — Other Ambulatory Visit: Payer: Self-pay | Admitting: *Deleted

## 2017-01-26 ENCOUNTER — Other Ambulatory Visit (HOSPITAL_COMMUNITY): Payer: Self-pay | Admitting: Psychiatry

## 2017-01-26 DIAGNOSIS — G629 Polyneuropathy, unspecified: Secondary | ICD-10-CM

## 2017-01-26 MED ORDER — MORPHINE SULFATE ER 40 MG PO CP24: 40.0000 mg | ORAL_CAPSULE | Freq: Two times a day (BID) | ORAL | 0 refills | Status: DC

## 2017-01-26 NOTE — Progress Notes (Signed)
Reprinted at patient's request Andree CossHowell, Merdis Snodgrass M, RN

## 2017-02-22 ENCOUNTER — Other Ambulatory Visit: Payer: Self-pay | Admitting: *Deleted

## 2017-02-22 DIAGNOSIS — G629 Polyneuropathy, unspecified: Secondary | ICD-10-CM

## 2017-02-22 MED ORDER — MORPHINE SULFATE ER 40 MG PO CP24: 40.0000 mg | ORAL_CAPSULE | Freq: Two times a day (BID) | ORAL | 0 refills | Status: DC

## 2017-02-24 ENCOUNTER — Other Ambulatory Visit (HOSPITAL_COMMUNITY): Payer: Self-pay | Admitting: Psychiatry

## 2017-03-15 ENCOUNTER — Other Ambulatory Visit: Payer: Self-pay | Admitting: Licensed Clinical Social Worker

## 2017-03-15 ENCOUNTER — Other Ambulatory Visit: Payer: Self-pay | Admitting: *Deleted

## 2017-03-15 DIAGNOSIS — G629 Polyneuropathy, unspecified: Secondary | ICD-10-CM

## 2017-03-15 MED ORDER — MORPHINE SULFATE ER 40 MG PO CP24: 40.0000 mg | ORAL_CAPSULE | Freq: Two times a day (BID) | ORAL | 0 refills | Status: DC

## 2017-04-04 IMAGING — CR DG CHEST 2V
3 series · 3 of 3 positions shown · non-contrast
Comparison: Chest x-ray 06/09/2015.

CLINICAL DATA: 55-year-old male with history of cough and fever for
the past 4 days. Possible sepsis.

EXAM:
CHEST  2 VIEW

[w chest lat (1 of 2)]
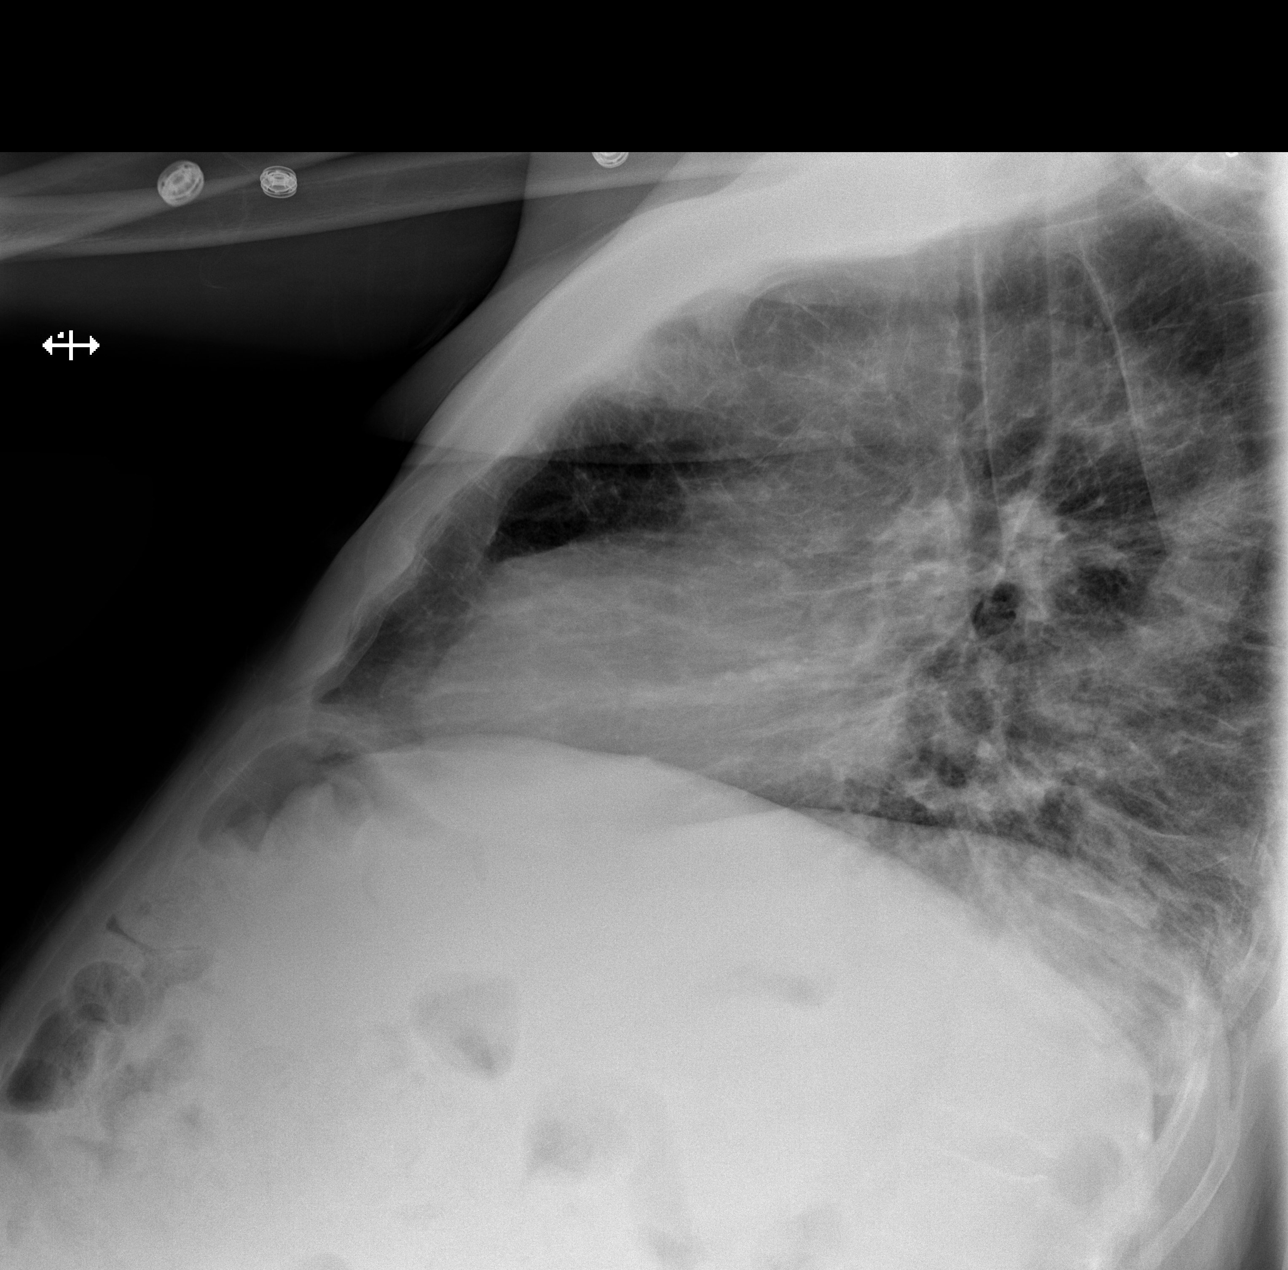

[w chest lat (2 of 2)]
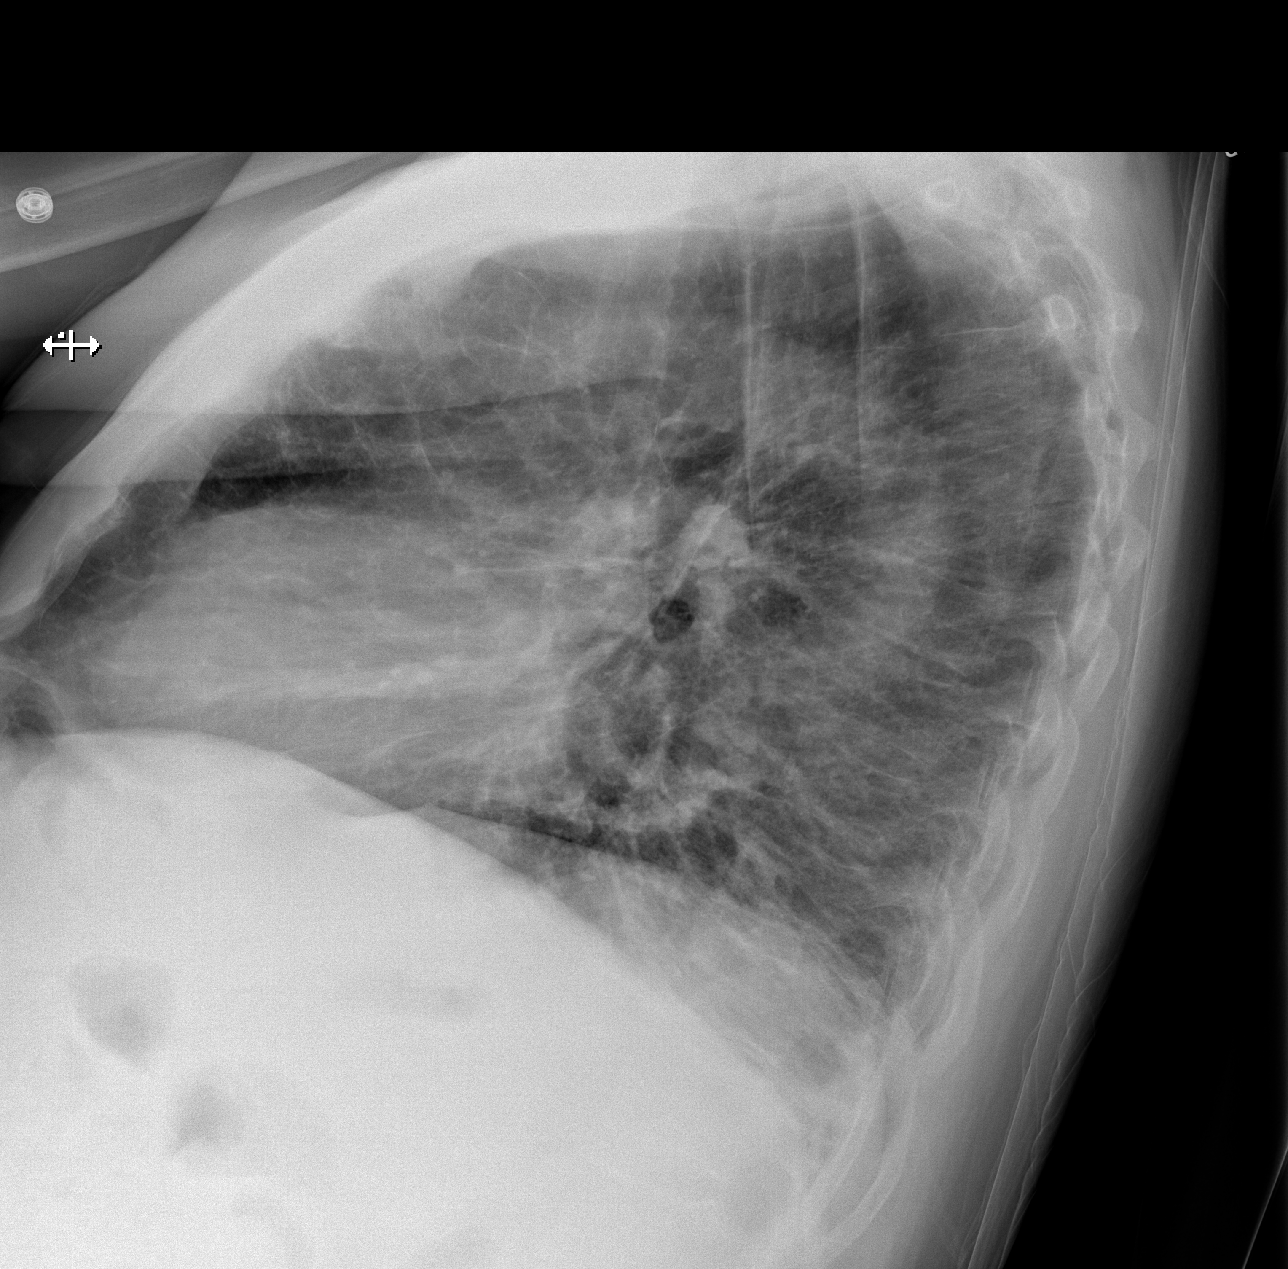

[x chest ap]
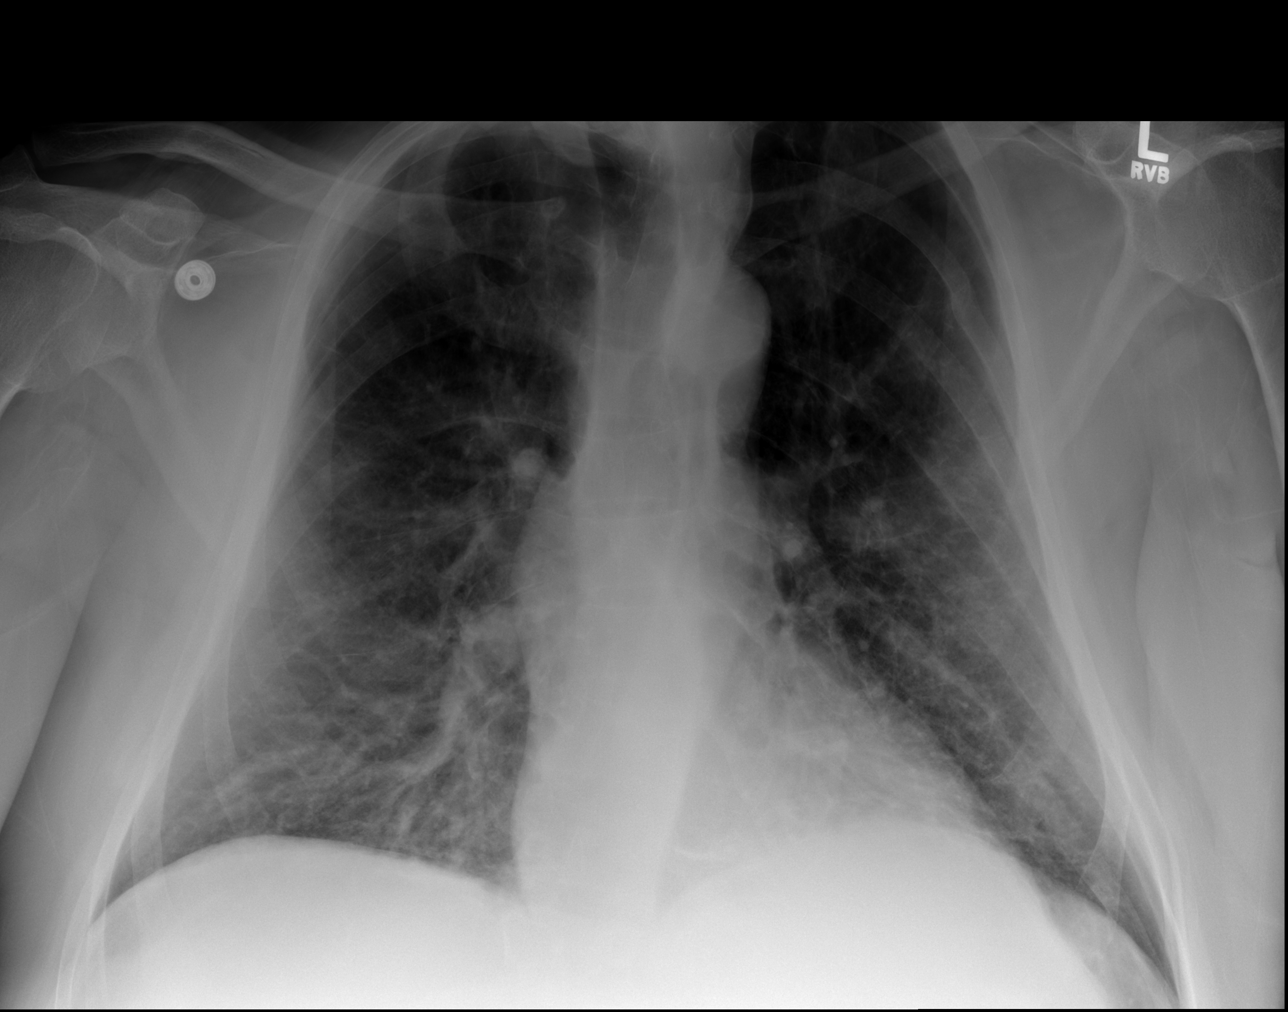

[3 of 3 positions shown; findings below may reference images not displayed]

FINDINGS: Emphysematous changes are again noted throughout the lungs
bilaterally, most notable in the lung apices. No confluent
consolidative airspace disease. Slight coarsening of interstitial
markings throughout the mid to lower lungs bilaterally is chronic
and similar to the prior examination. Bibasilar linear opacities
favored to reflect areas of chronic scarring and/or subsegmental
atelectasis. No pleural effusions. No evidence of pulmonary edema.
Heart size is normal. The patient is rotated to the right on today's
exam, resulting in distortion of the mediastinal contours and
reduced diagnostic sensitivity and specificity for mediastinal
pathology.
IMPRESSION: 1. Chronic changes in the lungs suggestive of underlying COPD
redemonstrated, as above. No definite radiographic evidence of acute
cardiopulmonary disease.

## 2017-04-05 IMAGING — DX DG FOOT COMPLETE 3+V*R*
3 series · 3 of 3 positions shown · non-contrast
Comparison: 05/06/2016.

CLINICAL DATA: Continued pain and foul odor from a nonhealing wound
in the lateral aspect of the right foot at the fifth MTP joint.

EXAM:
RIGHT FOOT COMPLETE - 3+ VIEW

[foot ap]
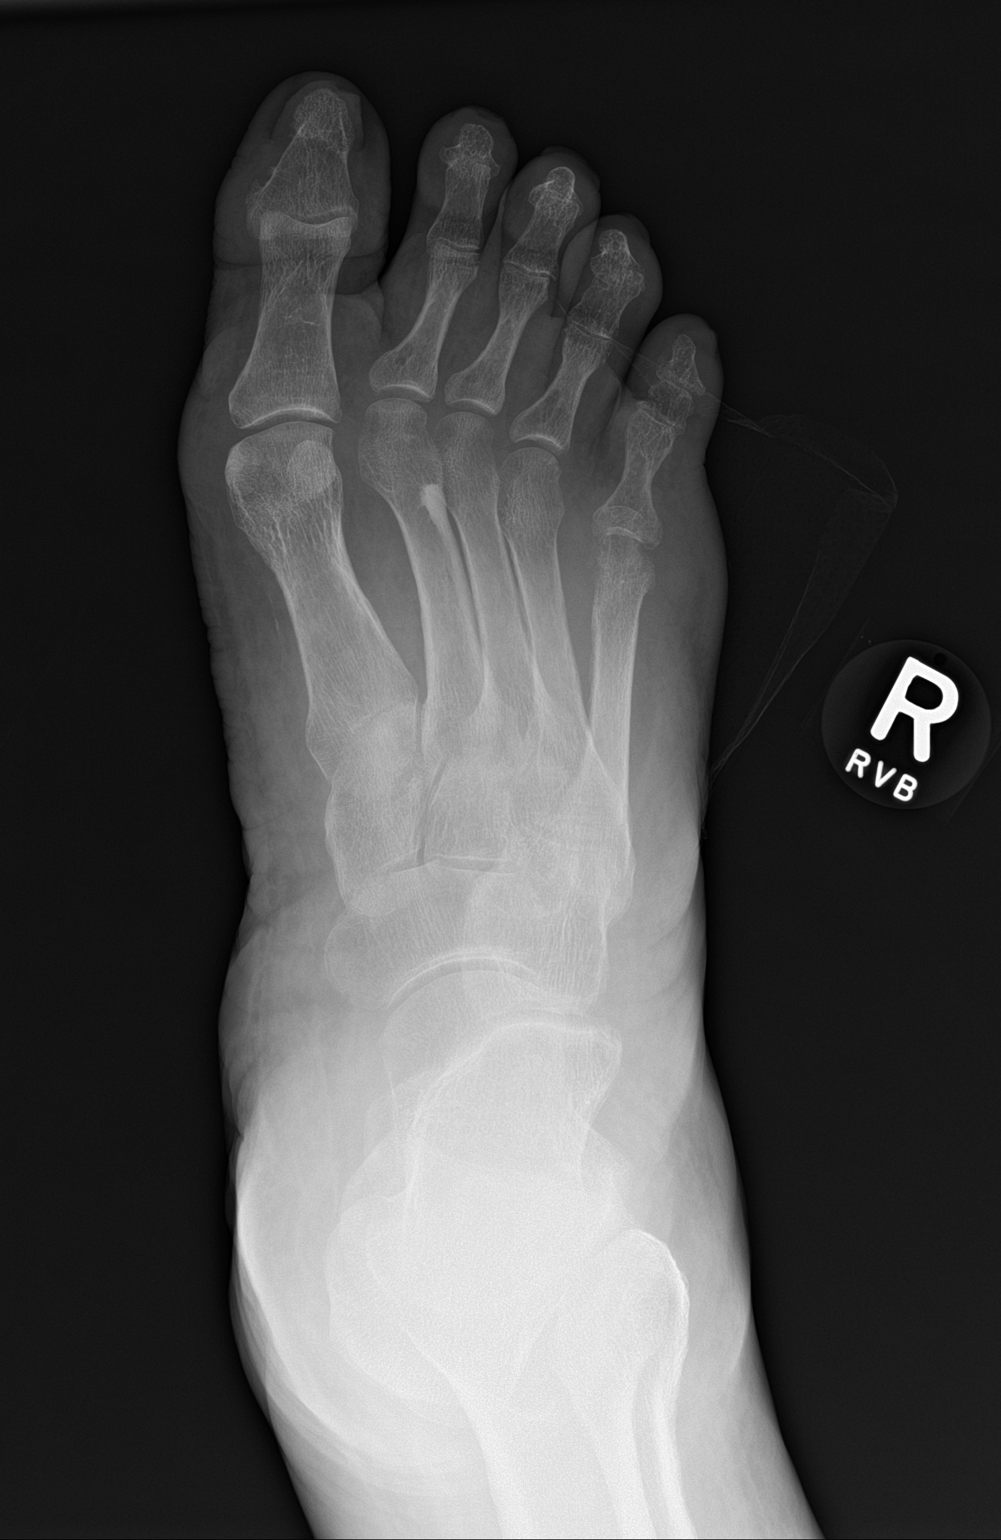

[foot obl]
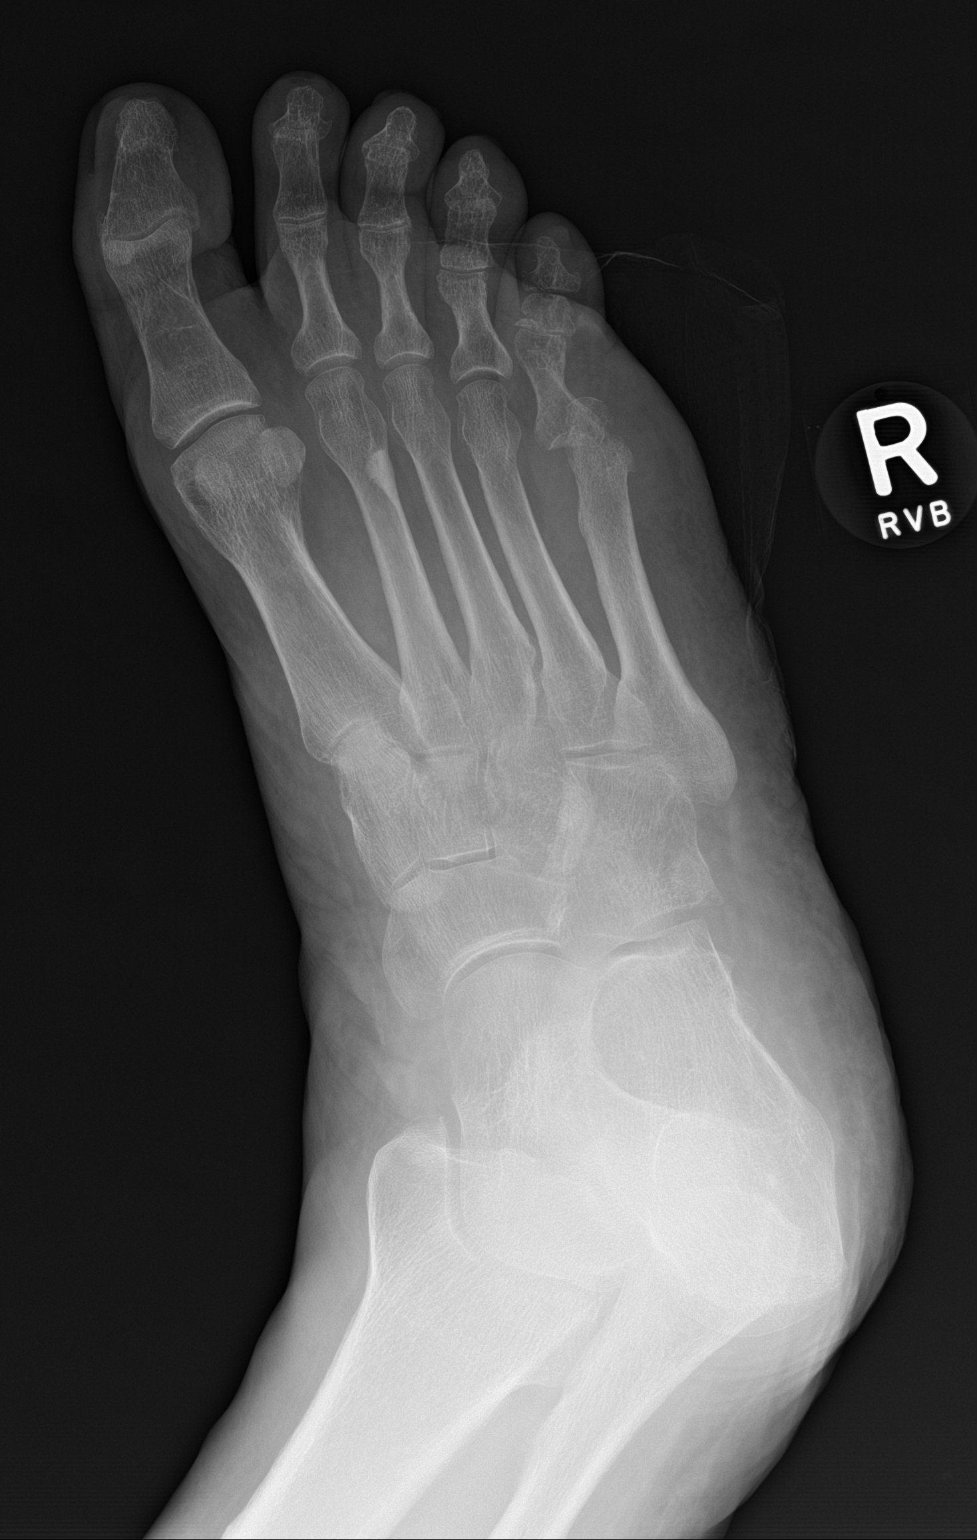

[foot lat]
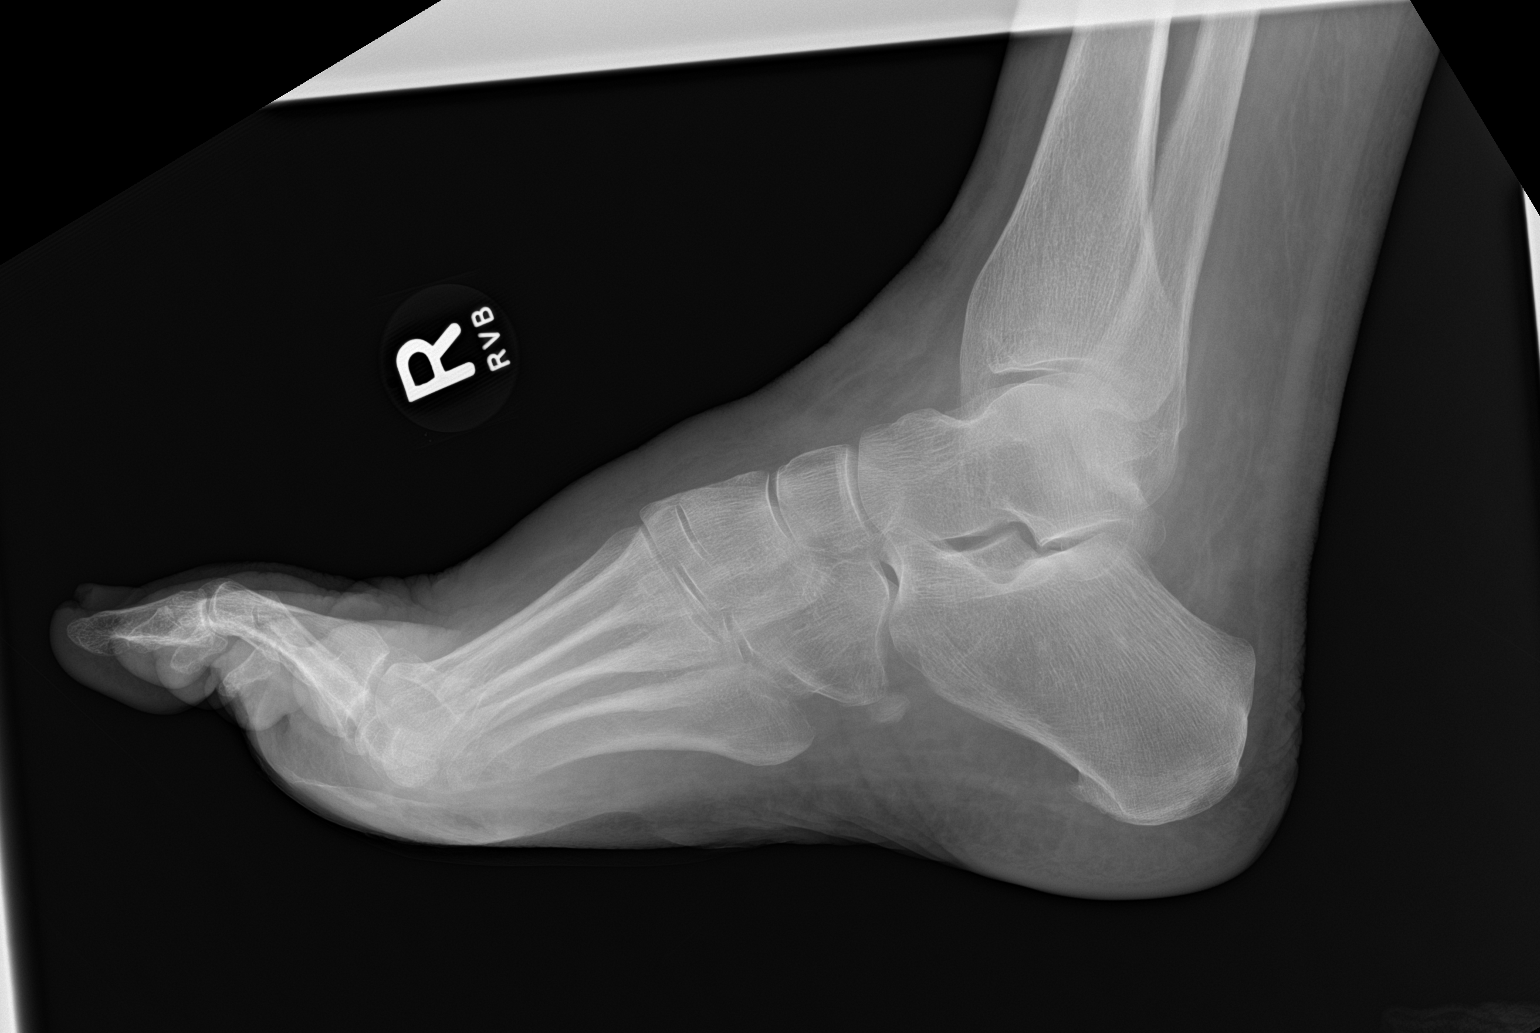

[3 of 3 positions shown; findings below may reference images not displayed]

FINDINGS: Mildly progressive bone destruction involving the fifth metatarsal
head. There is also dorsal dislocation of the fifth toe at the level
of the MTP joint. Associated soft tissue swelling. No soft tissue
gas. Stable bone island in the second metatarsal.
IMPRESSION: 1. Mildly progressive changes of osteomyelitis involving the fifth
metatarsal head.
2. Dorsal dislocation of the fifth toe at the MTP joint.

## 2017-04-18 ENCOUNTER — Other Ambulatory Visit: Payer: Medicare Other

## 2017-04-18 ENCOUNTER — Ambulatory Visit (INDEPENDENT_AMBULATORY_CARE_PROVIDER_SITE_OTHER): Payer: Medicare Other

## 2017-04-18 DIAGNOSIS — Z23 Encounter for immunization: Secondary | ICD-10-CM

## 2017-04-18 DIAGNOSIS — B20 Human immunodeficiency virus [HIV] disease: Secondary | ICD-10-CM

## 2017-04-18 LAB — CBC
HEMATOCRIT: 42.5 % (ref 38.5–50.0)
HEMOGLOBIN: 14.2 g/dL (ref 13.2–17.1)
MCH: 28.3 pg (ref 27.0–33.0)
MCHC: 33.4 g/dL (ref 32.0–36.0)
MCV: 84.8 fL (ref 80.0–100.0)
MPV: 9.7 fL (ref 7.5–12.5)
Platelets: 242 10*3/uL (ref 140–400)
RBC: 5.01 10*6/uL (ref 4.20–5.80)
RDW: 14.1 % (ref 11.0–15.0)
WBC: 9 10*3/uL (ref 3.8–10.8)

## 2017-04-18 LAB — COMPLETE METABOLIC PANEL WITH GFR
AG Ratio: 1.3 (calc) (ref 1.0–2.5)
ALBUMIN MSPROF: 4.2 g/dL (ref 3.6–5.1)
ALT: 5 U/L — ABNORMAL LOW (ref 9–46)
AST: 8 U/L — ABNORMAL LOW (ref 10–35)
Alkaline phosphatase (APISO): 105 U/L (ref 40–115)
BILIRUBIN TOTAL: 0.6 mg/dL (ref 0.2–1.2)
BUN: 11 mg/dL (ref 7–25)
CHLORIDE: 101 mmol/L (ref 98–110)
CO2: 25 mmol/L (ref 20–32)
Calcium: 9.8 mg/dL (ref 8.6–10.3)
Creat: 1.19 mg/dL (ref 0.70–1.33)
GFR, EST AFRICAN AMERICAN: 78 mL/min/{1.73_m2} (ref >=60)
GFR, EST NON AFRICAN AMERICAN: 67 mL/min/{1.73_m2} (ref >=60)
GLUCOSE: 86 mg/dL (ref 65–99)
Globulin: 3.3 g/dL (calc) (ref 1.9–3.7)
Potassium: 4.2 mmol/L (ref 3.5–5.3)
Sodium: 137 mmol/L (ref 135–146)
TOTAL PROTEIN: 7.5 g/dL (ref 6.1–8.1)

## 2017-04-19 LAB — T-HELPER CELL (CD4) - (RCID CLINIC ONLY)
CD4 % Helper T Cell: 9 % — ABNORMAL LOW (ref 33–55)
CD4 T Cell Abs: 130 /uL — ABNORMAL LOW (ref 400–2700)

## 2017-04-21 ENCOUNTER — Other Ambulatory Visit: Payer: Self-pay | Admitting: Infectious Diseases

## 2017-04-21 DIAGNOSIS — I10 Essential (primary) hypertension: Secondary | ICD-10-CM

## 2017-04-21 LAB — HIV-1 RNA QUANT-NO REFLEX-BLD
HIV 1 RNA Quant: 26 copies/mL — ABNORMAL HIGH
HIV-1 RNA QUANT, LOG: 1.41 {Log_copies}/mL — AB

## 2017-04-24 ENCOUNTER — Other Ambulatory Visit: Payer: Self-pay | Admitting: *Deleted

## 2017-04-24 DIAGNOSIS — G629 Polyneuropathy, unspecified: Secondary | ICD-10-CM

## 2017-04-24 MED ORDER — MORPHINE SULFATE ER 40 MG PO CP24: 40.0000 mg | ORAL_CAPSULE | Freq: Two times a day (BID) | ORAL | 0 refills | Status: DC

## 2017-04-28 ENCOUNTER — Other Ambulatory Visit: Payer: Self-pay | Admitting: Infectious Diseases

## 2017-04-28 DIAGNOSIS — I10 Essential (primary) hypertension: Secondary | ICD-10-CM

## 2017-04-28 NOTE — Telephone Encounter (Signed)
Are you prescribing Lasix for patient?

## 2017-05-08 ENCOUNTER — Other Ambulatory Visit: Payer: Self-pay

## 2017-05-08 DIAGNOSIS — I10 Essential (primary) hypertension: Secondary | ICD-10-CM

## 2017-05-08 MED ORDER — FUROSEMIDE 40 MG PO TABS: 20.0000 mg | ORAL_TABLET | Freq: Every day | ORAL | 3 refills | Status: DC

## 2017-05-09 ENCOUNTER — Other Ambulatory Visit: Payer: Self-pay | Admitting: *Deleted

## 2017-05-09 ENCOUNTER — Other Ambulatory Visit: Payer: Self-pay

## 2017-05-09 ENCOUNTER — Other Ambulatory Visit: Payer: Self-pay | Admitting: Pharmacy Technician

## 2017-05-09 NOTE — Patient Outreach (Signed)
[facility](THN) Care Management  05/09/2017  Doristine ChurchGary W [facility] [name] 13-Mar-1961 [id number]   Successful patient outreach call to [name]. HIPAA identifiers verified and verbal consent received. Patient was referred to me by Alessandra Groutamika Gravely, CMA in reference to patient being out of Furosemide and not being able to get his medication refilled by his specialist. The patient was under the impression that he had to get a PCP before he could get additional refills. After looking at his med list we were able to see that the medication was filled by Dr. Ninetta LightsHatcher yesterday and [facility]. I called the pharmacy to verify that the prescription was filled and to inquire about the cost. I followed-up with the patient to inform him that his medication is ready for pick up and it's free of charge.  [name], CPhT [facility] [phone]

## 2017-05-09 NOTE — Patient Outreach (Addendum)
[facility](THN) Care Management  05/09/2017  Doristine ChurchGary W [facility] 1960/12/03 [id number]   Telephone Screen  Referral Date: 05/09/17 Referral Source: Emmi Prevent Referral Reason: DM,COPD, Heart Disease Insurance: [facility]  Outreach attempt #1 to patient. No answer. RN CM left HIPAA compliant message along with contact info. RN CM contacted Patient Care Center to arrange a new patient appointment [phone] New patient appointment is scheduled for 05/12/17 at 0900.   RN CM contacted patient's Cardiologist regarding refilling patient's Lasix. Dr. Ninetta LightsHatcher gave orders to have patient's Lasix refilled on 05/08/17, per RN.   Plan: RN CM will contact patient with one week.Wynelle Cleveland.   Conita Amenta, RN, BSN, MHA/MSL, [facility] Telephonic Care Manager Coordinator [facility] Direct Phone: [phone] Toll Free: [phone] Fax: [phone]

## 2017-05-09 NOTE — Patient Outreach (Signed)
[facility] to [facility] services.  While engaging patient he discussed that he is trying to establish a PCP.  He is currently showing under the care of a Specialist.  He is needing to establish a PCP because he takes Lasix and the Specialist will not fill the prescription because his insurance is saying he needs a PCP.  He contacted Oliver BarreJames John, MD office but was told they would not accept him as a patient.  He is going on his 3rd or 4th day without his Lasix and is needing it as soon as possible.

## 2017-05-10 ENCOUNTER — Ambulatory Visit: Payer: Self-pay | Admitting: *Deleted

## 2017-05-10 ENCOUNTER — Other Ambulatory Visit: Payer: Self-pay | Admitting: *Deleted

## 2017-05-10 NOTE — Patient Outreach (Signed)
[facility]) Care Management  05/10/2017  [name] 12/18/1960 [id number]   Telephone Screen  Referral Date: 05/09/17 Referral Source: Emmi Prevent Referral Reason: DM,COPD, Heart Disease [facility]: [facility]  Outreach attempt #2 to patient. No answer. RN CM left HIPAA compliant message along with contact info.  Plan: RN CM will contact patient with one week.[name].   Fayola Meckes, RN, BSN, MHA/MSL, [facility] Telephonic Care Manager Coordinator [facility] Direct Phone: [phone] Toll Free: [phone] Fax: [phone]

## 2017-05-10 NOTE — Patient Outreach (Signed)
[facility](THN) Care Management  05/10/2017  Johnathan [facility]Gary W [facility] 08-18-1960 [id number]  Telephone Screen  Referral Date: 05/09/17 Referral Source: EMMI Prevent Referral Reason: COPD, Irregular heart rhythm, Atrial Fib, DM, Heart Disease, Heart Failure, O42 positive [facility]: [facility]  Outreach telephone to patient. HIPAA verified with patient. RN CM gave patient his new appointment information scheduled for 05/12/17 at 0900. Patient stated, he will keep this appointment with the Patient Care Center. Patient confirmed his medical history. He verbalized using Dr. Ninetta LightsHatcher as his primary MD, until he can find a primary MD. Patient has to find a doctor that will confirm his diagnosis of DM. Patient stated, he is receiving treatment from a Psychiatrist. He's had multiple issues and depression over the years. His sister recently passed away on 04/29/17. He has no significant support with his medical care, after his sister passed away. He has attempted to contact his niece, repeatedly, however he hasn't received a response. Patient wants to move from his current location. He stated, he is having difficulty with the moving process/money. Patient wants to know about available resources which may assist him with meals, housing, and possible dental care. [facility] services and benefits explained to patient. Patient agreed to services.   Plan: RN CM will send [facility] referral for possible assistance with community resources. RN CM advised patient to contact RNCM for any needs or concerns.   Wynelle ClevelandJuanita Mirelle Biskup, RN, BSN, MHA/MSL, [facility] Telephonic Care Manager Coordinator [facility] Direct Phone: [phone] Toll Free: [phone] Fax: [phone]

## 2017-05-11 ENCOUNTER — Other Ambulatory Visit: Payer: Self-pay | Admitting: *Deleted

## 2017-05-11 ENCOUNTER — Ambulatory Visit: Payer: Self-pay | Admitting: *Deleted

## 2017-05-11 ENCOUNTER — Encounter: Payer: Self-pay | Admitting: *Deleted

## 2017-05-11 NOTE — Patient Outreach (Signed)
[name](THN) Care Management  05/11/2017  Johnathan ChurchGary W [facility] [name] August 07, 1960 [id number]   CSW made an initial attempt to try and contact patient today to perform phone assessment, as well as assess and assist with social needs and services, without success.  A HIPAA compliant message was left for patient on voicemail.  CSW is currently awaiting a return call. CSW will make a second outreach attempt within the next week, if CSW does not receive a return call from patient in the meantime. Danford BadJoanna Saporito, BSW, MSW, LCSW  Licensed Restaurant manager, fast foodClinical Social Worker  [facility] Care Management [place] [facility]  Mailing [place], [place], [place] [address] Physical Address-[address], [place], [place] [address] Toll Free Main # [phone] Fax # [phone] Cell # [phone]  Office # [phone] [email or web address]

## 2017-05-12 ENCOUNTER — Encounter: Payer: Self-pay | Admitting: Family Medicine

## 2017-05-12 ENCOUNTER — Ambulatory Visit (HOSPITAL_COMMUNITY)
Admission: RE | Admit: 2017-05-12 | Discharge: 2017-05-12 | Disposition: A | Discharge status: Home / Self Care | Payer: Medicare Other | Source: Ambulatory Visit | Attending: Family Medicine | Admitting: Family Medicine

## 2017-05-12 ENCOUNTER — Ambulatory Visit (INDEPENDENT_AMBULATORY_CARE_PROVIDER_SITE_OTHER): Payer: Medicare Other | Admitting: Family Medicine

## 2017-05-12 VITALS — BP 90/62 | HR 79 | Temp 97.6°F | Resp 18 | Ht 74.0 in | Wt 189.0 lb

## 2017-05-12 DIAGNOSIS — R058 Other specified cough: Secondary | ICD-10-CM

## 2017-05-12 DIAGNOSIS — J441 Chronic obstructive pulmonary disease with (acute) exacerbation: Secondary | ICD-10-CM

## 2017-05-12 DIAGNOSIS — R000 Tachycardia, unspecified: Secondary | ICD-10-CM | POA: Diagnosis not present

## 2017-05-12 DIAGNOSIS — F331 Major depressive disorder, recurrent, moderate: Secondary | ICD-10-CM | POA: Diagnosis not present

## 2017-05-12 DIAGNOSIS — R05 Cough: Secondary | ICD-10-CM | POA: Diagnosis not present

## 2017-05-12 DIAGNOSIS — E1142 Type 2 diabetes mellitus with diabetic polyneuropathy: Secondary | ICD-10-CM

## 2017-05-12 DIAGNOSIS — R42 Dizziness and giddiness: Secondary | ICD-10-CM | POA: Diagnosis not present

## 2017-05-12 DIAGNOSIS — J8410 Pulmonary fibrosis, unspecified: Secondary | ICD-10-CM | POA: Insufficient documentation

## 2017-05-12 DIAGNOSIS — I952 Hypotension due to drugs: Secondary | ICD-10-CM

## 2017-05-12 DIAGNOSIS — I10 Essential (primary) hypertension: Secondary | ICD-10-CM

## 2017-05-12 LAB — BASIC METABOLIC PANEL WITH GFR
BUN: 17 mg/dL (ref 7–25)
CALCIUM: 9.5 mg/dL (ref 8.6–10.3)
CHLORIDE: 102 mmol/L (ref 98–110)
CO2: 20 mmol/L (ref 20–32)
Creat: 1.28 mg/dL (ref 0.70–1.33)
GFR, EST AFRICAN AMERICAN: 72 mL/min/{1.73_m2} (ref >=60)
GFR, Est Non African American: 62 mL/min/{1.73_m2} (ref >=60)
Glucose, Bld: 102 mg/dL — ABNORMAL HIGH (ref 65–99)
Potassium: 4.2 mmol/L (ref 3.5–5.3)
Sodium: 136 mmol/L (ref 135–146)

## 2017-05-12 LAB — POCT URINALYSIS DIP (DEVICE)
BILIRUBIN URINE: NEGATIVE
GLUCOSE, UA: NEGATIVE mg/dL
Hgb urine dipstick: NEGATIVE
Ketones, ur: NEGATIVE mg/dL
LEUKOCYTES UA: NEGATIVE
Nitrite: NEGATIVE
Protein, ur: NEGATIVE mg/dL
Specific Gravity, Urine: 1.02 (ref 1.005–1.030)
Urobilinogen, UA: 0.2 mg/dL (ref 0.0–1.0)
pH: 6 (ref 5.0–8.0)

## 2017-05-12 LAB — POCT GLYCOSYLATED HEMOGLOBIN (HGB A1C): Hemoglobin A1C: 5.3

## 2017-05-12 MED ORDER — METOPROLOL TARTRATE 25 MG PO TABS: ORAL_TABLET | ORAL | 5 refills | Status: DC

## 2017-05-12 MED ORDER — METOPROLOL TARTRATE 50 MG PO TABS: ORAL_TABLET | ORAL | 11 refills | Status: DC

## 2017-05-12 MED ORDER — ALBUTEROL SULFATE HFA 108 (90 BASE) MCG/ACT IN AERS: 2.0000 | INHALATION_SPRAY | Freq: Four times a day (QID) | RESPIRATORY_TRACT | 0 refills | Status: AC | PRN

## 2017-05-12 MED ORDER — FUROSEMIDE 20 MG PO TABS: 20.0000 mg | ORAL_TABLET | Freq: Every day | ORAL | 5 refills | Status: DC

## 2017-05-12 NOTE — Patient Instructions (Addendum)
Thanks for establishing care today. Will be unable to treat chronic pain. I am willing to send a referral to pain management. However, follow up with Dr. Ninetta LightsHatcher.  He will continue to follow-up with Dr. Ninetta LightsHatcher and schedule to discuss any laboratory values pertaining to your infectious disease status.  Continue antiviral medications as prescribed.  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  Blood pressure dropped significantly on standing, I will decrease metoprolol to 25 mg twice daily.  Follow-up in 2 weeks for blood pressure check and in 1 month for hypertension.  Will continue furosemide at 20 mg daily.  Reminded to be careful when transitioning from sitting to standing.  You have a history of COPD and are currently having a productive cough my recommendation is for you to have a stat chest x-ray.  The purpose of the chest x-ray is to rule out pneumonia or bronchitis or any acute lung abnormalities.  I will follow-up with you by phone after reviewing the x-ray results.  I will send a rescue inhaler called albuterol to your pharmacy.  The purpose of albuterol is for rescue and the infant's persistent coughing, wheezing, and or shortness of breath.  You can use 2 puffs every 6 hours as needed for those symptoms. Your hemoglobin A1c has improved to 5.3, your type 2 diabetes is controlled on diet and exercise.  You do not warrant any medications at this juncture. You will need to obtain your diabetic orthotics with level for orthotics on [address].  Damage from type 2 diabetes has been done earlier in your diagnosis so you will still need orthotics despite the fact that your hemoglobin A1c has improved to 5.3.  Continue to follow-up with psychiatry for major depressive disorder.  I will not make any medication changes today in terms of depression or anxiety.  Discussed your eye exam on yesterday continue to follow for close monitoring of your cataracts.  You will follow-up with me by phone after  reviewing the chest x-ray and then in 3 months for chronic conditions.  You are welcome to schedule appointments for any acute conditions that may come about. Chronic Obstructive Pulmonary Disease Chronic obstructive pulmonary disease (COPD) is a long-term (chronic) lung problem. When you have COPD, it is hard for air to get in and out of your lungs. The way your lungs work will never return to normal. Usually the condition gets worse over time. There are things you can do to keep yourself as healthy as possible. Your doctor may treat your condition with:  Medicines.  Quitting smoking, if you smoke.  Rehabilitation. This may involve a team of specialists.  Oxygen.  Exercise and changes to your diet.  Lung surgery.  Comfort measures (palliative care).  Follow these instructions at home: Medicines  Take over-the-counter and prescription medicines only as told by your doctor.  Talk to your doctor before taking any cough or allergy medicines. You may need to avoid medicines that cause your lungs to be dry. Lifestyle  If you smoke, stop. Smoking makes the problem worse. If you need help quitting, ask your doctor.  Avoid being around things that make your breathing worse. This may include smoke, chemicals, and fumes.  Stay active, but remember to also rest.  Learn and use tips on how to relax.  Make sure you get enough sleep. Most adults need at least 7 hours a night.  Eat healthy foods. Eat smaller meals more often. Rest before meals. Controlled breathing  Learn and use  tips on how to control your breathing as told by your doctor. Try: ? Breathing in (inhaling) through your nose for 1 second. Then, pucker your lips and breath out (exhale) through your lips for 2 seconds. ? Putting one hand on your belly (abdomen). Breathe in slowly through your nose for 1 second. Your hand on your belly should move out. Pucker your lips and breathe out slowly through your lips. Your hand on your  belly should move in as you breathe out. Controlled coughing  Learn and use controlled coughing to clear mucus from your lungs. The steps are: 1. Lean your head a little forward. 2. Breathe in deeply. 3. Try to hold your breath for 3 seconds. 4. Keep your mouth slightly open while coughing 2 times. 5. Spit any mucus out into a tissue. 6. Rest and do the steps again 1 or 2 times as needed. General instructions  Make sure you get all the shots (vaccines) that your doctor recommends. Ask your doctor about a flu shot and a pneumonia shot.  Use oxygen therapy and therapy to help improve your lungs (pulmonary rehabilitation) if told by your doctor. If you need home oxygen therapy, ask your doctor if you should buy a tool to measure your oxygen level (oximeter).  Make a COPD action plan with your doctor. This helps you know what to do if you feel worse than usual.  Manage any other conditions you have as told by your doctor.  Avoid going outside when it is very hot, cold, or humid.  Avoid people who have a sickness you can catch (contagious).  Keep all follow-up visits as told by your doctor. This is important. Contact a doctor if:  You cough up more mucus than usual.  There is a change in the color or thickness of the mucus.  It is harder to breathe than usual.  Your breathing is faster than usual.  You have trouble sleeping.  You need to use your medicines more often than usual.  You have trouble doing your normal activities such as getting dressed or walking around the house. Get help right away if:  You have shortness of breath while resting.  You have shortness of breath that stops you from: ? Being able to talk. ? Doing normal activities.  Your chest hurts for longer than 5 minutes.  Your skin color is more blue than usual.  Your pulse oximeter shows that you have low oxygen for longer than 5 minutes.  You have a fever.  You feel too tired to breathe  normally. Summary  Chronic obstructive pulmonary disease (COPD) is a long-term lung problem.  The way your lungs work will never return to normal. Usually the condition gets worse over time. There are things you can do to keep yourself as healthy as possible.  Take over-the-counter and prescription medicines only as told by your doctor.  If you smoke, stop. Smoking makes the problem worse. This information is not intended to replace advice given to you by your health care provider. Make sure you discuss any questions you have with your health care provider. Document Released: 12/07/2007 Document Revised: 11/26/2015 Document Reviewed: 02/14/2013 Elsevier Interactive Patient Education  2017 [employer]

## 2017-05-12 NOTE — Progress Notes (Signed)
Subjective:    Patient ID: [name], male    DOB: [birth year], 56 y.o.   MRN: [id number]  HPI [name], a 56 year old male with a history of HIV, type 2 diabetes, COPD, depression and venous insufficiency presents to establish care.  Patient has not had a primary provider but is followed by varying specialist.  He is followed by Dr. Johny Sax, infectious disease, for HIV.  Patient says that he has been taking all antiviral medications consistently.  He is not skipped any doses. Patient is complaining of chronic pain on today.  He states that pain is generalized he has been taking .  He has been taking MS Contin 40 mg extended release with maximum relief.  Patient is not under the care of pain management.  He states that he has been receiving medication from his infectious disease provider over the past several years. Patient has a history of type 2 diabetes.  He is not on antidiabetic medications at this time.  He states that he control his diabetes primarily with diet and remaining active.  He currently denies polyuria, polydipsia, or polyphagia.  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  He states that he is in the process of receiving orthopedic shoes to assist with this problem.  Patient also has a history of depression and anxiety.  He is under the care of Dr. Dewayne Shorter, psychiatry.  Patient takes all prescribed medications consistently.  He denies suicidal or homicidal ideations.  Patient also denies visual or auditory hallucinations.  Patient has a history of COPD.  He is a chronic, every day tobacco user.  He typically smokes 1/2 pack/day.  He is attempted to quit in the past without sustained success.  Dr. Babette Relic Past Medical History:  Diagnosis Date  . Cholesteatoma   . 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 COPD (chronic obstructive pulmonary disease) (HCC)   . Depression   . Folliculitis barbae 12/08/2014  . GERD (gastroesophageal reflux disease) 06/09/2015  . HIV (human immunodeficiency virus infection) (HCC)   . ILD (interstitial lung disease) (HCC) 12/08/2014  . Marfan syndrome    Dr Ninetta Lights follows  . Mitral valve prolapse   . Rhinosinusitis 12/08/2014  . Venous insufficiency (chronic) (peripheral)   . VT (ventricular tachycardia) (HCC)    Immunization History  Administered Date(s) Administered  . H1N1 05/02/2008  . Hepatitis A 02/20/2008, 12/10/2008  . Influenza Split 03/30/2012  . Influenza Whole 03/22/2006, 03/21/2007, 03/31/2008, 03/25/2009, 04/29/2010, 02/16/2011  . Influenza,inj,Quad PF,6+ Mos 03/12/2013, 03/12/2014, 03/24/2015, 04/13/2016, 04/18/2017  . Pneumococcal Polysaccharide-23 04/03/2005, 08/18/2010  . Tdap 07/16/2012, 01/09/2016    Review of Systems  Constitutional: Negative.   HENT: Negative.   Eyes: Negative for visual disturbance.  Respiratory: Positive for shortness of breath. Negative for apnea and chest tightness.   Endocrine: Positive for polyphagia.  Musculoskeletal: Positive for arthralgias.  Skin: Negative.   Neurological: Positive for dizziness and light-headedness. Negative for tremors, weakness and headaches.  Hematological: Negative.   Psychiatric/Behavioral: Negative.        Objective:   Physical Exam  Cardiovascular: Normal rate and regular rhythm.  Murmur heard. Pulses:      Carotid pulses are 2+ on the right side, and 2+ on the left side.      Radial pulses are 2+ on the right side, and  2+ on the left side.       Femoral pulses are 2+ on the right side, and 2+ on the left side.      Popliteal pulses are 2+ on the right side, and 2+ on the left side.       Dorsalis pedis pulses are 2+ on the right side, and 2+ on the left side.       Posterior tibial pulses are 2+ on the right side, and 2+ on the left side.  BP 90/62 Comment: manually   Pulse 79   Temp 97.6 F (36.4 C) (Oral)   Resp 18   Ht 6\' 2"  (1.88 m)   Wt 189 lb (85.7 kg)   SpO2 98%   BMI 24.27 kg/m   General Appearance:    Alert, cooperative, mild distress, appears stated age  Head:    Normocephalic, without obvious abnormality, atraumatic  Eyes:    PERRL, conjunctiva/corneas clear, EOM's intact, fundi    benign, both eyes       Ears:    Normal TM's and external ear canals, both ears  Nose:   Nares normal, septum midline, mucosa normal, no drainage   or sinus tenderness  Throat:   Lips, mucosa, and tongue normal; teeth and gums normal  Neck:   Supple, symmetrical, trachea midline, no adenopathy;       thyroid:  No enlargement/tenderness/nodules; no carotid   bruit or JVD  Back:     Symmetric, no curvature, ROM normal, no CVA tenderness  Lungs:     Clear to auscultation bilaterally, respirations unlabored  Chest wall:    No tenderness or deformity  Heart:    Regular rate and rhythm, S1 and S2 normal, Murmur present, rub or gallop  Abdomen:     Soft, non-tender, bowel sounds active all four quadrants,    no masses, no organomegaly  Extremities:   Extremities normal, atraumatic, no cyanosis or edema  Pulses:   2+ and symmetric all extremities  Skin:   Skin color, texture, turgor normal, no rashes or lesions  Lymph nodes:   Cervical, supraclavicular, and axillary nodes normal  Neurologic:   CNII-XII intact. Normal strength, sensation and reflexes      throughout    Physical Exam  Cardiovascular: Normal rate and regular rhythm.  Murmur heard. Pulses:      Carotid pulses are 2+ on the right side, and 2+ on the left side.      Radial pulses are 2+ on the right side, and 2+ on the left side.       Femoral pulses are 2+ on the right side, and 2+ on the left side.      Popliteal pulses are 2+ on the right side, and 2+ on the left side.       Dorsalis pedis pulses are 2+ on the right side, and 2+ on the left side.       Posterior tibial pulses are 2+ on the  right side, and 2+ on the left side.       BP 115/77 (BP Location: Left Arm, Patient Position: Sitting, Cuff Size: Normal)   Pulse 79   Temp 97.6 F (36.4 C) (Oral)   Resp 18   Ht 6\' 2"  (1.88 m)   Wt 189 lb (85.7 kg)   SpO2 98%   BMI 24.27 kg/m  Assessment & Plan:  1. Type 2 diabetes, controlled, with peripheral neuropathy (HCC) Patient has a history of type 2 diabetes, current hemoglobin A1c  is 5.3.  Diabetes has resolved with current diet and exercise, no medications recommended at this time. - HgB A1c - BASIC METABOLIC PANEL WITH GFR - POCT urinalysis dip (device)  2. Cough with sputum Patient has had a productive cough with sputum for greater than 1 month.  He typically smokes up to 1 pack of cigarettes per day.  Will review chest x-ray and follow-up with patient by phone with results - DG Chest 2 View; Future  3. COPD exacerbation (HCC) Patient has a history of COPD, he currently does not have a rescue inhaler.  Will start albuterol 2 puffs every 6 hours as needed for wheezing coughing and/or shortness of breath. - albuterol (PROVENTIL HFA;VENTOLIN HFA) 108 (90 Base) MCG/ACT inhaler; Inhale 2 puffs every 6 (six) hours as needed into the lungs for wheezing or shortness of breath.  Dispense: 1 Inhaler; Refill: 0  4. Moderate episode of recurrent major depressive disorder [facility](HCC) Patient is followed by psychiatry for episodes of major  depressive disorder.  He is taking medications consistently.  He denies current suicidal or homicidal intent.  He also denies visual or auditory hallucinations.  5. Tachycardia Patient has a history of tachycardia and was taking metoprolol 50 mg in a.m. and p.m., today patient is bradycardic we will decrease metoprolol to 25 mg twice daily patient is to follow-up in 1 week for blood pressure and heart rate check. - Orthostatic vital signs - metoprolol tartrate (LOPRESSOR) 25 MG tablet; 25 mg in am and 25 pm  Dispense: 60 tablet; Refill: 5  6.  Essential hypertension Blood pressure is at goal on current medication regimen. - furosemide (LASIX) 20 MG tablet; Take 1 tablet (20 mg total) daily by mouth.  Dispense: 30 tablet; Refill: 5 - POCT urinalysis dip (device)  7. Dizziness on standing Patient has dizziness on standing, which is attributed to high dose of beta-blocker.  Will reduce metoprolol to 25 mg twice daily and recheck vital signs in 1 week.  8. Hypotension due to drugs Refer to #7  9.  HIV (human immunodeficiency virus) Patient is to continue to follow-up with Dr. Johny SaxJeffrey Hatcher, infectious disease.  Encouraged to take medications consistently   Johnathan NationsLaChina Moore Lyal Husted  MSN, FNP-C Patient Care [facility] [address]  [place], [place] [address] [phone]   The patient was given clear instructions to go to ER or return to medical center if symptoms do not improve, worsen or new problems develop. The patient verbalized understanding. Will notify patient with laboratory results.

## 2017-05-12 NOTE — Progress Notes (Signed)
88/58 

## 2017-05-15 ENCOUNTER — Encounter: Payer: Self-pay | Admitting: Family Medicine

## 2017-05-15 NOTE — Progress Notes (Unsigned)
Johnathan CordsGary [name], a 56 year old male with a history of HIV and COPD presented complaining of a persistent cough on May 12, 2017.  Patient was sent for a chest x-ray.  Chest x-ray is consistent with bullous disease which is most likely air pockets that formed in the lung with there is severe tissue damage due to progressive emphysema or COPD.  Patient warrants a referral to pulmonology for further workup and evaluation.  We will send that referral on today.  Smoking cessation continues to be encouraged with this patient.  We will follow-up in office as previously scheduled.  [name]  MSN, FNP-C Patient Care [facility] [address]  [place], [place] [address] [phone]

## 2017-05-16 ENCOUNTER — Other Ambulatory Visit: Payer: Self-pay | Admitting: *Deleted

## 2017-05-16 ENCOUNTER — Telehealth: Payer: Self-pay

## 2017-05-16 NOTE — Telephone Encounter (Signed)
Patient returned call. I advised that xray was consistent with bullous disease. Advised that we will send to pulmonology for further workup. Patient verbalized understanding. Thanks!

## 2017-05-16 NOTE — Telephone Encounter (Signed)
-----   Message from Massie MaroonLachina M Hollis, [place]NP sent at 05/15/2017  8:19 AM EST ----- Regarding: xray results Please inform patient that chest x-ray is consistent with bullous disease.  Bullous disease occurs when air pockets form in the lungs with there is severe tissue damage due to COPD/emphysema.  Recommend that patient decrease smoking.  Also he wants a referral to pulmonology for further workup and evaluation.  Pulmonology will give him a call to schedule appointment. Thanks

## 2017-05-16 NOTE — Patient Outreach (Signed)
[facility] Care Management  05/16/2017  Johnathan ChurchGary W [name] September 08, 1960 [id number]   CSW made a second attempt to try and contact patient today to perform phone assessment, as well as assess and assist with social needs and services, without success.  A HIPAA compliant message was left for patient on voicemail.  CSW is currently awaiting a return call. CSW will make a third and final outreach attempt within the next week, if CSW does not receive a return call from patient in the meantime. [name], BSW, MSW, LCSW  Licensed Restaurant manager, fast foodClinical Social Worker  [facility] Care Management [facility]  Mailing [place], [place], [place] [address] Physical Address-[address], [place], [place] [address] Toll Free Main # [phone] Fax # [phone] Cell # [phone]  Office # [phone] [email or web address]

## 2017-05-16 NOTE — Telephone Encounter (Signed)
Called no answer. Left a message to call back. Thanks!  

## 2017-05-17 ENCOUNTER — Other Ambulatory Visit: Payer: Self-pay | Admitting: Family Medicine

## 2017-05-17 ENCOUNTER — Other Ambulatory Visit: Payer: Self-pay | Admitting: *Deleted

## 2017-05-17 ENCOUNTER — Ambulatory Visit: Payer: Medicare Other | Admitting: Infectious Diseases

## 2017-05-17 VITALS — BP 108/74 | HR 75 | Temp 97.5°F | Wt 186.0 lb

## 2017-05-17 DIAGNOSIS — F17200 Nicotine dependence, unspecified, uncomplicated: Secondary | ICD-10-CM | POA: Diagnosis not present

## 2017-05-17 DIAGNOSIS — J449 Chronic obstructive pulmonary disease, unspecified: Secondary | ICD-10-CM

## 2017-05-17 DIAGNOSIS — Z113 Encounter for screening for infections with a predominantly sexual mode of transmission: Secondary | ICD-10-CM | POA: Diagnosis not present

## 2017-05-17 DIAGNOSIS — B20 Human immunodeficiency virus [HIV] disease: Secondary | ICD-10-CM | POA: Diagnosis not present

## 2017-05-17 DIAGNOSIS — F331 Major depressive disorder, recurrent, moderate: Secondary | ICD-10-CM | POA: Diagnosis not present

## 2017-05-17 DIAGNOSIS — Z79899 Other long term (current) drug therapy: Secondary | ICD-10-CM | POA: Diagnosis not present

## 2017-05-17 DIAGNOSIS — E1142 Type 2 diabetes mellitus with diabetic polyneuropathy: Secondary | ICD-10-CM | POA: Diagnosis not present

## 2017-05-17 DIAGNOSIS — L97511 Non-pressure chronic ulcer of other part of right foot limited to breakdown of skin: Secondary | ICD-10-CM | POA: Diagnosis not present

## 2017-05-17 NOTE — Assessment & Plan Note (Signed)
He appears to be doing well with re to his HIV Await his pulm eval He also needs GI eval with his hx of pills getting stuck in his throat recently He has gotten flu shot.  Will see him back in 6 months.

## 2017-05-17 NOTE — Assessment & Plan Note (Signed)
I encouraged him to quit,

## 2017-05-17 NOTE — Assessment & Plan Note (Signed)
He will continue to f/u with psychiatry.

## 2017-05-17 NOTE — Patient Outreach (Signed)
[facility]) Care Management  05/17/2017  Doristine ChurchGary W [facility] Jan 12, 1961 [id number]   CSW made a third and final attempt to try and contact patient today to perform phone assessment, as well as assess and assist with social work needs and services, without success.  A HIPAA compliant message was left for patient on voicemail.  CSW  continues to await a return call.  CSW will mail an outreach letter to patient's home, encouraging patient to contact CSW at their earliest convenience, if patient is interested in receiving social work services through CSW with [facility].  If CSW does not receive a return call from patient within the next 10 business days, CSW will proceed with case closure.  Required number of phone attempts will have been made and outreach letter mailed.   Danford BadJoanna Tahmid Stonehocker, BSW, MSW, LCSW  Licensed Restaurant manager, fast foodClinical Social Worker  [facility] Care Management [facility]  Mailing [place], [place], [place] [address] Physical Address-[address], [place], [place] [address] Toll Free Main # [phone] Fax # [phone] Cell # [phone]  Office # [phone] [email or web address]

## 2017-05-17 NOTE — Progress Notes (Signed)
   Subjective:    Patient ID: Johnathan LarsenGary W Scifres, male    DOB: [birth year], 56 y.o.   MRN: [id number]  HPI 56yo M with neuropathy, HIV/AIDS, DM2 (diet controlled), suspected Marfan's syndrome, and COPD. He was started on complera 2012. Then changed to DTGV/Descovy 06-2015. Was in hospital 09-2013 with sustained VT. His cath was normal.  His last Echo was 06-06-2016:  - Left ventricle: The cavity size was mildly dilated. Systolic function was normal. The estimated ejection fraction was in the range of 50% to 55%. Akinesis of the basal-midinferior myocardium. - Mitral valve: The findings are consistent with mild stenosis. There was mild to moderate regurgitation. - Left atrium: The atrium was mildly to moderately dilated  06-2016 he had MSSA bacteremia related to a R fifth toe ulcer. His TEE was (-). He underwent amputation.  Recently seen by optho and was told he has cataracts.  He continues to f/u with psychiatry.  He went back to smoking cigarettes after e-cig were too expensive. He has COPD. He had CXR chest 11-9 which showed bullous changes. He is being referred to pulmonary. He is worried that he will "suffocate to death".   Review of Systems  Constitutional: Positive for unexpected weight change. Negative for appetite change, chills and fever.  HENT: Positive for trouble swallowing.   Respiratory: Positive for cough and shortness of breath.   Cardiovascular: Negative for chest pain and leg swelling.  Gastrointestinal: Negative for constipation and diarrhea.  Genitourinary: Negative for difficulty urinating.  Wt down 30#.  Does not check FSG Please see HPI. All other systems reviewed and negative.     Objective:   Physical Exam  Constitutional: He appears well-developed and well-nourished.  HENT:  Mouth/Throat: No oropharyngeal exudate.  Eyes: EOM are normal. Pupils are equal, round, and reactive to light.  Neck: Neck supple.  Cardiovascular: Normal rate and regular  rhythm.  Murmur heard. Pulmonary/Chest: No respiratory distress. He has decreased breath sounds.  Abdominal: Soft. Bowel sounds are normal. There is no tenderness. There is no rebound.  Musculoskeletal: He exhibits no edema.  Lymphadenopathy:    He has no cervical adenopathy.  Psychiatric: He has a normal mood and affect.      Assessment & Plan:

## 2017-05-17 NOTE — Assessment & Plan Note (Addendum)
Could consider getting him a new monitor/supplies.  Will cc PCP

## 2017-05-17 NOTE — Assessment & Plan Note (Signed)
He has no foot lesions but does have onychomycosis Will get him in with podiatry.

## 2017-05-18 ENCOUNTER — Encounter: Payer: Self-pay | Admitting: *Deleted

## 2017-05-19 ENCOUNTER — Ambulatory Visit: Payer: Medicare Other

## 2017-05-19 ENCOUNTER — Other Ambulatory Visit: Payer: Self-pay | Admitting: Infectious Diseases

## 2017-05-19 VITALS — BP 108/64 | HR 72

## 2017-05-19 DIAGNOSIS — Z0130 Encounter for examination of blood pressure without abnormal findings: Secondary | ICD-10-CM

## 2017-05-19 DIAGNOSIS — B20 Human immunodeficiency virus [HIV] disease: Secondary | ICD-10-CM

## 2017-05-22 ENCOUNTER — Telehealth: Payer: Self-pay

## 2017-05-22 DIAGNOSIS — G629 Polyneuropathy, unspecified: Secondary | ICD-10-CM

## 2017-05-22 MED ORDER — MORPHINE SULFATE ER 40 MG PO CP24: 40.0000 mg | ORAL_CAPSULE | Freq: Two times a day (BID) | ORAL | 0 refills | Status: DC

## 2017-05-22 NOTE — Telephone Encounter (Signed)
Patient calling for morphine sulfate refill . Script printed.

## 2017-05-31 ENCOUNTER — Other Ambulatory Visit: Payer: Self-pay | Admitting: *Deleted

## 2017-05-31 ENCOUNTER — Encounter: Payer: Self-pay | Admitting: *Deleted

## 2017-05-31 NOTE — Patient Outreach (Signed)
[facility]) Care Management  05/31/2017  Doristine ChurchGary W [facility] April 23, 1961 [id number]   CSW will perform a case closure on patient, due to inability to establish initial phone contact, despite required number of phone attempts made and outreach letter mailed to patient's home.  CSW will fax an update to patient's Primary Care Physician, Dr. Julianne HandlerLachina Hollis to ensure that they are aware of CSW's involvement with patient's plan of care.  CSW will submit a case closure request to Lucia Gaskinseresa Elkins, Care Management Assistant with [facility] Care Management, in the form of an In [employer]Basket message.   Danford BadJoanna Aneli Zara, BSW, MSW, LCSW  Licensed Restaurant manager, fast foodClinical Social Worker  [facility] Care Management [facility]  Mailing [place]-[address], [place], [place] [address] Physical Address-[address], [place], [place] [address] Toll Free Main # [phone] Fax # [phone] Cell # [phone]  Office # [phone] [email or web address]

## 2017-06-01 ENCOUNTER — Ambulatory Visit: Payer: Self-pay | Admitting: Family Medicine

## 2017-06-09 ENCOUNTER — Encounter: Payer: Self-pay | Admitting: Family Medicine

## 2017-06-09 ENCOUNTER — Ambulatory Visit (INDEPENDENT_AMBULATORY_CARE_PROVIDER_SITE_OTHER): Payer: Medicare Other | Admitting: Family Medicine

## 2017-06-09 VITALS — BP 98/62 | HR 78 | Temp 98.2°F | Resp 18 | Ht 74.0 in | Wt 185.0 lb

## 2017-06-09 DIAGNOSIS — R000 Tachycardia, unspecified: Secondary | ICD-10-CM

## 2017-06-09 DIAGNOSIS — F17200 Nicotine dependence, unspecified, uncomplicated: Secondary | ICD-10-CM

## 2017-06-09 DIAGNOSIS — I952 Hypotension due to drugs: Secondary | ICD-10-CM | POA: Diagnosis not present

## 2017-06-09 NOTE — Patient Instructions (Signed)
Continue Metoprolol 25 mg twice daily as previously prescribed.   Will send Mucinex extended release to pharmacy for persistent.    Cough, Adult A cough helps to clear your throat and lungs. A cough may last only 2-3 weeks (acute), or it may last longer than 8 weeks (chronic). Many different things can cause a cough. A cough may be a sign of an illness or another medical condition. Follow these instructions at home:  Pay attention to any changes in your cough.  Take medicines only as told by your doctor. ? If you were prescribed an antibiotic medicine, take it as told by your doctor. Do not stop taking it even if you start to feel better. ? Talk with your doctor before you try using a cough medicine.  Drink enough fluid to keep your pee (urine) clear or pale yellow.  If the air is dry, use a cold steam vaporizer or humidifier in your home.  Stay away from things that make you cough at work or at home.  If your cough is worse at night, try using extra pillows to raise your head up higher while you sleep.  Do not smoke, and try not to be around smoke. If you need help quitting, ask your doctor.  Do not have caffeine.  Do not drink alcohol.  Rest as needed. Contact a doctor if:  You have new problems (symptoms).  You cough up yellow fluid (pus).  Your cough does not get better after 2-3 weeks, or your cough gets worse.  Medicine does not help your cough and you are not sleeping well.  You have pain that gets worse or pain that is not helped with medicine.  You have a fever.  You are losing weight and you do not know why.  You have night sweats. Get help right away if:  You cough up blood.  You have trouble breathing.  Your heartbeat is very fast. This information is not intended to replace advice given to you by your health care provider. Make sure you discuss any questions you have with your health care provider. Document Released: 03/03/2011 Document Revised:  11/26/2015 Document Reviewed: 08/27/2014 Elsevier Interactive Patient Education  [employer]2018 Elsevier Inc.

## 2017-06-09 NOTE — Progress Notes (Signed)
Subjective:    Patient ID: [name], male    DOB: 1960-10-10, 56 y.o.   MRN: [id number]  HPI [name], a 56 year old male with a history of HIV, type 2 diabetes, COPD, depression and venous insufficiency presents for a follow up of hypotension. Patient established care on 05/12/2017 and presented with hypotension and dizziness. Metoprolol was decreased to 25 mg in am and 25 pm. Patient says that dizziness has subsided. Patient is scheduled to follow up with cardiologist in 6 months and echocardiogram is scheduled to be repeated at that time.   Patient has a history of COPD.  He is a chronic, every day tobacco user.  He typically smokes 1/2 pack/day.  He is attempted to quit in the past without sustained success.  Dr. Babette RelicGerald Poloski Past Medical History:  Diagnosis Date  . Cholesteatoma   . 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 COPD (chronic obstructive pulmonary disease) (HCC)   . Depression   . Folliculitis barbae 12/08/2014  . GERD (gastroesophageal reflux disease) 06/09/2015  . HIV (human immunodeficiency virus infection) (HCC)   . ILD (interstitial lung disease) (HCC) 12/08/2014  . Marfan syndrome    Dr Ninetta LightsHatcher follows  . Mitral valve prolapse   . Rhinosinusitis 12/08/2014  . Venous insufficiency (chronic) (peripheral)   . VT (ventricular tachycardia) (HCC)    Immunization History  Administered Date(s) Administered  . H1N1 05/02/2008  . Hepatitis A 02/20/2008, 12/10/2008  . Influenza Split 03/30/2012  . Influenza Whole 03/22/2006, 03/21/2007, 03/31/2008, 03/25/2009, 04/29/2010, 02/16/2011  . Influenza,inj,Quad PF,6+ Mos 03/12/2013, 03/12/2014, 03/24/2015, 04/13/2016, 04/18/2017  . Pneumococcal Polysaccharide-23 04/03/2005, 08/18/2010  . Tdap 07/16/2012, 01/09/2016    Review of Systems  Constitutional: Negative.   HENT: Negative.   Eyes: Negative for visual disturbance.  Respiratory: Positive for shortness of breath. Negative for apnea  and chest tightness.   Endocrine: Positive for polyphagia.  Musculoskeletal: Positive for arthralgias.  Skin: Negative.   Neurological: Positive for dizziness and light-headedness. Negative for tremors, weakness and headaches.  Hematological: Negative.   Psychiatric/Behavioral: Negative.        Objective:   Physical Exam  Cardiovascular: Normal rate and regular rhythm.  Murmur heard. Pulses:      Carotid pulses are 2+ on the right side, and 2+ on the left side.      Radial pulses are 2+ on the right side, and 2+ on the left side.       Femoral pulses are 2+ on the right side, and 2+ on the left side.      Popliteal pulses are 2+ on the right side, and 2+ on the left side.       Dorsalis pedis pulses are 2+ on the right side, and 2+ on the left side.       Posterior tibial pulses are 2+ on the right side, and 2+ on the left side.  BP 104/72 (BP Location: Right Arm, Patient Position: Sitting, Cuff Size: Normal)   Pulse 81   Temp 98.2 F (36.8 C) (Oral)   Resp 18   Ht 6\' 2"  (1.88 m)   Wt 185 lb (83.9 kg)   SpO2 92%   BMI 23.75 kg/m   General Appearance:    Alert, cooperative, mild distress, appears stated age  Head:    Normocephalic, without obvious abnormality, atraumatic  Eyes:    PERRL, conjunctiva/corneas clear, EOM's intact, fundi    benign, both eyes       Ears:  Normal TM's and external ear canals, both ears  Nose:   Nares normal, septum midline, mucosa normal, no drainage   or sinus tenderness  Throat:   Lips, mucosa, and tongue normal; teeth and gums normal  Neck:   Supple, symmetrical, trachea midline, no adenopathy;       thyroid:  No enlargement/tenderness/nodules; no carotid   bruit or JVD  Back:     Symmetric, no curvature, ROM normal, no CVA tenderness  Lungs:     Clear to auscultation bilaterally, respirations unlabored  Chest wall:    No tenderness or deformity  Heart:    Regular rate and rhythm, S1 and S2 normal, Murmur present, rub or gallop  Abdomen:      Soft, non-tender, bowel sounds active all four quadrants,    no masses, no organomegaly  Extremities:   Extremities normal, atraumatic, no cyanosis or edema  Pulses:   2+ and symmetric all extremities  Skin:   Skin color, texture, turgor normal, no rashes or lesions  Lymph nodes:   Cervical, supraclavicular, and axillary nodes normal  Neurologic:   CNII-XII intact. Normal strength, sensation and reflexes      throughout    Physical Exam  Cardiovascular: Normal rate and regular rhythm.  Murmur heard. Pulses:      Carotid pulses are 2+ on the right side, and 2+ on the left side.      Radial pulses are 2+ on the right side, and 2+ on the left side.       Femoral pulses are 2+ on the right side, and 2+ on the left side.      Popliteal pulses are 2+ on the right side, and 2+ on the left side.       Dorsalis pedis pulses are 2+ on the right side, and 2+ on the left side.       Posterior tibial pulses are 2+ on the right side, and 2+ on the left side.       BP 104/72 (BP Location: Right Arm, Patient Position: Sitting, Cuff Size: Normal)   Pulse 81   Temp 98.2 F (36.8 C) (Oral)   Resp 18   Ht 6\' 2"  (1.88 m)   Wt 185 lb (83.9 kg)   SpO2 92%   BMI 23.75 kg/m  Assessment & Plan:   Hypotension due to drugs Symptoms have improved since decreasing beta blocker. Will decrease medication further to 25 mg in am and 12.5 mg pm.  - metoprolol tartrate (LOPRESSOR) 25 MG tablet; 25 mg in am and 12.5 pm  Dispense: 60 tablet; Refill: 5  Tobacco dependence Smoking cessation instruction/counseling given:  counseled patient on the dangers of tobacco use, advised patient to stop smoking, and reviewed strategies to maximize success  Tachycardia - metoprolol tartrate (LOPRESSOR) 25 MG tablet; 25 mg in am and 12.5 pm  Dispense: 60 tablet; Refill: 5  RTC: 3 months for chronic conditions  Nolon NationsLachina Moore Eldena Dede  MSN, FNP-C Patient Care [facility] [address]   [place], [place] [address] [phone]

## 2017-06-09 NOTE — Progress Notes (Signed)
98 [phone]  

## 2017-06-13 MED ORDER — METOPROLOL TARTRATE 25 MG PO TABS: ORAL_TABLET | ORAL | 5 refills | Status: DC

## 2017-06-19 ENCOUNTER — Other Ambulatory Visit: Payer: Self-pay | Admitting: Podiatry

## 2017-06-19 ENCOUNTER — Other Ambulatory Visit: Payer: Self-pay | Admitting: *Deleted

## 2017-06-19 DIAGNOSIS — L98491 Non-pressure chronic ulcer of skin of other sites limited to breakdown of skin: Secondary | ICD-10-CM

## 2017-06-19 DIAGNOSIS — G629 Polyneuropathy, unspecified: Secondary | ICD-10-CM

## 2017-06-19 MED ORDER — MORPHINE SULFATE ER 40 MG PO CP24: 40.0000 mg | ORAL_CAPSULE | Freq: Two times a day (BID) | ORAL | 0 refills | Status: DC

## 2017-06-20 ENCOUNTER — Institutional Professional Consult (permissible substitution): Payer: Self-pay | Admitting: Internal Medicine

## 2017-06-20 ENCOUNTER — Telehealth: Payer: Self-pay | Admitting: *Deleted

## 2017-06-20 NOTE — Telephone Encounter (Signed)
Left message informing pt I had received a refill request for silvadene cream and that if he had an ulcer or wound that we would need to see him this week, because he had not be evaluated in our office since 08/2016.

## 2017-06-23 ENCOUNTER — Institutional Professional Consult (permissible substitution): Payer: Self-pay | Admitting: Internal Medicine

## 2017-07-03 ENCOUNTER — Ambulatory Visit: Payer: Medicare Other | Admitting: Podiatry

## 2017-07-05 ENCOUNTER — Ambulatory Visit: Payer: Medicare Other

## 2017-07-05 ENCOUNTER — Ambulatory Visit: Payer: Medicare Other | Admitting: Podiatry

## 2017-07-11 NOTE — Telephone Encounter (Signed)
[facility] Pharmacist notified and given PCP name, Julianne HandlerLachina Hollis. Wendall MolaJacqueline Cristoval Teall CMA

## 2017-07-11 NOTE — Telephone Encounter (Signed)
That should be his pcp

## 2017-07-18 ENCOUNTER — Institutional Professional Consult (permissible substitution): Payer: Self-pay | Admitting: Internal Medicine

## 2017-07-24 ENCOUNTER — Other Ambulatory Visit: Payer: Self-pay | Admitting: *Deleted

## 2017-07-24 DIAGNOSIS — G629 Polyneuropathy, unspecified: Secondary | ICD-10-CM

## 2017-07-25 ENCOUNTER — Other Ambulatory Visit: Payer: Self-pay | Admitting: *Deleted

## 2017-07-25 DIAGNOSIS — G629 Polyneuropathy, unspecified: Secondary | ICD-10-CM

## 2017-07-25 MED ORDER — MORPHINE SULFATE ER 40 MG PO CP24: 40.0000 mg | ORAL_CAPSULE | Freq: Two times a day (BID) | ORAL | 0 refills | Status: DC

## 2017-07-25 NOTE — Progress Notes (Signed)
Pain prescription printed at patient's request. Andree CossHowell, Eurydice Calixto M, RN

## 2017-08-22 ENCOUNTER — Other Ambulatory Visit: Payer: Self-pay | Admitting: *Deleted

## 2017-08-22 DIAGNOSIS — G629 Polyneuropathy, unspecified: Secondary | ICD-10-CM

## 2017-08-22 MED ORDER — MORPHINE SULFATE ER 40 MG PO CP24: 40.0000 mg | ORAL_CAPSULE | Freq: Two times a day (BID) | ORAL | 0 refills | Status: DC

## 2017-08-24 ENCOUNTER — Other Ambulatory Visit: Payer: Self-pay

## 2017-08-24 ENCOUNTER — Other Ambulatory Visit: Payer: Self-pay | Admitting: *Deleted

## 2017-08-24 DIAGNOSIS — Z79899 Other long term (current) drug therapy: Secondary | ICD-10-CM | POA: Diagnosis not present

## 2017-08-24 DIAGNOSIS — Z0289 Encounter for other administrative examinations: Secondary | ICD-10-CM

## 2017-08-25 LAB — PAIN MGMT, PROFILE 1 W/O CONF, U
AMPHETAMINES: NEGATIVE ng/mL (ref <500)
BARBITURATES: NEGATIVE ng/mL (ref <300)
BENZODIAZEPINES: POSITIVE ng/mL — AB (ref <100)
Cocaine Metabolite: NEGATIVE ng/mL (ref <150)
Creatinine: 92.8 mg/dL
MARIJUANA METABOLITE: NEGATIVE ng/mL (ref <20)
Methadone Metabolite: NEGATIVE ng/mL (ref <100)
OXYCODONE: NEGATIVE ng/mL (ref <100)
Opiates: POSITIVE ng/mL — AB (ref <100)
Oxidant: NEGATIVE ug/mL (ref <200)
Phencyclidine: NEGATIVE ng/mL (ref <25)
pH: 7.03 (ref 4.5–9.0)

## 2017-09-12 ENCOUNTER — Encounter: Payer: Self-pay | Admitting: Internal Medicine

## 2017-09-12 ENCOUNTER — Ambulatory Visit (INDEPENDENT_AMBULATORY_CARE_PROVIDER_SITE_OTHER): Payer: Medicare Other | Admitting: Internal Medicine

## 2017-09-12 VITALS — BP 102/60 | HR 88 | Ht 74.0 in | Wt 196.0 lb

## 2017-09-12 DIAGNOSIS — J449 Chronic obstructive pulmonary disease, unspecified: Secondary | ICD-10-CM | POA: Insufficient documentation

## 2017-09-12 DIAGNOSIS — F17210 Nicotine dependence, cigarettes, uncomplicated: Secondary | ICD-10-CM | POA: Diagnosis not present

## 2017-09-12 MED ORDER — GLYCOPYRROLATE-FORMOTEROL 9-4.8 MCG/ACT IN AERO: 2.0000 | INHALATION_SPRAY | Freq: Two times a day (BID) | RESPIRATORY_TRACT | 11 refills | Status: DC

## 2017-09-12 MED ORDER — GLYCOPYRROLATE-FORMOTEROL 9-4.8 MCG/ACT IN AERO: 2.0000 | INHALATION_SPRAY | Freq: Two times a day (BID) | RESPIRATORY_TRACT | 0 refills | Status: DC

## 2017-09-12 NOTE — Patient Instructions (Signed)
Plan A = Automatic = Bevespi Take 2 puffs first thing in am and then another 2 puffs about 12 hours later.   Work on inhaler technique:  relax and gently blow all the way out then take a nice smooth deep breath back in, triggering the inhaler at same time you start breathing in.  Hold for up to 5 seconds if you can. Blow out thru nose. Rinse and gargle with water when done     Plan B = Backup Only use your albuterol as a rescue medication to be used if you can't catch your breath by resting or doing a relaxed purse lip breathing pattern.  - The less you use it, the better it will work when you need it. - Ok to use the inhaler up to 2 puffs  every 4 hours if you must but call for appointment if use goes up over your usual need - Don't leave home without it !!  (think of it like the spare tire for your car)     Please schedule a follow up office visit in 6 weeks, call sooner if needed with pfts on return

## 2017-09-12 NOTE — Progress Notes (Signed)
Subjective:     Patient ID: [name], male   DOB: [birth year],    MRN: [id number]  HPI  30 yowm  HIV Pos/ current smoker changing over to vapes referred to pulmonary clinic 09/12/2017 by Julianne Handler NP with copd proved to have GOLD II criteria on initial eval      09/12/2017 1st [facility] office visit/ Max Romano   Chief Complaint  Patient presents with  . Pulmonary Consult    Referred by Julianne Handler, NP.  Pt states that he was dxed with COPD several years ago.  He states that he gets SOB when he walks a fast pace or when he carries something.    onset of sob x early 2000's indolent and progressive wo point where doe = MMRC2 = can't walk a nl pace on a flat grade s sob but does fine slow and flat  Cough is worst first thing in am mostly white mucus < 1 tsp  Sleep ok p clonepin/ xanax /nortrytilline at hs  No obvious day to day or daytime variability or assoc truly excess/ purulent sputum or mucus plugs or hemoptysis or cp or chest tightness, subjective wheeze or overt   hb symptoms. No unusual exposure hx or h/o childhood pna/ asthma or knowledge of premature birth.  Sleeping ok flat without nocturnal    exacerbation  of respiratory  c/o's or need for noct saba. Also denies any obvious fluctuation of symptoms with weather or environmental changes or other aggravating or alleviating factors except as outlined above   Current Allergies, Complete Past Medical History, Past Surgical History, Family History, and Social History were reviewed in [employer] record.  ROS  The following are not active complaints unless bolded Hoarseness, sore throat, dysphagia, dental problems, itching, sneezing,  nasal congestion or discharge of excess mucus or purulent secretions, ear ache,   fever, chills, sweats, unintended wt loss or wt gain, classically pleuritic or exertional cp,  orthopnea pnd or leg swelling, presyncope, palpitations, abdominal pain, anorexia, nausea, vomiting,  diarrhea  or change in bowel habits or change in bladder habits, change in stools or change in urine, dysuria, hematuria,  rash, arthralgias, visual complaints, headache, numbness, weakness or ataxia or problems with walking or coordination,  change in mood/affect or memory.        Current Meds  Medication Sig  . albuterol (PROVENTIL HFA;VENTOLIN HFA) 108 (90 Base) MCG/ACT inhaler Inhale 2 puffs every 6 (six) hours as needed into the lungs for wheezing or shortness of breath.  . ALPRAZolam (XANAX) 0.5 MG tablet Take 0.5 mg by mouth 3 (three) times daily as needed for anxiety.  . clonazePAM (KLONOPIN) 1 MG tablet Take 1 mg by mouth at bedtime.  . DESCOVY 200-25 MG tablet TAKE 1 TABLET BY MOUTH DAILY.  . furosemide (LASIX) 20 MG tablet Take 1 tablet (20 mg total) daily by mouth.  . metoprolol tartrate (LOPRESSOR) 25 MG tablet 25 mg in am and 12.5 pm (Patient taking differently: 1/4 tablet twice daily)  . Morphine Sulfate 40 MG CP24 Take 40 mg by mouth 2 (two) times daily.  . Multiple Vitamins-Minerals (MULTIVITAMIN WITH MINERALS) tablet Take 1 tablet by mouth daily.  . nortriptyline (PAMELOR) 75 MG capsule Take 150 mg by mouth at bedtime.  . promethazine (PHENERGAN) 25 MG tablet TAKE 1 TABLET BY MOUTH EVERY 8 HOURS AS NEEDED.  Marland Kitchen spironolactone (ALDACTONE) 25 MG tablet TAKE 1 TABLET BY MOUTH ONCE DAILY.  . SSD 1 % cream APPLY  TO AFFECTED AREA DAILY  . TIVICAY 50 MG tablet TAKE 1 TABLET BY MOUTH DAILY.         Review of Systems     Objective:   Physical Exam    Amb chronically ill appearing   marfoid wm nad  Wt Readings from Last 3 Encounters:  09/12/17 196 lb (88.9 kg)  06/09/17 185 lb (83.9 kg)  05/17/17 186 lb (84.4 kg)     Vital signs reviewed - Note on arrival 02 sats  92% on RA        Mid exp rhonchi cough  Chronic venous insuff    HEENT: nl   turbinates bilaterally, and oropharynx. Nl external ear canals without cough reflex- edentulous   NECK :  without  JVD/Nodes/TM/ nl carotid upstrokes bilaterally   LUNGS: no acc muscle use,  slt barrel contour with distant bs and mid exp rhonchi bilaterally with exp cough and mildly  hyper resonant to percussion  CV:  RRR  no s3 or murmur or increase in P2, and no edema   ABD:  soft and nontender with nl inspiratory excursion in the supine position. No bruits or organomegaly appreciated, bowel sounds nl  MS:  Nl gait/ ext warm without deformities, calf tenderness, cyanosis or clubbing No obvious joint restrictions   SKIN: warm and dry without lesions    NEURO:  alert, approp, nl sensorium with  no motor or cerebellar deficits apparent.      I personally reviewed images and agree with radiology impression as follows:  CXR:   05/12/18 Upper lobe bullous disease with fibrotic change throughout the lungs bilaterally. No frank edema or consolidation. Stable cardiac silhouette. No adenopathy demonstrable by radiography. Assessment:

## 2017-09-12 NOTE — Assessment & Plan Note (Addendum)
Spirometry 09/12/2017  FEV1 2.71 (64%)  Ratio 67 with mild/mod curvature   - 09/12/2017  After extensive coaching inhaler device  effectiveness =    75% from a baseline of 25% so try bevespi 2bid  Pt is borderline Group B in terms of symptom/risk and laba/lama therefore appropriate rx at this point and want to avoid ICS in HIV pt on HIV meds.  Formulary restrictions will be an ongoing challenge for the forseable future and I would be happy to pick an alternative if the pt will first  provide me a list of them but pt  will need to return here for training for any new device that is required eg dpi vs hfa vs respimat.    In meantime we can always provide samples so the patient never runs out of any needed respiratory medications.    Total time devoted to counseling  > 50 % of initial 60 min office visit:  review case with pt/ discussion of options/alternatives/ personally creating written customized instructions  in presence of pt  then going over those specific  Instructions directly with the pt including how to use all of the meds but in particular covering each new medication in detail and the difference between the maintenance= "automatic" meds and the prns using an action plan format for the latter (If this problem/symptom => do that organization reading Left to right).  Please see AVS from this visit for a full list of these instructions which I personally wrote for this pt and  are unique to this visit.    F/u in 6 weeks with full pfts

## 2017-09-12 NOTE — Assessment & Plan Note (Signed)

## 2017-09-13 ENCOUNTER — Other Ambulatory Visit (HOSPITAL_COMMUNITY): Payer: Self-pay | Admitting: Psychiatry

## 2017-09-22 ENCOUNTER — Other Ambulatory Visit: Payer: Self-pay

## 2017-09-22 DIAGNOSIS — G629 Polyneuropathy, unspecified: Secondary | ICD-10-CM

## 2017-09-22 MED ORDER — MORPHINE SULFATE ER 40 MG PO CP24: 40.0000 mg | ORAL_CAPSULE | Freq: Two times a day (BID) | ORAL | 0 refills | Status: DC

## 2017-10-18 ENCOUNTER — Other Ambulatory Visit: Payer: Self-pay | Admitting: Infectious Diseases

## 2017-10-18 DIAGNOSIS — I10 Essential (primary) hypertension: Secondary | ICD-10-CM

## 2017-10-23 ENCOUNTER — Telehealth: Payer: Self-pay | Admitting: *Deleted

## 2017-10-23 DIAGNOSIS — G629 Polyneuropathy, unspecified: Secondary | ICD-10-CM

## 2017-10-23 MED ORDER — MORPHINE SULFATE ER 40 MG PO CP24: 40.0000 mg | ORAL_CAPSULE | Freq: Two times a day (BID) | ORAL | 0 refills | Status: DC

## 2017-10-23 NOTE — Telephone Encounter (Signed)
Patient called for medication refill.  Marcos EkeGreg Calone, NP agreed to sign for Dr Ninetta LightsHatcher. Andree CossHowell, Ladonna Vanorder M, RN

## 2017-10-24 ENCOUNTER — Ambulatory Visit: Payer: Self-pay | Admitting: Internal Medicine

## 2017-10-24 MED ORDER — MORPHINE SULFATE ER 40 MG PO CP24: 40.0000 mg | ORAL_CAPSULE | Freq: Two times a day (BID) | ORAL | 0 refills | Status: DC

## 2017-10-24 NOTE — Telephone Encounter (Signed)
Received request for controlled substance refill in Dr. Moshe CiproHatcher's absence. Chart and [place] reviewed with no irregularities. Medication refilled and dated per previous prescription.

## 2017-10-24 NOTE — Addendum Note (Signed)
Addended by: Jeanine LuzALONE, Cassondra Stachowski D on: 10/24/2017 08:23 AM   Modules accepted: Orders

## 2017-11-15 ENCOUNTER — Other Ambulatory Visit: Payer: Self-pay | Admitting: Family Medicine

## 2017-11-15 DIAGNOSIS — I10 Essential (primary) hypertension: Secondary | ICD-10-CM

## 2017-11-20 ENCOUNTER — Other Ambulatory Visit: Payer: Self-pay | Admitting: Behavioral Health

## 2017-11-20 ENCOUNTER — Ambulatory Visit: Payer: Self-pay | Admitting: Internal Medicine

## 2017-11-20 DIAGNOSIS — G629 Polyneuropathy, unspecified: Secondary | ICD-10-CM

## 2017-11-20 MED ORDER — MORPHINE SULFATE ER 40 MG PO CP24: 40.0000 mg | ORAL_CAPSULE | Freq: Two times a day (BID) | ORAL | 0 refills | Status: DC

## 2017-11-23 ENCOUNTER — Other Ambulatory Visit: Payer: Self-pay | Admitting: Family

## 2017-11-23 DIAGNOSIS — G629 Polyneuropathy, unspecified: Secondary | ICD-10-CM

## 2017-11-23 MED ORDER — MORPHINE SULFATE ER 40 MG PO CP24: 40.0000 mg | ORAL_CAPSULE | Freq: Two times a day (BID) | ORAL | 0 refills | Status: DC

## 2017-11-23 NOTE — Progress Notes (Signed)
Nursing staff notified provider of patient needing refill of pain medication. His normal prescriber, Dr. Ninetta Lights, had left the office and would not return before the refill was due. I reviewed the [place] Controlled Substance Database and found no irregularities. Most recent urine drug screen completed on 2/21 with no non-prescribed substances. I will refill his medication for 1 month in Dr. Moshe Cipro absence.

## 2017-12-08 ENCOUNTER — Ambulatory Visit: Payer: Self-pay | Admitting: Internal Medicine

## 2017-12-08 ENCOUNTER — Ambulatory Visit: Payer: Self-pay | Admitting: Family Medicine

## 2017-12-11 ENCOUNTER — Encounter: Payer: Self-pay | Admitting: Family Medicine

## 2017-12-11 ENCOUNTER — Ambulatory Visit (INDEPENDENT_AMBULATORY_CARE_PROVIDER_SITE_OTHER): Payer: Medicare Other | Admitting: Family Medicine

## 2017-12-11 VITALS — BP 121/74 | HR 84 | Temp 97.9°F | Resp 18 | Ht 74.0 in | Wt 195.0 lb

## 2017-12-11 DIAGNOSIS — J449 Chronic obstructive pulmonary disease, unspecified: Secondary | ICD-10-CM

## 2017-12-11 DIAGNOSIS — N529 Male erectile dysfunction, unspecified: Secondary | ICD-10-CM

## 2017-12-11 DIAGNOSIS — E1142 Type 2 diabetes mellitus with diabetic polyneuropathy: Secondary | ICD-10-CM

## 2017-12-11 DIAGNOSIS — B20 Human immunodeficiency virus [HIV] disease: Secondary | ICD-10-CM

## 2017-12-11 DIAGNOSIS — Z1211 Encounter for screening for malignant neoplasm of colon: Secondary | ICD-10-CM

## 2017-12-11 DIAGNOSIS — S98131S Complete traumatic amputation of one right lesser toe, sequela: Secondary | ICD-10-CM

## 2017-12-11 DIAGNOSIS — Z21 Asymptomatic human immunodeficiency virus [HIV] infection status: Secondary | ICD-10-CM

## 2017-12-11 LAB — POCT URINALYSIS DIPSTICK
Bilirubin, UA: NEGATIVE
GLUCOSE UA: NEGATIVE
KETONES UA: NEGATIVE
Leukocytes, UA: NEGATIVE
Nitrite, UA: NEGATIVE
Protein, UA: NEGATIVE
RBC UA: NEGATIVE
Spec Grav, UA: 1.02 (ref 1.010–1.025)
UROBILINOGEN UA: 0.2 U/dL
pH, UA: 5.5 (ref 5.0–8.0)

## 2017-12-11 LAB — POCT GLYCOSYLATED HEMOGLOBIN (HGB A1C): Hemoglobin A1C: 5.1 % (ref 4.0–5.6)

## 2017-12-11 NOTE — Progress Notes (Signed)
Subjective:    Patient ID: [name], male    DOB: 28-Nov-1960, 57 y.o.   MRN: [id number]  HPI [name], a 57 year old male with a history of HIV, type 2 diabetes, COPD, depression and venous insufficiency presents for a follow up of chronic conditions.    He is followed by Dr. Johny Sax, infectious disease, for HIV.  Patient says that he has been taking all antiviral medications consistently.  He is not skipped any doses. Patient is complaining of chronic pain on today. Patient is not followed by pain management.   He has been taking MS Contin 40 mg extended release with maximum relief.   He states that he has been receiving medication from his infectious disease provider over the past several years.  Patient has a history of type 2 diabetes.  He is not on antidiabetic medications at this time. Patient controls diabetes primarily with diet and remaining active.  He currently denies polyuria, polydipsia, or polyphagia.    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  He states that he is in the process of receiving orthopedic shoes to assist with this problem.  Patient also has a history of depression and anxiety.  He is under the care of Dr. Dewayne Shorter, psychiatry.  Patient takes all prescribed medications consistently.  He denies suicidal or homicidal ideations.  Patient also denies visual or auditory hallucinations.  Patient has a history of COPD.  He is a chronic, every day tobacco user.  He typically smokes 1/2 pack/day.  He is attempted to quit in the past without success.  Past Medical History:  Diagnosis Date  . Cholesteatoma   . 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 COPD (chronic obstructive pulmonary disease) (HCC)   . Depression   . Folliculitis barbae 12/08/2014  . GERD (gastroesophageal reflux disease) 06/09/2015   . HIV (human immunodeficiency virus infection) (HCC)   . ILD (interstitial lung disease) (HCC) 12/08/2014  . Marfan syndrome    Dr Ninetta Lights follows  . Mitral valve prolapse   . Rhinosinusitis 12/08/2014  . Venous insufficiency (chronic) (peripheral)   . VT (ventricular tachycardia) (HCC)    Immunization History  Administered Date(s) Administered  . H1N1 05/02/2008  . Hepatitis A 02/20/2008, 12/10/2008  . Influenza Split 03/30/2012  . Influenza Whole 03/22/2006, 03/21/2007, 03/31/2008, 03/25/2009, 04/29/2010, 02/16/2011  . Influenza,inj,Quad PF,6+ Mos 03/12/2013, 03/12/2014, 03/24/2015, 04/13/2016, 04/18/2017  . Pneumococcal Polysaccharide-23 04/03/2005, 08/18/2010  . Tdap 07/16/2012, 01/09/2016    Review of Systems  Constitutional: Negative.   HENT: Negative.   Eyes: Negative for visual disturbance.  Respiratory: Positive for shortness of breath. Negative for apnea and chest tightness.   Endocrine: Positive for polyphagia.  Musculoskeletal: Positive for arthralgias.  Skin: Negative.   Neurological: Negative for tremors, weakness and headaches.  Hematological: Negative.   Psychiatric/Behavioral: Negative.        Objective:   Physical Exam  Cardiovascular: Normal rate and regular rhythm.  Murmur heard. Pulses:      Carotid pulses are 2+ on the right side, and 2+ on the left side.      Radial pulses are 2+ on the right side, and 2+ on the left side.       Femoral pulses are 2+ on the right side, and 2+ on the left side.      Popliteal pulses  are 2+ on the right side, and 2+ on the left side.       Dorsalis pedis pulses are 2+ on the right side, and 2+ on the left side.       Posterior tibial pulses are 2+ on the right side, and 2+ on the left side.  There were no vitals taken for this visit.  General Appearance:    Alert, cooperative, mild distress, appears stated age  Head:    Normocephalic, without obvious abnormality, atraumatic  Eyes:    PERRL, conjunctiva/corneas  clear, EOM's intact, fundi    benign, both eyes       Ears:    Normal TM's and external ear canals, both ears  Nose:   Nares normal, septum midline, mucosa normal, no drainage   or sinus tenderness  Throat:   Lips, mucosa, and tongue normal; teeth and gums normal  Neck:   Supple, symmetrical, trachea midline, no adenopathy;       thyroid:  No enlargement/tenderness/nodules; no carotid   bruit or JVD  Back:     Symmetric, no curvature, ROM normal, no CVA tenderness  Lungs:     Clear to auscultation bilaterally, respirations unlabored  Chest wall:    No tenderness or deformity  Heart:    Regular rate and rhythm, S1 and S2 normal, Murmur present, rub or gallop  Abdomen:     Soft, non-tender, bowel sounds active all four quadrants,    no masses, no organomegaly  Extremities:   Extremities normal, atraumatic, no cyanosis or edema  Pulses:   2+ and symmetric all extremities  Skin:   Skin color, texture, turgor normal, no rashes or lesions  Lymph nodes:   Cervical, supraclavicular, and axillary nodes normal  Neurologic:   CNII-XII intact. Normal strength, sensation and reflexes      throughout    Physical Exam  Cardiovascular: Normal rate and regular rhythm.  Murmur heard. Pulses:      Carotid pulses are 2+ on the right side, and 2+ on the left side.      Radial pulses are 2+ on the right side, and 2+ on the left side.       Femoral pulses are 2+ on the right side, and 2+ on the left side.      Popliteal pulses are 2+ on the right side, and 2+ on the left side.       Dorsalis pedis pulses are 2+ on the right side, and 2+ on the left side.       Posterior tibial pulses are 2+ on the right side, and 2+ on the left side.       BP 121/74 (BP Location: Right Arm, Patient Position: Sitting, Cuff Size: Normal)   Pulse 84   Temp 97.9 F (36.6 C) (Oral)   Resp 18   Ht 6\' 2"  (1.88 m)   Wt 195 lb (88.5 kg)   SpO2 95%   BMI 25.04 kg/m  Assessment & Plan:  1. Type 2 diabetes, controlled,  with peripheral neuropathy (HCC) Hemoglobin a1C is 5.1, which is at goal. No medications warranted at this time.  - HgB A1c - Urinalysis Dipstick  2. COPD GOLD II/ still smoking  Follow up with pulmonology as scheduled.   3. Amputation, traumatic, toe, right, sequela [facility](HCC) Patient is request replacement for orthopedic shoes.  - Ambulatory referral for Orthotics  4. HIV infection, unspecified symptom status (HCC) Follow up with infectious disease as scheduled  5. Erectile dysfunction, unspecified erectile  dysfunction type - Testosterone - PSA - Urinalysis Dipstick  6. Screening for colon cancer - Ambulatory referral to Gastroenterology   RTC: 6 months for chronic conditions   Lachina Rennis Petty  MSN, FNP-C Patient Care [facility] [address] [place], [place] [address] [phone]

## 2017-12-11 NOTE — Patient Instructions (Signed)
We will follow-up by phone with any abnormal laboratory results.   For orthotics, I have sent a referral they will contact you by phone.    Your blood pressure is at goal on current medication regimen.  No medication changes warranted on today.  Will review renal functioning and potassium level as results become available.- Continue medication, monitor blood pressure at home. Continue DASH diet. Reminder to go to the ER if any CP, SOB, nausea, dizziness, severe HA, changes vision/speech, left arm numbness and tingling and jaw pain.   Follow-up with infectious disease as scheduled.

## 2017-12-12 LAB — PSA: PROSTATE SPECIFIC AG, SERUM: 0.4 ng/mL (ref 0.0–4.0)

## 2017-12-12 LAB — TESTOSTERONE: TESTOSTERONE: 96 ng/dL — AB (ref 264–916)

## 2017-12-13 ENCOUNTER — Other Ambulatory Visit: Payer: Self-pay | Admitting: Family Medicine

## 2017-12-13 ENCOUNTER — Telehealth: Payer: Self-pay

## 2017-12-13 DIAGNOSIS — E349 Endocrine disorder, unspecified: Secondary | ICD-10-CM

## 2017-12-13 MED ORDER — TESTOSTERONE 50 MG/5GM (1%) TD GEL
5.0000 g | Freq: Every day | TRANSDERMAL | 5 refills | Status: DC
Start: 2017-12-13 — End: 2019-06-20

## 2017-12-13 NOTE — Telephone Encounter (Signed)
Called and spoke with patient, advised that testosterone is decreased and that we are starting him on andro gell which has been sent into pharmacy. Advised that he should take as directed and we will recheck levels at next appointment. Thanks!

## 2017-12-13 NOTE — Telephone Encounter (Signed)
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 Sent andro gel to pharmacy. Will recheck testosterone level at scheduled follow up.    Nolon NationsLachina Moore Hollis  MSN, FNP-C Patient Care [facility] [address]  [place], [place] [address] [phone]

## 2017-12-13 NOTE — Progress Notes (Signed)
Meds ordered this encounter  Medications  . testosterone (ANDROGEL) 50 MG/5GM (1%) GEL    Sig: Place 5 g onto the skin daily.    Dispense:  1 Tube    Refill:  5     Nolon NationsLachina Moore Jaysa Kise  MSN, FNP-C Patient [facility] [address]  [place], [place] [address] [phone]

## 2017-12-13 NOTE — Telephone Encounter (Signed)
Called, no answer. Left a message for patient to call back and left call back number. Thanks!  

## 2017-12-14 ENCOUNTER — Other Ambulatory Visit: Payer: Self-pay | Admitting: Family Medicine

## 2017-12-14 DIAGNOSIS — L309 Dermatitis, unspecified: Secondary | ICD-10-CM

## 2017-12-14 MED ORDER — DESONIDE 0.05 % EX CREA: TOPICAL_CREAM | CUTANEOUS | 1 refills | Status: DC

## 2017-12-14 NOTE — Progress Notes (Signed)
Meds ordered this encounter  Medications  . desonide (DESOWEN) 0.05 % cream    Sig: Apply to affected area 2 times daily    Dispense:  60 g    Refill:  1     Nolon NationsLachina Moore Janazia Schreier  MSN, FNP-C Patient [facility] [address]  [place], [place] [address] [phone]

## 2017-12-18 ENCOUNTER — Telehealth: Payer: Self-pay

## 2017-12-18 ENCOUNTER — Other Ambulatory Visit: Payer: Self-pay

## 2017-12-18 NOTE — Telephone Encounter (Signed)
Called, no answer. Left a message for patient to call back. Thanks!  

## 2017-12-21 ENCOUNTER — Other Ambulatory Visit: Payer: Self-pay | Admitting: *Deleted

## 2017-12-21 DIAGNOSIS — G629 Polyneuropathy, unspecified: Secondary | ICD-10-CM

## 2017-12-21 MED ORDER — MORPHINE SULFATE ER 40 MG PO CP24: 40.0000 mg | ORAL_CAPSULE | Freq: Two times a day (BID) | ORAL | 0 refills | Status: DC

## 2017-12-22 ENCOUNTER — Other Ambulatory Visit: Payer: Self-pay

## 2017-12-22 ENCOUNTER — Emergency Department (HOSPITAL_COMMUNITY)
Admission: EM | Admit: 2017-12-22 | Discharge: 2017-12-22 | Disposition: A | Discharge status: Home / Self Care | Payer: Medicare Other | Attending: Emergency Medicine | Admitting: Emergency Medicine

## 2017-12-22 ENCOUNTER — Emergency Department (HOSPITAL_COMMUNITY): Payer: Medicare Other

## 2017-12-22 DIAGNOSIS — W228XXA Striking against or struck by other objects, initial encounter: Secondary | ICD-10-CM | POA: Insufficient documentation

## 2017-12-22 DIAGNOSIS — Z21 Asymptomatic human immunodeficiency virus [HIV] infection status: Secondary | ICD-10-CM | POA: Diagnosis not present

## 2017-12-22 DIAGNOSIS — F329 Major depressive disorder, single episode, unspecified: Secondary | ICD-10-CM | POA: Insufficient documentation

## 2017-12-22 DIAGNOSIS — R0902 Hypoxemia: Secondary | ICD-10-CM | POA: Diagnosis not present

## 2017-12-22 DIAGNOSIS — Y929 Unspecified place or not applicable: Secondary | ICD-10-CM | POA: Diagnosis not present

## 2017-12-22 DIAGNOSIS — Z79899 Other long term (current) drug therapy: Secondary | ICD-10-CM | POA: Diagnosis not present

## 2017-12-22 DIAGNOSIS — Y9389 Activity, other specified: Secondary | ICD-10-CM | POA: Insufficient documentation

## 2017-12-22 DIAGNOSIS — S8992XA Unspecified injury of left lower leg, initial encounter: Secondary | ICD-10-CM | POA: Diagnosis not present

## 2017-12-22 DIAGNOSIS — J449 Chronic obstructive pulmonary disease, unspecified: Secondary | ICD-10-CM | POA: Diagnosis not present

## 2017-12-22 DIAGNOSIS — Y998 Other external cause status: Secondary | ICD-10-CM | POA: Insufficient documentation

## 2017-12-22 DIAGNOSIS — S81812A Laceration without foreign body, left lower leg, initial encounter: Secondary | ICD-10-CM

## 2017-12-22 DIAGNOSIS — R58 Hemorrhage, not elsewhere classified: Secondary | ICD-10-CM | POA: Diagnosis not present

## 2017-12-22 DIAGNOSIS — E119 Type 2 diabetes mellitus without complications: Secondary | ICD-10-CM | POA: Insufficient documentation

## 2017-12-22 DIAGNOSIS — Z23 Encounter for immunization: Secondary | ICD-10-CM | POA: Diagnosis not present

## 2017-12-22 DIAGNOSIS — F17210 Nicotine dependence, cigarettes, uncomplicated: Secondary | ICD-10-CM | POA: Insufficient documentation

## 2017-12-22 MED ORDER — TETANUS-DIPHTH-ACELL PERTUSSIS 5-2.5-18.5 LF-MCG/0.5 IM SUSP
0.5000 mL | Freq: Once | INTRAMUSCULAR | Status: AC
  Administered 2017-12-22: 0.5 mL via INTRAMUSCULAR
  Filled 2017-12-22: qty 0.5

## 2017-12-22 MED ORDER — LIDOCAINE HCL (PF) 1 % IJ SOLN
30.0000 mL | Freq: Once | INTRAMUSCULAR | Status: AC
  Administered 2017-12-22: 30 mL
  Filled 2017-12-22: qty 30

## 2017-12-22 MED ORDER — BACITRACIN ZINC 500 UNIT/GM EX OINT
TOPICAL_OINTMENT | Freq: Two times a day (BID) | CUTANEOUS | Status: DC
  Administered 2017-12-22: 18:00:00 via TOPICAL
  Filled 2017-12-22: qty 0.9

## 2017-12-22 MED ORDER — CEPHALEXIN 500 MG PO CAPS: 500.0000 mg | ORAL_CAPSULE | Freq: Four times a day (QID) | ORAL | 0 refills | Status: DC

## 2017-12-22 NOTE — Discharge Instructions (Addendum)
Treatment: Keep your wound dry and dressing applied until this time tomorrow. After 24 hours, you may wash with warm soapy water. Dry and apply antibiotic ointment and clean dressing. Do this daily until your sutures are removed. ° °Follow-up: Please follow-up with your primary care provider or return to emergency department in 10-14 days for suture removal. Be aware of signs of infection: fever, increasing pain, redness, swelling, drainage from the area. Please call your primary care provider or return to emergency department if you develop any of these symptoms or if any of the sutures come out prior to removal. Please return to the emergency department if you develop any other new or worsening symptoms. ° °
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
## 2017-12-29 ENCOUNTER — Ambulatory Visit (HOSPITAL_COMMUNITY): Payer: Medicare Other | Attending: Cardiovascular Disease

## 2017-12-29 ENCOUNTER — Other Ambulatory Visit: Payer: Self-pay

## 2017-12-29 DIAGNOSIS — Q8740 Marfan's syndrome, unspecified: Secondary | ICD-10-CM | POA: Diagnosis present

## 2017-12-29 DIAGNOSIS — J449 Chronic obstructive pulmonary disease, unspecified: Secondary | ICD-10-CM | POA: Diagnosis not present

## 2017-12-29 DIAGNOSIS — I517 Cardiomegaly: Secondary | ICD-10-CM

## 2017-12-29 DIAGNOSIS — E119 Type 2 diabetes mellitus without complications: Secondary | ICD-10-CM | POA: Insufficient documentation

## 2017-12-29 DIAGNOSIS — I348 Other nonrheumatic mitral valve disorders: Secondary | ICD-10-CM

## 2017-12-29 DIAGNOSIS — Z720 Tobacco use: Secondary | ICD-10-CM | POA: Insufficient documentation

## 2017-12-29 DIAGNOSIS — B20 Human immunodeficiency virus [HIV] disease: Secondary | ICD-10-CM | POA: Diagnosis not present

## 2017-12-29 DIAGNOSIS — I472 Ventricular tachycardia: Secondary | ICD-10-CM | POA: Diagnosis not present

## 2018-01-08 ENCOUNTER — Ambulatory Visit: Payer: Medicare Other | Admitting: Internal Medicine

## 2018-01-09 ENCOUNTER — Ambulatory Visit: Payer: Medicare Other | Admitting: Podiatry

## 2018-01-11 ENCOUNTER — Other Ambulatory Visit: Payer: Self-pay | Admitting: Internal Medicine

## 2018-01-11 DIAGNOSIS — J449 Chronic obstructive pulmonary disease, unspecified: Secondary | ICD-10-CM

## 2018-01-12 ENCOUNTER — Ambulatory Visit: Payer: Self-pay | Admitting: Internal Medicine

## 2018-01-19 ENCOUNTER — Other Ambulatory Visit: Payer: Self-pay | Admitting: *Deleted

## 2018-01-19 DIAGNOSIS — G629 Polyneuropathy, unspecified: Secondary | ICD-10-CM

## 2018-01-19 MED ORDER — MORPHINE SULFATE ER 40 MG PO CP24: 40.0000 mg | ORAL_CAPSULE | Freq: Two times a day (BID) | ORAL | 0 refills | Status: DC

## 2018-01-23 ENCOUNTER — Telehealth: Payer: Self-pay

## 2018-01-23 NOTE — Telephone Encounter (Signed)
Called, no answer. Left a message for patient to call back. Thanks!  

## 2018-01-24 ENCOUNTER — Telehealth: Payer: Self-pay

## 2018-01-24 NOTE — Telephone Encounter (Signed)
Called, no answer. Left a voicemail for patient to call back. Thanks!  

## 2018-01-31 ENCOUNTER — Other Ambulatory Visit (HOSPITAL_COMMUNITY)
Admission: RE | Admit: 2018-01-31 | Discharge: 2018-01-31 | Disposition: A | Discharge status: Home / Self Care | Payer: Medicare Other | Source: Ambulatory Visit | Attending: Infectious Diseases | Admitting: Infectious Diseases

## 2018-01-31 ENCOUNTER — Other Ambulatory Visit: Payer: Medicare Other

## 2018-01-31 DIAGNOSIS — Z113 Encounter for screening for infections with a predominantly sexual mode of transmission: Secondary | ICD-10-CM

## 2018-01-31 DIAGNOSIS — Z79899 Other long term (current) drug therapy: Secondary | ICD-10-CM

## 2018-01-31 DIAGNOSIS — B20 Human immunodeficiency virus [HIV] disease: Secondary | ICD-10-CM

## 2018-02-01 LAB — COMPREHENSIVE METABOLIC PANEL
AG Ratio: 1.4 (calc) (ref 1.0–2.5)
ALT: 7 U/L — AB (ref 9–46)
AST: 9 U/L — AB (ref 10–35)
Albumin: 4.1 g/dL (ref 3.6–5.1)
Alkaline phosphatase (APISO): 113 U/L (ref 40–115)
BUN: 11 mg/dL (ref 7–25)
CHLORIDE: 101 mmol/L (ref 98–110)
CO2: 29 mmol/L (ref 20–32)
CREATININE: 1.16 mg/dL (ref 0.70–1.33)
Calcium: 9.6 mg/dL (ref 8.6–10.3)
GLOBULIN: 3 g/dL (ref 1.9–3.7)
Glucose, Bld: 95 mg/dL (ref 65–99)
Potassium: 4.8 mmol/L (ref 3.5–5.3)
Sodium: 139 mmol/L (ref 135–146)
TOTAL PROTEIN: 7.1 g/dL (ref 6.1–8.1)
Total Bilirubin: 0.4 mg/dL (ref 0.2–1.2)

## 2018-02-01 LAB — CBC
HEMATOCRIT: 42.6 % (ref 38.5–50.0)
Hemoglobin: 14 g/dL (ref 13.2–17.1)
MCH: 28.3 pg (ref 27.0–33.0)
MCHC: 32.9 g/dL (ref 32.0–36.0)
MCV: 86.1 fL (ref 80.0–100.0)
MPV: 9.4 fL (ref 7.5–12.5)
Platelets: 227 10*3/uL (ref 140–400)
RBC: 4.95 10*6/uL (ref 4.20–5.80)
RDW: 13.4 % (ref 11.0–15.0)
WBC: 5.8 10*3/uL (ref 3.8–10.8)

## 2018-02-01 LAB — RPR: RPR Ser Ql: NONREACTIVE

## 2018-02-01 LAB — LIPID PANEL
Cholesterol: 166 mg/dL (ref <200)
HDL: 38 mg/dL — ABNORMAL LOW (ref >40)
LDL Cholesterol (Calc): 108 mg/dL (calc) — ABNORMAL HIGH
Non-HDL Cholesterol (Calc): 128 mg/dL (calc) (ref <130)
TRIGLYCERIDES: 105 mg/dL (ref <150)
Total CHOL/HDL Ratio: 4.4 (calc) (ref <5.0)

## 2018-02-01 LAB — T-HELPER CELL (CD4) - (RCID CLINIC ONLY)
CD4 % Helper T Cell: 12 % — ABNORMAL LOW (ref 33–55)
CD4 T Cell Abs: 140 /uL — ABNORMAL LOW (ref 400–2700)

## 2018-02-02 LAB — URINE CYTOLOGY ANCILLARY ONLY
CHLAMYDIA, DNA PROBE: NEGATIVE
NEISSERIA GONORRHEA: NEGATIVE

## 2018-02-02 LAB — HIV-1 RNA QUANT-NO REFLEX-BLD
HIV 1 RNA Quant: <20 copies/mL
HIV-1 RNA QUANT, LOG: NOT DETECTED {Log_copies}/mL

## 2018-02-05 ENCOUNTER — Other Ambulatory Visit: Payer: Self-pay | Admitting: Family Medicine

## 2018-02-05 ENCOUNTER — Other Ambulatory Visit: Payer: Self-pay | Admitting: Infectious Diseases

## 2018-02-05 DIAGNOSIS — B20 Human immunodeficiency virus [HIV] disease: Secondary | ICD-10-CM

## 2018-02-05 DIAGNOSIS — R000 Tachycardia, unspecified: Secondary | ICD-10-CM

## 2018-02-05 DIAGNOSIS — I952 Hypotension due to drugs: Secondary | ICD-10-CM

## 2018-02-07 ENCOUNTER — Ambulatory Visit: Payer: Medicare Other | Admitting: Podiatry

## 2018-02-07 ENCOUNTER — Ambulatory Visit: Payer: Medicare Other | Admitting: Orthotics

## 2018-02-12 ENCOUNTER — Encounter: Payer: Self-pay | Admitting: Family Medicine

## 2018-02-14 ENCOUNTER — Encounter: Payer: Self-pay | Admitting: Internal Medicine

## 2018-02-14 ENCOUNTER — Ambulatory Visit (INDEPENDENT_AMBULATORY_CARE_PROVIDER_SITE_OTHER): Payer: Medicare Other | Admitting: Internal Medicine

## 2018-02-14 ENCOUNTER — Telehealth: Payer: Self-pay

## 2018-02-14 ENCOUNTER — Ambulatory Visit: Payer: Self-pay | Admitting: Infectious Diseases

## 2018-02-14 VITALS — BP 112/72 | HR 80 | Ht 70.86 in | Wt 190.0 lb

## 2018-02-14 DIAGNOSIS — F17210 Nicotine dependence, cigarettes, uncomplicated: Secondary | ICD-10-CM

## 2018-02-14 DIAGNOSIS — J449 Chronic obstructive pulmonary disease, unspecified: Secondary | ICD-10-CM | POA: Diagnosis not present

## 2018-02-14 LAB — PULMONARY FUNCTION TEST
DL/VA % pred: 58 %
DL/VA: 2.73 ml/min/mmHg/L
DLCO COR % PRED: 58 %
DLCO UNC: 19.29 ml/min/mmHg
DLCO cor: 19.63 ml/min/mmHg
DLCO unc % pred: 57 %
FEF 25-75 Post: 2.26 L/sec
FEF 25-75 Pre: 2.06 L/sec
FEF2575-%Change-Post: 9 %
FEF2575-%PRED-POST: 71 %
FEF2575-%Pred-Pre: 65 %
FEV1-%CHANGE-POST: 2 %
FEV1-%PRED-POST: 94 %
FEV1-%Pred-Pre: 91 %
FEV1-Post: 3.56 L
FEV1-Pre: 3.47 L
FEV1FVC-%Change-Post: 3 %
FEV1FVC-%PRED-PRE: 91 %
FEV6-%Change-Post: 1 %
FEV6-%Pred-Post: 103 %
FEV6-%Pred-Pre: 101 %
FEV6-Post: 4.94 L
FEV6-Pre: 4.85 L
FEV6FVC-%Change-Post: 2 %
FEV6FVC-%Pred-Post: 103 %
FEV6FVC-%Pred-Pre: 101 %
FVC-%CHANGE-POST: 0 %
FVC-%PRED-PRE: 100 %
FVC-%Pred-Post: 100 %
FVC-POST: 4.97 L
FVC-PRE: 4.99 L
POST FEV1/FVC RATIO: 72 %
PRE FEV6/FVC RATIO: 97 %
Post FEV6/FVC ratio: 99 %
Pre FEV1/FVC ratio: 70 %
RV % pred: 146 %
RV: 3.29 L
TLC % pred: 113 %
TLC: 8.16 L

## 2018-02-14 MED ORDER — GLYCOPYRROLATE-FORMOTEROL 9-4.8 MCG/ACT IN AERO: 2.0000 | INHALATION_SPRAY | Freq: Two times a day (BID) | RESPIRATORY_TRACT | 0 refills | Status: DC

## 2018-02-14 NOTE — Telephone Encounter (Signed)
Patient called today to pick up script for Morphine 40 mg #60 on 8/16. Patient picked last script from our office on 7/22. Stated he would be running out of medication on 8/18. Spoke with Clinical Manager regarding early pick up for script, was asked to call patient for additional information regarding early pick up time.  Left a voice mail for patient to call office back.  Need additional information on why patient must pick up early script. If patient is taking more than two tabs needs to know why he is doing so.  [name], [place]

## 2018-02-14 NOTE — Patient Instructions (Addendum)
Plan A = Automatic = Bevespi Take 2 puffs first thing in am and then another 2 puffs about 12 hours later.   Work on inhaler technique:  relax and gently blow all the way out then take a nice smooth deep breath back in, triggering the inhaler at same time you start breathing in.  Hold for up to 5 seconds if you can. Blow out thru nose. Rinse and gargle with water when done     Plan B = Backup Only use your albuterol as a rescue medication to be used if you can't catch your breath by resting or doing a relaxed purse lip breathing pattern.  - The less you use it, the better it will work when you need it. - Ok to use the inhaler up to 2 puffs  every 4 hours if you must but call for appointment if use goes up over your usual need - Don't leave home without it !!  (think of it like the spare tire for your car)     If you are satisfied with your treatment plan,  let your doctor know and he/she can either refill your medications or you can return here when your prescription runs out.     If in any way you are not 100% satisfied,  please tell us.  If 100% better, tell your friends!  Pulmonary follow up is as needed

## 2018-02-14 NOTE — Progress Notes (Signed)
PFT completed today. 02/14/18  

## 2018-02-14 NOTE — Progress Notes (Signed)
Subjective:     Patient ID: [name], male   DOB: 02-Dec-1960,    MRN: [id number]    Brief patient profile:  77 yowm  HIV Pos/ current smoker changing over to vapes referred to pulmonary clinic 09/12/2017 by Cammie Sickle NP with copd proved to have GOLD II criteria on initial eval  But nl pfts 02/14/2018 on bevespi      History of Present Illness  09/12/2017 1st [facility] office visit/ [facility]   Chief Complaint  Patient presents with  . Pulmonary Consult    Referred by Cammie Sickle, NP.  Pt states that he was dxed with COPD several years ago.  He states that he gets SOB when he walks a fast pace or when he carries something.    onset of sob x early 2000's indolent and progressive wo point where doe = MMRC2 = can't walk a nl pace on a flat grade s sob but does fine slow and flat  Cough is worst first thing in am mostly white mucus < 1 tsp  Sleep ok p clonepin/ xanax /nortrytilline at hs rec Plan A = Automatic = Bevespi Take 2 puffs first thing in am and then another 2 puffs about 12 hours later.  Work on inhaler technique: Plan B = Backup Only use your albuterol as a rescue medication  Please schedule a follow up office visit in 6 weeks, call sooner if needed with pfts on return    02/14/2018  f/u ov/[facility] re: copd 0/ still smoking  Chief Complaint  Patient presents with  . Follow-up    PFT today, SOB with activity, productive cough, heartburn, sneezing, difficutly swallowing  Dyspnea:  MMRC1 = can walk nl pace, flat grade, can't hurry or go uphills or steps s sob   Cough: some in am / beige  Sleeping: propped up < 30 degrees SABA use: not using rescue 02: none    No obvious day to day or daytime variability or assoc excess/ purulent sputum or mucus plugs or hemoptysis or cp or chest tightness, subjective wheeze or overt sinus or hb symptoms.   Sleeping as above  without nocturnal  or early am exacerbation  of respiratory  c/o's or need for noct saba. Also denies any  obvious fluctuation of symptoms with weather or environmental changes or other aggravating or alleviating factors except as outlined above   No unusual exposure hx or h/o childhood pna/ asthma or knowledge of premature birth.  Current Allergies, Complete Past Medical History, Past Surgical History, Family History, and Social History were reviewed in [employer] record.  ROS  The following are not active complaints unless bolded Hoarseness, sore throat, dysphagia, dental problems, itching, sneezing,  nasal congestion or discharge of excess mucus or purulent secretions, ear ache,   fever, chills, sweats, unintended wt loss or wt gain, classically pleuritic or exertional cp,  orthopnea pnd or arm/hand swelling  or leg swelling, presyncope, palpitations, abdominal pain, anorexia, nausea, vomiting, diarrhea  or change in bowel habits or change in bladder habits, change in stools or change in urine, dysuria, hematuria,  rash, arthralgias, visual complaints, headache, numbness, weakness or ataxia or problems with walking or coordination,  change in mood or  memory.        Current Meds  Medication Sig  . albuterol (PROVENTIL HFA;VENTOLIN HFA) 108 (90 Base) MCG/ACT inhaler Inhale 2 puffs every 6 (six) hours as needed into the lungs for wheezing or shortness of breath.  . ALPRAZolam (  XANAX) 0.5 MG tablet Take 0.5 mg by mouth 3 (three) times daily as needed for anxiety.  . clonazePAM (KLONOPIN) 1 MG tablet Take 1 mg by mouth at bedtime.  . DESCOVY 200-25 MG tablet TAKE 1 TABLET BY MOUTH DAILY.  Marland Kitchen desonide (DESOWEN) 0.05 % cream Apply to affected area 2 times daily (Patient taking differently: Apply 1 application topically 2 (two) times daily as needed (rash). )  . furosemide (LASIX) 20 MG tablet TAKE 1 TABLET BY MOUTH DAILY  . Glycopyrrolate-Formoterol (BEVESPI AEROSPHERE) 9-4.8 MCG/ACT AERO Inhale 2 puffs into the lungs 2 (two) times daily.  . metoprolol tartrate (LOPRESSOR) 25 MG  tablet TAKE 1 TABLET BY MOUTH IN THE MORNING AND 1/2 TABLET IN THE EVENING (Patient taking differently: 12.5 mg 2 (two) times daily. TAKE 1 TABLET BY MOUTH IN THE MORNING AND 1/2 TABLET IN THE EVENING)  . Morphine Sulfate 40 MG CP24 Take 40 mg by mouth 2 (two) times daily.  . Multiple Vitamins-Minerals (MULTIVITAMIN WITH MINERALS) tablet Take 1 tablet by mouth daily.  Marland Kitchen NARCAN 4 MG/0.1ML LIQD nasal spray kit   . nortriptyline (PAMELOR) 75 MG capsule Take 150 mg by mouth at bedtime.  . Omega-3 Fatty Acids (FISH OIL) 1000 MG CAPS Take 1 capsule by mouth at bedtime.  . promethazine (PHENERGAN) 25 MG tablet TAKE 1 TABLET BY MOUTH EVERY 8 HOURS AS NEEDED.  Marland Kitchen spironolactone (ALDACTONE) 25 MG tablet TAKE 1 TABLET BY MOUTH ONCE DAILY.  Marland Kitchen testosterone (ANDROGEL) 50 MG/5GM (1%) GEL Place 5 g onto the skin daily.  Marland Kitchen TIVICAY 50 MG tablet TAKE 1 TABLET BY MOUTH DAILY.  Marland Kitchen   Glycopyrrolate-Formoterol (BEVESPI AEROSPHERE) 9-4.8 MCG/ACT AERO Inhale 2 puffs into the lungs 2 (two) times daily.                   Objective:   Physical Exam    Amb chronically ill appearing   marfoid wm nad  Wt Readings from Last 3 Encounters:  09/12/17 196 lb (88.9 kg)  06/09/17 185 lb (83.9 kg)  05/17/17 186 lb (84.4 kg)     Vital signs reviewed - Note on arrival 02 sats  92% on RA     HEENT: nl dentition, turbinates bilaterally, and oropharynx. Nl external ear canals without cough reflex   NECK :  without JVD/Nodes/TM/ nl carotid upstrokes bilaterally   LUNGS: no acc muscle use,  Nl contour chest with min exp rhonchi  bilaterally without cough on insp or exp maneuvers   CV:  RRR  no s3 or murmur or increase in P2, and no edema   ABD:  soft and nontender with nl inspiratory excursion in the supine position. No bruits or organomegaly appreciated, bowel sounds nl  MS:  Nl gait/ ext warm without deformities, calf tenderness, cyanosis or clubbing No obvious joint restrictions   SKIN: warm and dry with venous  stasis changes both LE's    NEURO:  alert, approp, nl sensorium with  no motor or cerebellar deficits apparent.         I personally reviewed images and agree with radiology impression as follows:  CXR:   05/12/18 Upper lobe bullous disease with fibrotic change throughout the lungs bilaterally. No frank edema or consolidation. Stable cardiac silhouette. No adenopathy demonstrable by radiography.

## 2018-02-15 ENCOUNTER — Telehealth: Payer: Self-pay

## 2018-02-15 ENCOUNTER — Encounter: Payer: Self-pay | Admitting: Internal Medicine

## 2018-02-15 ENCOUNTER — Other Ambulatory Visit: Payer: Self-pay

## 2018-02-15 DIAGNOSIS — L309 Dermatitis, unspecified: Secondary | ICD-10-CM

## 2018-02-15 MED ORDER — DESONIDE 0.05 % EX CREA: TOPICAL_CREAM | CUTANEOUS | 2 refills | Status: AC

## 2018-02-15 NOTE — Assessment & Plan Note (Addendum)
Spirometry 09/12/2017  FEV1 2.71 (64%)  Ratio 67 with mild/mod curvature   - 09/12/2017   try bevespi 2bid - PFT's  02/14/2018  FEV1 3.56 (94 % ) ratio 72  p 2 % improvement from saba p bevespi prior to study with DLCO  57/58c % corrects to 58 % for alv volume   - 02/14/2018  After extensive coaching inhaler device,  effectiveness =   75% from a baseline of 50%   Despite active smoking he has normalized his fev1 and fev1/fvc ratio so technically now a GOLD 0 and one could argue more of an AB phenotype so should be on laba/ics but given HIV status will avoid ics for now and consider change to low dose ics/laba (ie symbicort 80 2bid) should he start having more cough or tendency to variability/ acute exac  I had an extended discussion with the patient reviewing all relevant studies completed to date and  lasting 15 to 20 minutes of a 25 minute visit    See device teaching which extended face to face time for this visit.  Each maintenance medication was reviewed in detail including emphasizing most importantly the difference between maintenance and prns and under what circumstances the prns are to be triggered using an action plan format that is not reflected in the computer generated alphabetically organized AVS which I have not found useful in most complex patients, especially with respiratory illnesses  Please see AVS for specific instructions unique to this visit that I personally wrote and verbalized to the the pt in detail and then reviewed with pt  by my nurse highlighting any  changes in therapy recommended at today's visit to their plan of care.      Pulmonary f/u is prn

## 2018-02-15 NOTE — Assessment & Plan Note (Signed)
4-5 min discussion re active cigarette smoking in addition to office E&M  Ask about tobacco use:   ongoing Advise quitting  I emphasized that although we never turn away smokers from the pulmonary clinic, we do ask that they understand that the recommendations that we make  won't work nearly as well in the presence of continued cigarette exposure.   Assess willingness:  Not committed at this point Assist in quit attempt:  Per PCP when ready Arrange follow up:   Follow up per Primary Care planned        

## 2018-02-15 NOTE — Telephone Encounter (Signed)
Patient needed refill for cream. This has been filled and sent into pharmacy. Thanks!

## 2018-02-16 NOTE — Telephone Encounter (Signed)
Patient is requesting an early refill of his Morphine and is very emotional and upset because it is early and his provider is not here to ask at this moment. Advised will ask and give him a call back.

## 2018-02-16 NOTE — Telephone Encounter (Signed)
Ok with me. thanks 

## 2018-02-19 ENCOUNTER — Telehealth: Payer: Self-pay

## 2018-02-19 DIAGNOSIS — G629 Polyneuropathy, unspecified: Secondary | ICD-10-CM

## 2018-02-19 MED ORDER — MORPHINE SULFATE ER 40 MG PO CP24: 40.0000 mg | ORAL_CAPSULE | Freq: Two times a day (BID) | ORAL | 0 refills | Status: DC

## 2018-02-19 NOTE — Telephone Encounter (Signed)
Left message on patient's voice mail.  Script ready for pick up.   Laurell Josephsammy K Tyliah Schlereth, RN

## 2018-02-22 ENCOUNTER — Encounter: Payer: Self-pay | Admitting: Infectious Diseases

## 2018-02-22 ENCOUNTER — Ambulatory Visit (INDEPENDENT_AMBULATORY_CARE_PROVIDER_SITE_OTHER): Payer: Medicare Other | Admitting: Licensed Clinical Social Worker

## 2018-02-22 ENCOUNTER — Ambulatory Visit (INDEPENDENT_AMBULATORY_CARE_PROVIDER_SITE_OTHER): Payer: Medicare Other | Admitting: Infectious Diseases

## 2018-02-22 VITALS — BP 105/66 | HR 90 | Temp 98.2°F | Ht 74.0 in | Wt 191.1 lb

## 2018-02-22 DIAGNOSIS — I472 Ventricular tachycardia, unspecified: Secondary | ICD-10-CM

## 2018-02-22 DIAGNOSIS — E1142 Type 2 diabetes mellitus with diabetic polyneuropathy: Secondary | ICD-10-CM

## 2018-02-22 DIAGNOSIS — Z23 Encounter for immunization: Secondary | ICD-10-CM

## 2018-02-22 DIAGNOSIS — Z79899 Other long term (current) drug therapy: Secondary | ICD-10-CM

## 2018-02-22 DIAGNOSIS — B20 Human immunodeficiency virus [HIV] disease: Secondary | ICD-10-CM | POA: Diagnosis not present

## 2018-02-22 DIAGNOSIS — F331 Major depressive disorder, recurrent, moderate: Secondary | ICD-10-CM

## 2018-02-22 DIAGNOSIS — Z113 Encounter for screening for infections with a predominantly sexual mode of transmission: Secondary | ICD-10-CM

## 2018-02-22 DIAGNOSIS — L97411 Non-pressure chronic ulcer of right heel and midfoot limited to breakdown of skin: Secondary | ICD-10-CM

## 2018-02-22 DIAGNOSIS — E11621 Type 2 diabetes mellitus with foot ulcer: Secondary | ICD-10-CM

## 2018-02-22 NOTE — Assessment & Plan Note (Signed)
A1C 12-2017 5.1%.  Will get him back with his new PCP

## 2018-02-22 NOTE — Assessment & Plan Note (Signed)
I am concerned about his syncopal episodes.  He will call his PCP and CV

## 2018-02-22 NOTE — Progress Notes (Signed)
Prevnar-13 vaccine administered to Left deltoid, patient tolerated well. S.Darius Lundberg, LPN

## 2018-02-22 NOTE — Assessment & Plan Note (Signed)
He denies having a wound to his foot.  Will get him into podiatry.

## 2018-02-22 NOTE — Assessment & Plan Note (Signed)
Doing fairly well He has disclosed to new partner U=U Offered/refused condoms. , has drawers full.  PCV today rtc in 6 months.

## 2018-02-22 NOTE — Progress Notes (Signed)
   Subjective:    Patient ID: Johnathan LarsenGary W [name], male    DOB: [birth year], 57 y.o.   MRN: [id number]  HPI 57yo M with neuropathy, HIV/AIDS, DM2 (diet controlled), suspected Marfan's syndrome, and COPD. He was started on complera 2012. Then changed to tivicay/Descovy 06-2015. Was in hospital 09-2013 with sustained VT. His cath was normal.  His last Echo was12-10-2015: - Left ventricle: The cavity size was mildly dilated. Systolic function was normal. The estimated ejection fraction was in the range of 50% to 55%. Akinesis of the basal-midinferior myocardium. - Mitral valve: The findings are consistent with mild stenosis. There was mild to moderate regurgitation. - Left atrium: The atrium was mildly to moderately dilated  06-2016 he had MSSA bacteremia related to a R fifth toe ulcer. His TEE was (-). He underwent amputation.  Continues to smoke cigarettes after e-cig were too expensive. He has COPD. He had CXR chest 05-12-17 which showed bullous changes. He has pulmonary f/u. Currently at 3/4 ppd.   He has found new male friend, doing lot's of work at his house. Has had new sexual relationship, with "protection". Was told his "numbers are good".  He has had 2 syncopal episodes. Gets heated, ringing in his ears, was SOB, no CP.   Has recently started testosterone gel due to low testosterone level. Since his depression, mood, and memory have been better. His ED is also a little better.   He also now has wound on his R foot. ~ 1 week. No d/c from wound. "has been very pink and very healthy".   Worried he has mold in his appt.    HIV 1 RNA Quant (copies/mL)  Date Value  01/31/2018 <20 NOT DETECTED  04/18/2017 26 (H)  07/13/2016 <20   CD4 T Cell Abs (/uL)  Date Value  01/31/2018 140 (L)  04/18/2017 130 (L)  07/13/2016 140 (L)    Review of Systems  Constitutional: Negative for chills and fever.  Respiratory: Positive for shortness of breath.   Gastrointestinal: Negative for  constipation and diarrhea.  Genitourinary: Negative for difficulty urinating.  Skin: Positive for wound.  Neurological: Positive for dizziness and light-headedness.  Psychiatric/Behavioral: Negative for hallucinations.  Please see HPI. All other systems reviewed and negative.      Objective:   Physical Exam  Constitutional: He appears well-developed and well-nourished. No distress.  HENT:  Mouth/Throat: No oropharyngeal exudate.  Eyes: Pupils are equal, round, and reactive to light. EOM are normal.  Neck: Normal range of motion. Neck supple.  Cardiovascular: Normal rate, regular rhythm and normal heart sounds.  Pulmonary/Chest: Effort normal and breath sounds normal.  Abdominal: Soft. Bowel sounds are normal. There is no tenderness. There is no guarding.  Musculoskeletal: He exhibits edema.  Skin: No rash noted.            Assessment & Plan:

## 2018-02-22 NOTE — Assessment & Plan Note (Signed)
Will get him in with Rene KocherRegina today  he is animated today.

## 2018-02-22 NOTE — Addendum Note (Signed)
Addended by: Valarie ConesSTALEY, Pearlina Friedly on: 02/22/2018 03:53 PM   Modules accepted: Orders
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
## 2018-03-08 ENCOUNTER — Other Ambulatory Visit: Payer: Self-pay | Admitting: Infectious Diseases

## 2018-03-08 ENCOUNTER — Other Ambulatory Visit: Payer: Self-pay

## 2018-03-08 DIAGNOSIS — I952 Hypotension due to drugs: Secondary | ICD-10-CM

## 2018-03-08 DIAGNOSIS — R110 Nausea: Secondary | ICD-10-CM

## 2018-03-08 DIAGNOSIS — R000 Tachycardia, unspecified: Secondary | ICD-10-CM

## 2018-03-08 MED ORDER — METOPROLOL TARTRATE 25 MG PO TABS: ORAL_TABLET | ORAL | 5 refills | Status: DC

## 2018-03-12 ENCOUNTER — Other Ambulatory Visit: Payer: Self-pay | Admitting: Family Medicine

## 2018-03-12 DIAGNOSIS — I952 Hypotension due to drugs: Secondary | ICD-10-CM

## 2018-03-12 DIAGNOSIS — R000 Tachycardia, unspecified: Secondary | ICD-10-CM

## 2018-03-12 MED ORDER — METOPROLOL TARTRATE 25 MG PO TABS: ORAL_TABLET | ORAL | 5 refills | Status: DC

## 2018-03-12 NOTE — Progress Notes (Signed)
Refilled with BID instructions

## 2018-03-20 ENCOUNTER — Other Ambulatory Visit: Payer: Self-pay | Admitting: Infectious Diseases

## 2018-03-20 ENCOUNTER — Telehealth: Payer: Self-pay | Admitting: *Deleted

## 2018-03-20 DIAGNOSIS — G589 Mononeuropathy, unspecified: Secondary | ICD-10-CM

## 2018-03-20 NOTE — Telephone Encounter (Signed)
Urine tox please, will enter

## 2018-03-20 NOTE — Telephone Encounter (Signed)
Patient called for refill of his monthly pain medication. Please advise if you need any labs/urine tox screen first. Andree CossHowell, Yarisa Lynam M, RN

## 2018-03-21 ENCOUNTER — Telehealth: Payer: Self-pay

## 2018-03-21 DIAGNOSIS — G629 Polyneuropathy, unspecified: Secondary | ICD-10-CM

## 2018-03-21 MED ORDER — MORPHINE SULFATE ER 40 MG PO CP24: 40.0000 mg | ORAL_CAPSULE | Freq: Two times a day (BID) | ORAL | 0 refills | Status: DC

## 2018-03-21 NOTE — Telephone Encounter (Signed)
Johnathan JoinerRebecca [name] picking up medication for [name].  He will need drug screen at next office visit.   Johnathan Josephsammy K Calah Gershman, RN

## 2018-04-04 ENCOUNTER — Other Ambulatory Visit: Payer: Self-pay | Admitting: Infectious Diseases

## 2018-04-04 DIAGNOSIS — I10 Essential (primary) hypertension: Secondary | ICD-10-CM

## 2018-04-17 ENCOUNTER — Telehealth: Payer: Self-pay | Admitting: *Deleted

## 2018-04-17 DIAGNOSIS — G629 Polyneuropathy, unspecified: Secondary | ICD-10-CM

## 2018-04-17 MED ORDER — MORPHINE SULFATE ER 40 MG PO CP24: 40.0000 mg | ORAL_CAPSULE | Freq: Two times a day (BID) | ORAL | 0 refills | Status: DC

## 2018-04-17 NOTE — Telephone Encounter (Signed)
Patient called, left message for refill of morphine. He would like to pick it up Thursday, 04/19/18.Marland Kitchen Per chart, patient is due for urine screen - order already entered by Dr Ninetta Lights. RN returned patient's call, left voice mail offering to have him get his flu shot when he comes in Thursday to pick up refill. Will print prescription for Dr Ninetta Lights to fill. Andree Coss, RN

## 2018-04-19 ENCOUNTER — Other Ambulatory Visit: Payer: Medicare Other

## 2018-04-19 ENCOUNTER — Ambulatory Visit (INDEPENDENT_AMBULATORY_CARE_PROVIDER_SITE_OTHER): Payer: Medicare Other

## 2018-04-19 DIAGNOSIS — Z23 Encounter for immunization: Secondary | ICD-10-CM

## 2018-04-19 DIAGNOSIS — G589 Mononeuropathy, unspecified: Secondary | ICD-10-CM

## 2018-04-19 NOTE — Progress Notes (Signed)
Flu vaccine administered. Patient tolerated well. Urine collected for lab and Hard script picked up. Patient reminded of upcoming appointment in Feb 2020. Patient very appreciative of service.  S.Fayetta Sorenson, LPN

## 2018-04-22 LAB — PAIN MGMT, PROFILE 8 W/CONF, U
6 Acetylmorphine: NEGATIVE ng/mL (ref <10)
ALPHAHYDROXYALPRAZOLAM: 257 ng/mL — AB (ref <25)
ALPHAHYDROXYMIDAZOLAM: NEGATIVE ng/mL (ref <50)
AMINOCLONAZEPAM: 440 ng/mL — AB (ref <25)
Alcohol Metabolites: NEGATIVE ng/mL (ref <500)
Alphahydroxytriazolam: NEGATIVE ng/mL (ref <50)
Amphetamines: NEGATIVE ng/mL (ref <500)
BENZODIAZEPINES: POSITIVE ng/mL — AB (ref <100)
BUPRENORPHINE, URINE: NEGATIVE ng/mL (ref <5)
Cocaine Metabolite: NEGATIVE ng/mL (ref <150)
Codeine: NEGATIVE ng/mL (ref <50)
Creatinine: 93.1 mg/dL
HYDROCODONE: NEGATIVE ng/mL (ref <50)
HYDROMORPHONE: 213 ng/mL — AB (ref <50)
Hydroxyethylflurazepam: NEGATIVE ng/mL (ref <50)
LORAZEPAM: NEGATIVE ng/mL (ref <50)
MDMA: NEGATIVE ng/mL (ref <500)
Marijuana Metabolite: NEGATIVE ng/mL (ref <20)
Morphine: >20000 ng/mL — ABNORMAL HIGH (ref <50)
Nordiazepam: NEGATIVE ng/mL (ref <50)
Norhydrocodone: NEGATIVE ng/mL (ref <50)
OXYCODONE: NEGATIVE ng/mL (ref <100)
Opiates: POSITIVE ng/mL — AB (ref <100)
Oxazepam: NEGATIVE ng/mL (ref <50)
Oxidant: NEGATIVE ug/mL (ref <200)
TEMAZEPAM: NEGATIVE ng/mL (ref <50)
pH: 6.79 (ref 4.5–9.0)

## 2018-04-25 ENCOUNTER — Ambulatory Visit: Payer: Medicare Other | Admitting: Podiatry

## 2018-04-30 ENCOUNTER — Other Ambulatory Visit: Payer: Self-pay | Admitting: Family Medicine

## 2018-04-30 ENCOUNTER — Encounter: Payer: Self-pay | Admitting: Podiatry

## 2018-04-30 ENCOUNTER — Ambulatory Visit (INDEPENDENT_AMBULATORY_CARE_PROVIDER_SITE_OTHER): Payer: Medicare Other

## 2018-04-30 ENCOUNTER — Ambulatory Visit (INDEPENDENT_AMBULATORY_CARE_PROVIDER_SITE_OTHER): Payer: Medicare Other | Admitting: Podiatry

## 2018-04-30 ENCOUNTER — Other Ambulatory Visit: Payer: Self-pay | Admitting: Infectious Diseases

## 2018-04-30 DIAGNOSIS — B351 Tinea unguium: Secondary | ICD-10-CM | POA: Diagnosis not present

## 2018-04-30 DIAGNOSIS — M79672 Pain in left foot: Secondary | ICD-10-CM

## 2018-04-30 DIAGNOSIS — M79675 Pain in left toe(s): Secondary | ICD-10-CM

## 2018-04-30 DIAGNOSIS — M79671 Pain in right foot: Secondary | ICD-10-CM | POA: Diagnosis not present

## 2018-04-30 DIAGNOSIS — M79674 Pain in right toe(s): Secondary | ICD-10-CM | POA: Diagnosis not present

## 2018-04-30 DIAGNOSIS — I10 Essential (primary) hypertension: Secondary | ICD-10-CM

## 2018-04-30 DIAGNOSIS — E1142 Type 2 diabetes mellitus with diabetic polyneuropathy: Secondary | ICD-10-CM

## 2018-04-30 DIAGNOSIS — B20 Human immunodeficiency virus [HIV] disease: Secondary | ICD-10-CM

## 2018-04-30 NOTE — Progress Notes (Signed)
Subjective:   Patient ID: [name], male   DOB: 57 y.o.   MRN: [id number]   HPI Long-term diabetic with neuropathic changes he is already lost his fifth toe due to infection and has nail disease 1 through 5 left 2 through 5 right with generalized pain in his feet secondary to the ulcer he experienced.  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  Will get approval for diabetic shoes which are necessary for this patient with long-term diabetic neuropathy

## 2018-05-01 ENCOUNTER — Ambulatory Visit: Payer: Self-pay | Admitting: Family Medicine

## 2018-05-09 ENCOUNTER — Telehealth: Payer: Self-pay | Admitting: Podiatry

## 2018-05-09 NOTE — Telephone Encounter (Signed)
lvm for pt that the doctor (Dr Hyman Hopes) that we sent diabetic shoe paperwork to sent it back stating you are not a patient there.  Can you please call me back to confirm who and when you are seeing the new provider so I can send over the diabetic shoe paperwork.

## 2018-05-11 ENCOUNTER — Ambulatory Visit: Payer: Self-pay | Admitting: Family Medicine

## 2018-05-15 ENCOUNTER — Telehealth: Payer: Self-pay | Admitting: *Deleted

## 2018-05-15 ENCOUNTER — Other Ambulatory Visit: Payer: Self-pay | Admitting: Infectious Diseases

## 2018-05-15 DIAGNOSIS — G629 Polyneuropathy, unspecified: Secondary | ICD-10-CM

## 2018-05-15 DIAGNOSIS — Z79899 Other long term (current) drug therapy: Secondary | ICD-10-CM

## 2018-05-15 MED ORDER — MORPHINE SULFATE ER 40 MG PO CP24: 40.0000 mg | ORAL_CAPSULE | Freq: Two times a day (BID) | ORAL | 0 refills | Status: DC

## 2018-05-15 NOTE — Telephone Encounter (Signed)
rx written, handed to you A UDS is ordered as well

## 2018-05-15 NOTE — Telephone Encounter (Signed)
Patient asking for refill of morphine 40 mg #60, would like to pick it up 11/14. Please advise if ok to fill, if you need any orders. Andree CossHowell, Jaskirat Zertuche M, RN

## 2018-05-15 NOTE — Telephone Encounter (Signed)
UDS not needed at this time per Dr Ninetta Lights. Patient notified prescription will be ready to pick up Thursday.

## 2018-05-15 NOTE — Progress Notes (Signed)
.   Reviewed the [place] narcotic database, the only narcotic prescribers in the last year were [facility]

## 2018-05-28 ENCOUNTER — Other Ambulatory Visit (HOSPITAL_COMMUNITY): Payer: Self-pay | Admitting: Psychiatry

## 2018-06-13 ENCOUNTER — Other Ambulatory Visit: Payer: Self-pay

## 2018-06-13 ENCOUNTER — Ambulatory Visit: Payer: Self-pay | Admitting: Family Medicine

## 2018-06-13 DIAGNOSIS — G629 Polyneuropathy, unspecified: Secondary | ICD-10-CM

## 2018-06-13 MED ORDER — MORPHINE SULFATE ER 40 MG PO CP24: 40.0000 mg | ORAL_CAPSULE | Freq: Two times a day (BID) | ORAL | 0 refills | Status: DC

## 2018-06-13 NOTE — Telephone Encounter (Addendum)
Patient called office today requesting a new script for Morphine Sulfate 40 mg. States that his prescription expired on 12/14 and would like to come into the office on 12/13 to pick up new script. Will print of prescription and leave it in Triage folder for Dr. Ninetta LightsHatcher to sign. [name], [place]

## 2018-06-15 ENCOUNTER — Other Ambulatory Visit: Payer: Self-pay

## 2018-06-15 ENCOUNTER — Ambulatory Visit: Payer: Self-pay | Admitting: Cardiovascular Disease

## 2018-06-15 DIAGNOSIS — G629 Polyneuropathy, unspecified: Secondary | ICD-10-CM

## 2018-06-15 MED ORDER — MORPHINE SULFATE ER 40 MG PO CP24: 40.0000 mg | ORAL_CAPSULE | Freq: Two times a day (BID) | ORAL | 0 refills | Status: DC

## 2018-06-21 ENCOUNTER — Other Ambulatory Visit (HOSPITAL_COMMUNITY): Payer: Self-pay | Admitting: Psychiatry

## 2018-06-22 ENCOUNTER — Other Ambulatory Visit: Payer: Self-pay | Admitting: Family Medicine

## 2018-06-22 NOTE — Telephone Encounter (Signed)
0

## 2018-06-22 NOTE — Telephone Encounter (Signed)
Please advise if this can be refilled. Thanks!  

## 2018-06-25 ENCOUNTER — Other Ambulatory Visit: Payer: Self-pay | Admitting: Family Medicine

## 2018-06-25 NOTE — Telephone Encounter (Signed)
Andre, Please advise. Thanks!  

## 2018-06-29 NOTE — Progress Notes (Deleted)
Cardiology Office Note   Date:  06/29/2018   ID:  Marcas, Bowsher Jan 19, 1961, MRN [id number]  PCP:  Dorena Dew, FNP  Cardiologist:   Marge Duncans chief complaint on file.     History of Present Illness:  57 y.o. history of VT and MR. Marfans No CAD by cath in 2015.  MRI with no scar.  Last TTE done 12/29/17 Reviewed and EF 55-60% noted MAC with mild MR previously described prolapse not as apparent.   ***    Past Medical History:  Diagnosis Date  . Cholesteatoma   . 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 COPD (chronic obstructive pulmonary disease) ([place])   . Depression   . Folliculitis barbae 03/09/2228  . GERD (gastroesophageal reflux disease) 06/09/2015  . HIV (human immunodeficiency virus infection) ([place])   . ILD (interstitial lung disease) ([place]) 12/08/2014  . Marfan syndrome    Dr Johnnye Sima follows  . Mitral valve prolapse   . Rhinosinusitis 12/08/2014  . Venous insufficiency (chronic) (peripheral)   . VT (ventricular tachycardia) ([place])     Past Surgical History:  Procedure Laterality Date  . AMPUTATION Right 06/06/2016   Procedure: AMPUTATION RAY RIGHT FIFTH RAY;  Surgeon: Newt Minion, MD;  Location: [facility] ORS;  Service: Orthopedics;  Laterality: Right;  . APPENDECTOMY    . cholesteotoma removal    . LEFT HEART CATHETERIZATION WITH CORONARY ANGIOGRAM [name] 09/24/2013   Procedure: LEFT HEART CATHETERIZATION WITH CORONARY ANGIOGRAM;  Surgeon: Leonie Man, MD;  Location[facility] C[facility] LAB;  Service: Cardiovascular;  Laterality: [name];  . repair of perforated colon    . TEE WITHOUT CARDIOVERSION [name] 06/09/2016   Procedure: TRANSESOPHAGEAL ECHOCARDIOGRAM (TEE);  Surgeon: Josue Hector, MD;  Location: [facility] ENDOSCOPY;  Service: Cardiovascular;  Laterality: [name];     Current Outpatient Medications  Medication Sig Dispense Refill  . albuterol (PROVENTIL HFA;VENTOLIN HFA) 108 (90 Base) MCG/ACT inhaler Inhale 2 puffs every 6 (six) hours as  needed into the lungs for wheezing or shortness of breath. 1 Inhaler 0  . ALPRAZolam (XANAX) 0.5 MG tablet Take 0.5 mg by mouth 3 (three) times daily as needed for anxiety.    . clonazePAM (KLONOPIN) 1 MG tablet Take 1 mg by mouth at bedtime.    . DESCOVY 200-25 MG tablet TAKE 1 TABLET BY MOUTH DAILY. 30 tablet 11  . desonide (DESOWEN) 0.05 % cream Apply to affected area 2 times daily 60 g 2  . furosemide (LASIX) 20 MG tablet TAKE 1 TABLET BY MOUTH DAILY 30 tablet 5  . Glycopyrrolate-Formoterol (BEVESPI AEROSPHERE) 9-4.8 MCG/ACT AERO Inhale 2 puffs into the lungs 2 (two) times daily. 1 Inhaler 0  . metoprolol tartrate (LOPRESSOR) 25 MG tablet Take one tablet BID 60 tablet 5  . Morphine Sulfate 40 MG CP24 Take 40 mg by mouth 2 (two) times daily. 60 capsule 0  . Multiple Vitamins-Minerals (MULTIVITAMIN WITH MINERALS) tablet Take 1 tablet by mouth daily.    Marland Kitchen NARCAN 4 MG/0.1ML LIQD nasal spray kit   0  . nortriptyline (PAMELOR) 75 MG capsule Take 150 mg by mouth at bedtime.    . Omega-3 Fatty Acids (FISH OIL) 1000 MG CAPS Take 1 capsule by mouth at bedtime.    . promethazine (PHENERGAN) 25 MG tablet TAKE 1 TABLET BY MOUTH EVERY 8 HOURS AS NEEDED. 30 tablet 4  . spironolactone (ALDACTONE) 25 MG tablet TAKE 1 TABLET BY MOUTH ONCE DAILY. 30 tablet 5  .  testosterone (ANDROGEL) 50 MG/5GM (1%) GEL Place 5 g onto the skin daily. 1 Tube 5  . Testosterone 12.5 MG/ACT (1%) GEL   5  . TIVICAY 50 MG tablet TAKE 1 TABLET BY MOUTH DAILY. 30 tablet 4   No current facility-administered medications for this visit.     Allergies:   Tetracycline hcl    Social History:  The patient  reports that he has been smoking cigarettes. He has a 8.75 pack-year smoking history. He has never used smokeless tobacco. He reports that he does not drink alcohol or use drugs.   Family History:  The patient's family history includes Aneurysm in his sister; Cancer in his mother and sister; Hypertension in his mother and sister;  Stroke in his sister.    ROS:  Please see the history of present illness.   Otherwise, review of systems are positive for none.   All other systems are reviewed and negative.    PHYSICAL EXAM: VS:  There were no vitals taken for this visit. , BMI There is no height or weight on file to calculate BMI. Affect appropriate Healthy:  appears stated age 10: normal Neck supple with no adenopathy JVP normal no bruits no thyromegaly Lungs clear with no wheezing and good diaphragmatic motion Heart:  S1/S2 MR murmur, no rub, gallop or click PMI normal Abdomen: benighn, BS positve, no tenderness, no AAA no bruit.  No HSM or HJR Distal pulses intact with no bruits No edema Neuro non-focal Skin warm and dry No muscular weakness Small eschar on 5th metatarsal head post amputation    EKG:  06/11/16 SR rate 92 nonspecific IVCD normal ST segments no AV block   Recent Labs: 01/31/2018: ALT 7; BUN 11; Creat 1.16; Hemoglobin 14.0; Platelets 227; Potassium 4.8; Sodium 139    Lipid Panel    Component Value Date/Time   CHOL 166 01/31/2018 1117   TRIG 105 01/31/2018 1117   HDL 38 (L) 01/31/2018 1117   CHOLHDL 4.4 01/31/2018 1117   VLDL 19 07/13/2016 1608   LDLCALC 108 (H) 01/31/2018 1117      Wt Readings from Last 3 Encounters:  02/22/18 191 lb 1.3 oz (86.7 kg)  02/14/18 190 lb (86.2 kg)  12/11/17 195 lb (88.5 kg)      Other studies Reviewed: Additional studies/ records that were reviewed today include: Notes Dr Lovena Le Notes prolonged hospitalization in December 2017 TEE and echo results .    ASSESSMENT AND PLAN:  1. VT quiescent no palpitations monitor on beta blocker f/u GT 2. MVP - June 2019 only mild MR posterior prolapse not apparent improved  3. ILD/COPD no dyspnea or wheezing lung exam ok today  4. AIDS  Lab work with primary RNA quant 07/13/16 <20 5. Marfans echo with normal aortic root no prolapse noted Aoritc root was normal on MRI 2015 7. Anxiety / Depression   Continue pamelor and xanax doing better  8. Osteomyelitis:  History of amputation f/u Dr Sharol Given   Current medicines are reviewed at length with the patient today.  The patient does not have concerns regarding medicines.  The following changes have been made:  no change  Labs/ tests ordered today include: None   No orders of the defined types were placed in this encounter.    Disposition:   FU with cardiology in a year     Signed, Jenkins Rouge, MD  06/29/2018 1:48 PM    [address], [place], [place]  [address]  Phone: [phone]; Fax: [phone]

## 2018-07-02 ENCOUNTER — Ambulatory Visit: Payer: Self-pay | Admitting: Cardiovascular Disease

## 2018-07-03 ENCOUNTER — Encounter: Payer: Self-pay | Admitting: Cardiovascular Disease

## 2018-07-11 ENCOUNTER — Encounter: Payer: Self-pay | Admitting: Internal Medicine

## 2018-07-11 ENCOUNTER — Encounter (INDEPENDENT_AMBULATORY_CARE_PROVIDER_SITE_OTHER): Payer: Self-pay

## 2018-07-11 ENCOUNTER — Ambulatory Visit (INDEPENDENT_AMBULATORY_CARE_PROVIDER_SITE_OTHER): Payer: Medicare Other | Admitting: Internal Medicine

## 2018-07-11 VITALS — BP 124/64 | HR 80 | Ht 74.0 in | Wt 186.0 lb

## 2018-07-11 DIAGNOSIS — I341 Nonrheumatic mitral (valve) prolapse: Secondary | ICD-10-CM

## 2018-07-11 DIAGNOSIS — I472 Ventricular tachycardia, unspecified: Secondary | ICD-10-CM

## 2018-07-11 NOTE — Progress Notes (Signed)
HPI [name] returns after a 3 year absence from our EP clinic. He is a pleasant 58yo man with HIV infection, Marfan syndrome, remote VT, and MR. He has had problems with lower extremity skin infections leading to bone infections. He is s/p foot surgery. He denies chest pain or sob. He has had 2 additional episodes of syncope which lasted a few seconds.  Allergies  Allergen Reactions  . Tetracycline Hcl Swelling    Lips, facial swelling     Current Outpatient Medications  Medication Sig Dispense Refill  . albuterol (PROVENTIL HFA;VENTOLIN HFA) 108 (90 Base) MCG/ACT inhaler Inhale 2 puffs every 6 (six) hours as needed into the lungs for wheezing or shortness of breath. 1 Inhaler 0  . ALPRAZolam (XANAX) 0.5 MG tablet Take 0.5 mg by mouth 3 (three) times daily as needed for anxiety.    . clonazePAM (KLONOPIN) 1 MG tablet Take 1 mg by mouth at bedtime.    . DESCOVY 200-25 MG tablet TAKE 1 TABLET BY MOUTH DAILY. 30 tablet 11  . desonide (DESOWEN) 0.05 % cream Apply to affected area 2 times daily 60 g 2  . furosemide (LASIX) 20 MG tablet TAKE 1 TABLET BY MOUTH DAILY 30 tablet 5  . Glycopyrrolate-Formoterol (BEVESPI AEROSPHERE) 9-4.8 MCG/ACT AERO Inhale 2 puffs into the lungs 2 (two) times daily. 1 Inhaler 0  . metoprolol tartrate (LOPRESSOR) 25 MG tablet Take one tablet BID 60 tablet 5  . Morphine Sulfate 40 MG CP24 Take 40 mg by mouth 2 (two) times daily. 60 capsule 0  . Multiple Vitamins-Minerals (MULTIVITAMIN WITH MINERALS) tablet Take 1 tablet by mouth daily.    Marland Kitchen NARCAN 4 MG/0.1ML LIQD nasal spray kit   0  . nortriptyline (PAMELOR) 75 MG capsule Take 150 mg by mouth at bedtime.    . Omega-3 Fatty Acids (FISH OIL) 1000 MG CAPS Take 1 capsule by mouth at bedtime.    Marland Kitchen spironolactone (ALDACTONE) 25 MG tablet TAKE 1 TABLET BY MOUTH ONCE DAILY. 30 tablet 5  . testosterone (ANDROGEL) 50 MG/5GM (1%) GEL Place 5 g onto the skin daily. 1 Tube 5  . Testosterone 12.5 MG/ACT (1%) GEL   5  .  TIVICAY 50 MG tablet TAKE 1 TABLET BY MOUTH DAILY. 30 tablet 4  . promethazine (PHENERGAN) 25 MG tablet TAKE 1 TABLET BY MOUTH EVERY 8 HOURS AS NEEDED. (Patient not taking: Reported on 07/11/2018) 30 tablet 4   No current facility-administered medications for this visit.      Past Medical History:  Diagnosis Date  . Cholesteatoma   . 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 COPD (chronic obstructive pulmonary disease) ([place])   . Depression   . Folliculitis barbae 0/09/4740  . GERD (gastroesophageal reflux disease) 06/09/2015  . HIV (human immunodeficiency virus infection) ([place])   . ILD (interstitial lung disease) ([place]) 12/08/2014  . Marfan syndrome    Dr Johnnye Sima follows  . Mitral valve prolapse   . Rhinosinusitis 12/08/2014  . Venous insufficiency (chronic) (peripheral)   . VT (ventricular tachycardia) ([facility])     ROS:   All systems reviewed and negative except as noted in the HPI.   Past Surgical History:  Procedure Laterality Date  . AMPUTATION Right 06/06/2016   Procedure: AMPUTATION RAY RIGHT FIFTH RAY;  Surgeon: Newt Minion, MD;  Location: [facility] ORS;  Service: Orthopedics;  Laterality: Right;  . APPENDECTOMY    . cholesteotoma removal    . LEFT  HEART CATHETERIZATION WITH CORONARY ANGIOGRAM [name] 09/24/2013   Procedure: LEFT HEART CATHETERIZATION WITH CORONARY ANGIOGRAM;  Surgeon: Leonie Man, MD;  Location: [facility] CATH LAB;  Service: Cardiovascular;  Laterality: [name];  . repair of perforated colon    . TEE WITHOUT CARDIOVERSION [name] 06/09/2016   Procedure: TRANSESOPHAGEAL ECHOCARDIOGRAM (TEE);  Surgeon: Josue Hector, MD;  Location: [facility] ENDOSCOPY;  Service: Cardiovascular;  Laterality: [name];     Family History  Problem Relation Age of Onset  . Cancer Mother   . Hypertension Mother   . Stroke Sister   . Hypertension Sister   . Aneurysm Sister   . Cancer Sister        laryngeal     Social History   Socioeconomic History  . Marital status:  Single    Spouse name: Not on file  . Number of children: Not on file  . Years of education: Not on file  . Highest education level: Not on file  Occupational History  . Not on file  Social Needs  . Financial resource strain: Not on file  . Food insecurity:    Worry: Not on file    Inability: Not on file  . Transportation needs:    Medical: Not on file    Non-medical: Not on file  Tobacco Use  . Smoking status: Current Some Day Smoker    Packs/day: 0.25    Years: 35.00    Pack years: 8.75    Types: Cigarettes  . Smokeless tobacco: Never Used  Substance and Sexual Activity  . Alcohol use: No    Alcohol/week: 0.0 standard drinks  . Drug use: No  . Sexual activity: Never    Comment: pt. declined condoms  Lifestyle  . Physical activity:    Days per week: Not on file    Minutes per session: Not on file  . Stress: Not on file  Relationships  . Social connections:    Talks on phone: Not on file    Gets together: Not on file    Attends religious service: Not on file    Active member of club or organization: Not on file    Attends meetings of clubs or organizations: Not on file    Relationship status: Not on file  . Intimate partner violence:    Fear of current or ex partner: Not on file    Emotionally abused: Not on file    Physically abused: Not on file    Forced sexual activity: Not on file  Other Topics Concern  . Not on file  Social History Narrative  . Not on file     BP 124/64   Pulse 80   Ht '6\' 2"'  (1.88 m)   Wt 186 lb (84.4 kg)   BMI 23.88 kg/m   Physical Exam:  Well appearing diskempt middle aged man, who looks older than his stated age, NAD HEENT: Unremarkable Neck:  6 cm JVD, no thyromegally Lymphatics:  No adenopathy Back:  No CVA tenderness Lungs:  Clear with no wheezes HEART:  Regular rate rhythm, 2/6 MR murmur, no rubs, no clicks Abd:  soft, positive bowel sounds, no organomegally, no rebound, no guarding Ext:  2 plus pulses, no edema, no  cyanosis, no clubbing Skin:  No rashes no nodules Neuro:  CN II through XII intact, motor grossly intact  EKG - nsr   Assess/Plan: 1. MR - his echo demonstrates mild MR though his exam suggests moderate MR. He is asymptomatic. He will undergo watchful  waiting.  2. VT - he has had no recurrent symptoms.  3. Marfan syndrome - he does not have evidence of aortic enlargement. We will follow.  Mikle Bosworth.D.

## 2018-07-11 NOTE — Patient Instructions (Signed)

## 2018-07-12 ENCOUNTER — Other Ambulatory Visit: Payer: Self-pay | Admitting: Behavioral Health

## 2018-07-12 DIAGNOSIS — G629 Polyneuropathy, unspecified: Secondary | ICD-10-CM

## 2018-07-12 MED ORDER — MORPHINE SULFATE ER 40 MG PO CP24: 40.0000 mg | ORAL_CAPSULE | Freq: Two times a day (BID) | ORAL | 0 refills | Status: DC

## 2018-07-12 NOTE — Progress Notes (Signed)
Patient called requesting a refill for his Morphine.  He cannot refill medication until Sunday 07/15/2018 however we will be closed .  He is going to come to pick up the prescription tomorrow 07/13/2018 so he will have it for Sunday.  He is aware he cannot fill it until the and the prescription states this as well. Angeline Slim RN

## 2018-08-07 ENCOUNTER — Other Ambulatory Visit: Payer: Self-pay

## 2018-08-07 DIAGNOSIS — G629 Polyneuropathy, unspecified: Secondary | ICD-10-CM

## 2018-08-07 MED ORDER — MORPHINE SULFATE ER 40 MG PO CP24: 40.0000 mg | ORAL_CAPSULE | Freq: Two times a day (BID) | ORAL | 0 refills | Status: DC

## 2018-08-07 NOTE — Progress Notes (Signed)
Patient called requesting a refill for his Morphine.  He cannot refill medication until Tuesday  08/14/2018. He is going to come to pick up the prescription Friday 08/10/2018. He is aware he cannot fill it until then and the prescription states this as well. Script already signed and ready for patient to pick up in triage.  Valarie Cones, LPN

## 2018-08-09 ENCOUNTER — Other Ambulatory Visit: Payer: Self-pay

## 2018-08-09 ENCOUNTER — Other Ambulatory Visit: Payer: Medicare Other

## 2018-08-09 ENCOUNTER — Telehealth: Payer: Self-pay

## 2018-08-09 ENCOUNTER — Other Ambulatory Visit (HOSPITAL_COMMUNITY)
Admission: RE | Admit: 2018-08-09 | Discharge: 2018-08-09 | Disposition: A | Discharge status: Home / Self Care | Payer: Medicare Other | Source: Ambulatory Visit | Attending: Infectious Diseases | Admitting: Infectious Diseases

## 2018-08-09 ENCOUNTER — Other Ambulatory Visit: Payer: Self-pay | Admitting: Infectious Diseases

## 2018-08-09 DIAGNOSIS — G629 Polyneuropathy, unspecified: Secondary | ICD-10-CM

## 2018-08-09 DIAGNOSIS — Z113 Encounter for screening for infections with a predominantly sexual mode of transmission: Secondary | ICD-10-CM | POA: Insufficient documentation

## 2018-08-09 DIAGNOSIS — Z79899 Other long term (current) drug therapy: Secondary | ICD-10-CM

## 2018-08-09 DIAGNOSIS — B20 Human immunodeficiency virus [HIV] disease: Secondary | ICD-10-CM

## 2018-08-09 NOTE — Telephone Encounter (Signed)
Pharmacy calling to inform Dr Ninetta Lights  All morphine scripts for [name] must be submitted electronically.  They can no longer except paper scripts per [facility].   Per Dr Ninetta Lights forward script request to him via phone messages and he will send electronic script.   Laurell Josephs, RN

## 2018-08-10 LAB — T-HELPER CELL (CD4) - (RCID CLINIC ONLY)
CD4 % Helper T Cell: 10 % — ABNORMAL LOW (ref 33–55)
CD4 T Cell Abs: 190 /uL — ABNORMAL LOW (ref 400–2700)

## 2018-08-11 LAB — CBC
HCT: 45.6 % (ref 38.5–50.0)
Hemoglobin: 15.3 g/dL (ref 13.2–17.1)
MCH: 28.8 pg (ref 27.0–33.0)
MCHC: 33.6 g/dL (ref 32.0–36.0)
MCV: 85.7 fL (ref 80.0–100.0)
MPV: 9.5 fL (ref 7.5–12.5)
Platelets: 252 10*3/uL (ref 140–400)
RBC: 5.32 10*6/uL (ref 4.20–5.80)
RDW: 14.5 % (ref 11.0–15.0)
WBC: 7.7 10*3/uL (ref 3.8–10.8)

## 2018-08-11 LAB — LIPID PANEL
Cholesterol: 195 mg/dL (ref <200)
HDL: 41 mg/dL (ref >=40)
LDL Cholesterol (Calc): 129 mg/dL (calc) — ABNORMAL HIGH
Non-HDL Cholesterol (Calc): 154 mg/dL (calc) — ABNORMAL HIGH (ref <130)
TRIGLYCERIDES: 136 mg/dL (ref <150)
Total CHOL/HDL Ratio: 4.8 (calc) (ref <5.0)

## 2018-08-11 LAB — HIV-1 RNA QUANT-NO REFLEX-BLD
HIV 1 RNA Quant: <20 copies/mL
HIV-1 RNA Quant, Log: <1.3 Log copies/mL

## 2018-08-11 LAB — COMPREHENSIVE METABOLIC PANEL
AG Ratio: 1.4 (calc) (ref 1.0–2.5)
ALKALINE PHOSPHATASE (APISO): 128 U/L (ref 35–144)
ALT: 11 U/L (ref 9–46)
AST: 11 U/L (ref 10–35)
Albumin: 4.6 g/dL (ref 3.6–5.1)
BUN: 16 mg/dL (ref 7–25)
CO2: 27 mmol/L (ref 20–32)
Calcium: 10.5 mg/dL — ABNORMAL HIGH (ref 8.6–10.3)
Chloride: 99 mmol/L (ref 98–110)
Creat: 1.28 mg/dL (ref 0.70–1.33)
Globulin: 3.4 g/dL (calc) (ref 1.9–3.7)
Glucose, Bld: 85 mg/dL (ref 65–99)
Potassium: 4.7 mmol/L (ref 3.5–5.3)
Sodium: 137 mmol/L (ref 135–146)
Total Bilirubin: 0.5 mg/dL (ref 0.2–1.2)
Total Protein: 8 g/dL (ref 6.1–8.1)

## 2018-08-11 LAB — URINE CYTOLOGY ANCILLARY ONLY
Chlamydia: NEGATIVE
Neisseria Gonorrhea: NEGATIVE

## 2018-08-11 LAB — RPR: RPR Ser Ql: NONREACTIVE

## 2018-08-12 LAB — PAIN MGMT, PROFILE 8 W/CONF, U
6 Acetylmorphine: NEGATIVE ng/mL (ref <10)
Alcohol Metabolites: NEGATIVE ng/mL (ref <500)
Alphahydroxyalprazolam: 350 ng/mL — ABNORMAL HIGH (ref <25)
Alphahydroxymidazolam: NEGATIVE ng/mL (ref <50)
Alphahydroxytriazolam: NEGATIVE ng/mL (ref <50)
Aminoclonazepam: 510 ng/mL — ABNORMAL HIGH (ref <25)
Amphetamines: NEGATIVE ng/mL (ref <500)
Benzodiazepines: POSITIVE ng/mL — AB (ref <100)
Buprenorphine, Urine: NEGATIVE ng/mL (ref <5)
CODEINE: NEGATIVE ng/mL (ref <50)
Cocaine Metabolite: NEGATIVE ng/mL (ref <150)
Creatinine: 107.4 mg/dL
HYDROMORPHONE: 402 ng/mL — AB (ref <50)
Hydrocodone: NEGATIVE ng/mL (ref <50)
Hydroxyethylflurazepam: NEGATIVE ng/mL (ref <50)
LORAZEPAM: NEGATIVE ng/mL (ref <50)
MDMA: NEGATIVE ng/mL (ref <500)
Marijuana Metabolite: NEGATIVE ng/mL (ref <20)
Morphine: 39195 ng/mL — ABNORMAL HIGH (ref <50)
Nordiazepam: NEGATIVE ng/mL (ref <50)
Norhydrocodone: NEGATIVE ng/mL (ref <50)
Opiates: POSITIVE ng/mL — AB (ref <100)
Oxazepam: NEGATIVE ng/mL (ref <50)
Oxidant: NEGATIVE ug/mL (ref <200)
Oxycodone: NEGATIVE ng/mL (ref <100)
Temazepam: NEGATIVE ng/mL (ref <50)
pH: 6.47 (ref 4.5–9.0)

## 2018-08-23 ENCOUNTER — Encounter: Payer: Medicare Other | Admitting: Infectious Diseases

## 2018-08-24 ENCOUNTER — Encounter: Payer: Self-pay | Admitting: Infectious Diseases

## 2018-08-24 ENCOUNTER — Ambulatory Visit (INDEPENDENT_AMBULATORY_CARE_PROVIDER_SITE_OTHER): Payer: Medicare Other | Admitting: Infectious Diseases

## 2018-08-24 DIAGNOSIS — F17210 Nicotine dependence, cigarettes, uncomplicated: Secondary | ICD-10-CM | POA: Diagnosis not present

## 2018-08-24 DIAGNOSIS — F331 Major depressive disorder, recurrent, moderate: Secondary | ICD-10-CM | POA: Diagnosis not present

## 2018-08-24 DIAGNOSIS — B20 Human immunodeficiency virus [HIV] disease: Secondary | ICD-10-CM | POA: Diagnosis not present

## 2018-08-24 DIAGNOSIS — E1142 Type 2 diabetes mellitus with diabetic polyneuropathy: Secondary | ICD-10-CM

## 2018-08-24 DIAGNOSIS — L97511 Non-pressure chronic ulcer of other part of right foot limited to breakdown of skin: Secondary | ICD-10-CM

## 2018-08-24 NOTE — Assessment & Plan Note (Signed)
This has resolved. Will mark as so.

## 2018-08-24 NOTE — Progress Notes (Signed)
   Subjective:    Patient ID: [name], male    DOB: 07-May-1961, 58 y.o.   MRN: [id number]  HPI 57yo M with neuropathy, HIV/AIDS, DM2 (diet controlled), suspected Marfan's syndrome, and COPD. He was started on complera 2012. Then changed to tivicay/Descovy 06-2015, which he continues on. Was in hospital 09-2013 with sustained VT. His cath was normal.  His last Echo was12-10-2015: - Left ventricle: The cavity size was mildly dilated. Systolic function was normal. The estimated ejection fraction was in the range of 50% to 55%. Akinesis of the basal-midinferior myocardium. - Mitral valve: The findings are consistent with mild stenosis. There was mild to moderate regurgitation. - Left atrium: The atrium was mildly to moderately dilated  Continues to smoke cigarettes after e-cig were too expensive. He has COPD. He had CXR chest 05-12-17 which showed bullous changes. He has pulmonary f/u. Currently at 3/4 ppd.    Has recently started testosterone gel due to low testosterone level. His ED is also a little better.   He also now has wound on his R foot.   He has been having cough and sneezing. Has been taking OTC and feeling some better.   His most recent partner OD'ed on fentayl, he was also drinking heavily. He came out of detox, and then started using again. He is very depressed about this. He also has guilt about giving/not giving narcan to him.  He is working on getting new housing due to drug use in his complex [place]).    He needs new PCP.   HIV 1 RNA Quant (copies/mL)  Date Value  08/09/2018 <20 NOT DETECTED  01/31/2018 <20 NOT DETECTED  04/18/2017 26 (H)   CD4 T Cell Abs (/uL)  Date Value  08/09/2018 190 (L)  01/31/2018 140 (L)  04/18/2017 130 (L)    Review of Systems  Constitutional: Negative for appetite change, chills, fever and unexpected weight change.  Respiratory: Positive for cough.   Gastrointestinal: Negative for constipation and diarrhea.    Genitourinary: Negative for difficulty urinating.  Psychiatric/Behavioral: Positive for dysphoric mood. Negative for sleep disturbance. The patient is nervous/anxious.   Please see HPI. All other systems reviewed and negative.     Objective:   Physical Exam Constitutional:      Appearance: Normal appearance. He is not ill-appearing or toxic-appearing.  HENT:     Mouth/Throat:     Pharynx: No oropharyngeal exudate.  Eyes:     Extraocular Movements: Extraocular movements intact.  Neck:     Musculoskeletal: Normal range of motion and neck supple.  Cardiovascular:     Rate and Rhythm: Normal rate and regular rhythm.  Pulmonary:     Effort: Pulmonary effort is normal.     Breath sounds: Normal breath sounds.  Abdominal:     General: Bowel sounds are normal. There is no distension.     Palpations: Abdomen is soft.     Tenderness: There is no abdominal tenderness.  Musculoskeletal:     Right lower leg: Edema present.     Left lower leg: Edema present.  Skin:    Findings: Rash present.     Comments: eczema  Neurological:     Mental Status: He is alert.  Psychiatric:        Mood and Affect: Mood is anxious. Affect is tearful.       Assessment & Plan:

## 2018-08-24 NOTE — Assessment & Plan Note (Signed)
He appears to be doing well from a lab standpoint.  vax are up to date.  Offered/refused condoms.  Will see him back in 3 months.

## 2018-08-24 NOTE — Assessment & Plan Note (Signed)
We spoke at length about the death of his partner.  I offered to get him in with [facility] and he refuses.  I will let [name] know to contact him.

## 2018-08-24 NOTE — Assessment & Plan Note (Signed)
Encouraged to quit. 

## 2018-08-24 NOTE — Assessment & Plan Note (Signed)
He needs new PCP, will refer him to [facility]

## 2018-09-04 ENCOUNTER — Encounter: Payer: Self-pay | Admitting: Family Medicine

## 2018-09-04 ENCOUNTER — Ambulatory Visit: Payer: Self-pay

## 2018-09-06 ENCOUNTER — Telehealth: Payer: Self-pay

## 2018-09-07 ENCOUNTER — Other Ambulatory Visit: Payer: Self-pay | Admitting: Infectious Diseases

## 2018-09-07 DIAGNOSIS — G629 Polyneuropathy, unspecified: Secondary | ICD-10-CM

## 2018-09-07 MED ORDER — MORPHINE SULFATE ER 40 MG PO CP24: 40.0000 mg | ORAL_CAPSULE | Freq: Two times a day (BID) | ORAL | 0 refills | Status: DC

## 2018-09-07 NOTE — Telephone Encounter (Signed)
I refilled the rx.  I believe it went, through.  Please let me know if not.  thanks

## 2018-09-07 NOTE — Progress Notes (Signed)
Patient requesting refill of chronic opioid medication. [facility] reviewed and last filled on 08/09/2018. He is getting benzodiazapine from another provider but no abnormalities with patterns of dispense.  Orders sent in with next fill on 10/08/18

## 2018-09-07 NOTE — Progress Notes (Unsigned)
I checked his [place] opiod database.  I (or other ID providers) have been his only prescriber for the last year.

## 2018-09-13 ENCOUNTER — Other Ambulatory Visit: Payer: Self-pay | Admitting: Infectious Diseases

## 2018-09-13 DIAGNOSIS — G629 Polyneuropathy, unspecified: Secondary | ICD-10-CM

## 2018-09-13 NOTE — Telephone Encounter (Signed)
We'll have to do this when I come to clinic tomorrow for meeting Thanks jeff

## 2018-09-17 ENCOUNTER — Other Ambulatory Visit: Payer: Self-pay | Admitting: Internal Medicine

## 2018-09-17 ENCOUNTER — Other Ambulatory Visit: Payer: Self-pay | Admitting: Infectious Diseases

## 2018-09-17 DIAGNOSIS — G629 Polyneuropathy, unspecified: Secondary | ICD-10-CM

## 2018-09-17 DIAGNOSIS — B20 Human immunodeficiency virus [HIV] disease: Secondary | ICD-10-CM

## 2018-09-17 MED ORDER — MORPHINE SULFATE ER 40 MG PO CP24: 40.0000 mg | ORAL_CAPSULE | Freq: Two times a day (BID) | ORAL | 0 refills | Status: DC

## 2018-09-17 NOTE — Progress Notes (Signed)
Called his pharmacy and canceled the duplicate morphine rx sent today.

## 2018-09-17 NOTE — Progress Notes (Signed)
Morphine refilled

## 2018-10-11 ENCOUNTER — Other Ambulatory Visit: Payer: Self-pay | Admitting: Infectious Diseases

## 2018-10-11 ENCOUNTER — Telehealth: Payer: Self-pay | Admitting: Behavioral Health

## 2018-10-11 DIAGNOSIS — G629 Polyneuropathy, unspecified: Secondary | ICD-10-CM

## 2018-10-11 NOTE — Telephone Encounter (Signed)
Medication refill request sent from [facility] - [place], [place] - [address].  Medication has to be sent electronically by provider. Angeline Slim RN

## 2018-10-15 ENCOUNTER — Other Ambulatory Visit: Payer: Self-pay | Admitting: Family Medicine

## 2018-10-15 ENCOUNTER — Other Ambulatory Visit: Payer: Self-pay | Admitting: Infectious Diseases

## 2018-10-15 DIAGNOSIS — I952 Hypotension due to drugs: Secondary | ICD-10-CM

## 2018-10-15 DIAGNOSIS — I10 Essential (primary) hypertension: Secondary | ICD-10-CM

## 2018-10-15 DIAGNOSIS — R000 Tachycardia, unspecified: Secondary | ICD-10-CM

## 2018-10-15 DIAGNOSIS — G629 Polyneuropathy, unspecified: Secondary | ICD-10-CM

## 2018-10-15 NOTE — Telephone Encounter (Signed)
Will do tomorrow when in clinic thanks

## 2018-10-15 NOTE — Telephone Encounter (Signed)
Requires electronic refills.

## 2018-10-16 ENCOUNTER — Other Ambulatory Visit: Payer: Self-pay | Admitting: Infectious Diseases

## 2018-10-16 ENCOUNTER — Other Ambulatory Visit: Payer: Self-pay | Admitting: *Deleted

## 2018-10-16 ENCOUNTER — Other Ambulatory Visit: Payer: Self-pay | Admitting: Family

## 2018-10-16 DIAGNOSIS — G629 Polyneuropathy, unspecified: Secondary | ICD-10-CM

## 2018-10-16 DIAGNOSIS — I10 Essential (primary) hypertension: Secondary | ICD-10-CM

## 2018-10-16 MED ORDER — SPIRONOLACTONE 25 MG PO TABS: 25.0000 mg | ORAL_TABLET | Freq: Every day | ORAL | 5 refills | Status: DC

## 2018-10-16 MED ORDER — MORPHINE SULFATE ER 40 MG PO CP24: 40.0000 mg | ORAL_CAPSULE | Freq: Two times a day (BID) | ORAL | 0 refills | Status: DC

## 2018-11-12 ENCOUNTER — Other Ambulatory Visit: Payer: Self-pay

## 2018-11-12 DIAGNOSIS — G629 Polyneuropathy, unspecified: Secondary | ICD-10-CM

## 2018-11-13 ENCOUNTER — Other Ambulatory Visit: Payer: Self-pay | Admitting: Infectious Diseases

## 2018-11-13 DIAGNOSIS — G629 Polyneuropathy, unspecified: Secondary | ICD-10-CM

## 2018-11-13 MED ORDER — MORPHINE SULFATE ER 40 MG PO CP24: 40.0000 mg | ORAL_CAPSULE | Freq: Two times a day (BID) | ORAL | 0 refills | Status: DC

## 2018-11-13 NOTE — Progress Notes (Signed)
Will refill his morphine.  He needs in person appt due to his interactions with staff.  [place] controlled subs database reviewed.

## 2018-11-15 ENCOUNTER — Ambulatory Visit: Payer: Medicare Other | Admitting: Infectious Diseases

## 2018-11-30 ENCOUNTER — Ambulatory Visit: Payer: Medicare Other | Admitting: Infectious Diseases

## 2018-12-12 ENCOUNTER — Other Ambulatory Visit: Payer: Self-pay | Admitting: Infectious Diseases

## 2018-12-12 DIAGNOSIS — G629 Polyneuropathy, unspecified: Secondary | ICD-10-CM

## 2018-12-12 NOTE — Telephone Encounter (Signed)
Controlled substance refill request. Landis Gandy, RN

## 2018-12-20 ENCOUNTER — Ambulatory Visit: Payer: Medicare Other | Admitting: Infectious Diseases

## 2018-12-24 ENCOUNTER — Telehealth: Payer: Self-pay | Admitting: Infectious Diseases

## 2018-12-24 NOTE — Telephone Encounter (Signed)
COVID-19 Pre-Screening Questions: ° °Do you currently have a fever (>100 °F), chills or unexplained body aches? No  ° °Are you currently experiencing new cough, shortness of breath, sore throat, runny nose? No  °•  °Have you recently travelled outside the state of [place] in the last 14 days? No  °•  °1. Have you been in contact with someone that is currently pending confirmation of Covid19 testing or has been confirmed to have the Covid19 virus?  No  ° °

## 2018-12-25 ENCOUNTER — Encounter: Payer: Self-pay | Admitting: Infectious Diseases

## 2018-12-25 ENCOUNTER — Ambulatory Visit (INDEPENDENT_AMBULATORY_CARE_PROVIDER_SITE_OTHER): Payer: Medicare Other | Admitting: Infectious Diseases

## 2018-12-25 ENCOUNTER — Other Ambulatory Visit: Payer: Self-pay

## 2018-12-25 VITALS — BP 112/71 | HR 97 | Temp 98.0°F | Ht 75.0 in | Wt 193.0 lb

## 2018-12-25 DIAGNOSIS — E1142 Type 2 diabetes mellitus with diabetic polyneuropathy: Secondary | ICD-10-CM

## 2018-12-25 DIAGNOSIS — B20 Human immunodeficiency virus [HIV] disease: Secondary | ICD-10-CM

## 2018-12-25 DIAGNOSIS — J449 Chronic obstructive pulmonary disease, unspecified: Secondary | ICD-10-CM | POA: Diagnosis not present

## 2018-12-25 DIAGNOSIS — R110 Nausea: Secondary | ICD-10-CM

## 2018-12-25 DIAGNOSIS — Z79899 Other long term (current) drug therapy: Secondary | ICD-10-CM

## 2018-12-25 DIAGNOSIS — F17210 Nicotine dependence, cigarettes, uncomplicated: Secondary | ICD-10-CM

## 2018-12-25 DIAGNOSIS — W19XXXD Unspecified fall, subsequent encounter: Secondary | ICD-10-CM

## 2018-12-25 DIAGNOSIS — Z113 Encounter for screening for infections with a predominantly sexual mode of transmission: Secondary | ICD-10-CM | POA: Diagnosis not present

## 2018-12-25 MED ORDER — PROMETHAZINE HCL 25 MG PO TABS: 25.0000 mg | ORAL_TABLET | Freq: Three times a day (TID) | ORAL | 4 refills | Status: AC | PRN

## 2018-12-25 NOTE — Assessment & Plan Note (Signed)
Encouraged to quit. 

## 2018-12-25 NOTE — Assessment & Plan Note (Signed)
He is doing well on his current rx His wt is stable.  No change in his ART.  Offered/refused condoms. He has ED.  rtc in 6 months

## 2018-12-25 NOTE — Assessment & Plan Note (Signed)
Encouraged him to f/u with [facility] Encouraged him to watch his diet.

## 2018-12-25 NOTE — Assessment & Plan Note (Signed)
Asked him to f/u with Pulm for his COPD He is aware he needs to quit smoking.

## 2018-12-25 NOTE — Progress Notes (Signed)
   Subjective:    Patient ID: RUSHTON EARLY, male    DOB: 02-26-1961, 58 y.o.   MRN: [id number]  HPI 58yo M with neuropathy, HIV/AIDS, DM2 (diet controlled), suspected Marfan's syndrome, and COPD. He was started on complera 2012. Then changed to tivicay/Descovy 06-2015, which he continues on. Was in hospital 09-2013 with sustained VT. His cath was normal.  His last Echo was6-2019 - Left ventricle: The cavity size was normal. Wall thickness was   increased in a pattern of mild LVH. Systolic function was normal.   The estimated ejection fraction was in the range of 55% to 60%.   Abnormal diastolic function, indeterminant grade. - Aortic valve: Valve area (VTI): 4.25 cm^2. Valve area (Vmax):   4.25 cm^2. Valve area (Vmean): 4.05 cm^2. - Mitral valve: Mildly to moderately calcified annulus. There was   mild regurgitation. - Left atrium: The atrium was moderately dilated. - Technically difficult study.  COPD: Continues tosmokecigarettes after e-cig were too expensive. He had CXR chest 11-9-18which showed bullous changes. He haspulmonary f/u.Currently at 3/4 ppd.  His exercise capacity is less from previous. He has been walking for exercise but he can no longer "sprint".   His nephew is living with him. Feels he is irresponsible with money. Mr Dimmick does all the cooking, laundry.  Has someone who helps with his section 8.   He is trying to get appt at [facility]. Awaiting next stage of COVID.  HIV 1 RNA Quant (copies/mL)  Date Value  08/09/2018 <20 NOT DETECTED  01/31/2018 <20 NOT DETECTED  04/18/2017 26 (H)   CD4 T Cell Abs (/uL)  Date Value  08/09/2018 190 (L)  01/31/2018 140 (L)  04/18/2017 130 (L)    Review of Systems  Constitutional: Negative for appetite change, chills, fever and unexpected weight change.  Respiratory: Positive for cough and shortness of breath.   Gastrointestinal: Negative for constipation and diarrhea.  Genitourinary: Negative for difficulty urinating.   Psychiatric/Behavioral: Negative for sleep disturbance.  Tmax 99.x.  Please see HPI. All other systems reviewed and negative.    Objective:   Physical Exam Constitutional:      Appearance: He is ill-appearing. He is not toxic-appearing.  HENT:     Mouth/Throat:     Mouth: Mucous membranes are moist.     Pharynx: No oropharyngeal exudate.  Eyes:     Extraocular Movements: Extraocular movements intact.     Pupils: Pupils are equal, round, and reactive to light.  Neck:     Musculoskeletal: Normal range of motion and neck supple.  Cardiovascular:     Rate and Rhythm: Normal rate and regular rhythm.  Pulmonary:     Effort: Pulmonary effort is normal.     Breath sounds: Normal breath sounds.  Abdominal:     General: Bowel sounds are normal. There is no distension.     Palpations: Abdomen is soft.     Tenderness: There is no abdominal tenderness.  Musculoskeletal: Normal range of motion.     Right lower leg: Edema present.     Left lower leg: Edema present.  Neurological:     General: No focal deficit present.     Mental Status: He is alert.  Psychiatric:        Mood and Affect: Mood normal.   trace LE edema.      Assessment & Plan:

## 2018-12-25 NOTE — Assessment & Plan Note (Addendum)
No further falls.  Has had 2 dizzy spells, no LOC.

## 2019-01-09 ENCOUNTER — Other Ambulatory Visit: Payer: Self-pay | Admitting: Internal Medicine

## 2019-01-09 ENCOUNTER — Other Ambulatory Visit: Payer: Self-pay | Admitting: Infectious Diseases

## 2019-01-09 ENCOUNTER — Other Ambulatory Visit: Payer: Self-pay

## 2019-01-09 DIAGNOSIS — B20 Human immunodeficiency virus [HIV] disease: Secondary | ICD-10-CM

## 2019-01-09 MED ORDER — TIVICAY 50 MG PO TABS: 50.0000 mg | ORAL_TABLET | Freq: Every day | ORAL | 5 refills | Status: DC

## 2019-01-09 MED ORDER — DESCOVY 200-25 MG PO TABS: 1.0000 | ORAL_TABLET | Freq: Every day | ORAL | 5 refills | Status: DC

## 2019-01-09 NOTE — Telephone Encounter (Signed)
Give x 3 m supply then either here or pcp for refills

## 2019-01-09 NOTE — Telephone Encounter (Signed)
Instructions from OV 02/14/18  Plan A = Automatic = Bevespi Take 2 puffs first thing in am and then another 2 puffs about 12 hours later.   Work on inhaler technique:  relax and gently blow all the way out then take a nice smooth deep breath back in, triggering the inhaler at same time you start breathing in.  Hold for up to 5 seconds if you can. Blow out thru nose. Rinse and gargle with water when done     Plan B = Backup Only use your albuterol as a rescue medication to be used if you can't catch your breath by resting or doing a relaxed purse lip breathing pattern.  - The less you use it, the better it will work when you need it. - Ok to use the inhaler up to 2 puffs  every 4 hours if you must but call for appointment if use goes up over your usual need - Don't leave home without it !!  (think of it like the spare tire for your car)     If you are satisfied with your treatment plan,  let your doctor know and he/she can either refill your medications or you can return here when your prescription runs out.     If in any way you are not 100% satisfied,  please tell us.  If 100% better, tell your friends!  Pulmonary follow up is as needed       Dr.Wert, please advise if you are okay with [place] refilling pt's inhaler or if PCP needs to refill med for pt. Thanks!

## 2019-01-15 ENCOUNTER — Other Ambulatory Visit: Payer: Self-pay

## 2019-01-15 ENCOUNTER — Other Ambulatory Visit: Payer: Self-pay | Admitting: Infectious Diseases

## 2019-01-15 DIAGNOSIS — G629 Polyneuropathy, unspecified: Secondary | ICD-10-CM

## 2019-01-15 DIAGNOSIS — Z515 Encounter for palliative care: Secondary | ICD-10-CM | POA: Insufficient documentation

## 2019-01-15 MED ORDER — MORPHINE SULFATE ER 40 MG PO CP24: 1.0000 | ORAL_CAPSULE | Freq: Two times a day (BID) | ORAL | 0 refills | Status: DC

## 2019-01-15 NOTE — Progress Notes (Signed)
[facility] N[facility] DB reviewed

## 2019-02-14 ENCOUNTER — Telehealth: Payer: Self-pay

## 2019-02-14 NOTE — Telephone Encounter (Signed)
Patient called office today requesting refills for Kadian 40 mg be sent to the pharmacy. Patient states pharmacy will need prescription called in no later then Wednesday morning to have prescription delivered on time. Will route message to MD. Aundria Rud, [place]

## 2019-02-18 ENCOUNTER — Other Ambulatory Visit: Payer: Self-pay | Admitting: Infectious Diseases

## 2019-02-18 DIAGNOSIS — G629 Polyneuropathy, unspecified: Secondary | ICD-10-CM

## 2019-02-18 NOTE — Telephone Encounter (Signed)
Will write when in clinic tomorrow thanks

## 2019-02-21 ENCOUNTER — Other Ambulatory Visit: Payer: Self-pay | Admitting: Infectious Diseases

## 2019-02-21 DIAGNOSIS — Z515 Encounter for palliative care: Secondary | ICD-10-CM

## 2019-02-21 DIAGNOSIS — G629 Polyneuropathy, unspecified: Secondary | ICD-10-CM

## 2019-02-21 DIAGNOSIS — B20 Human immunodeficiency virus [HIV] disease: Secondary | ICD-10-CM

## 2019-02-21 MED ORDER — MORPHINE SULFATE ER 40 MG PO CP24: 1.0000 | ORAL_CAPSULE | Freq: Two times a day (BID) | ORAL | 0 refills | Status: DC

## 2019-02-21 NOTE — Progress Notes (Signed)
I have reviewed the [place] controlled [place], I/this clinic is the onlyc narcotic prescriber for this pt.

## 2019-03-26 ENCOUNTER — Other Ambulatory Visit: Payer: Self-pay

## 2019-03-26 ENCOUNTER — Other Ambulatory Visit: Payer: Self-pay | Admitting: Infectious Diseases

## 2019-03-26 DIAGNOSIS — B20 Human immunodeficiency virus [HIV] disease: Secondary | ICD-10-CM

## 2019-03-26 DIAGNOSIS — Z515 Encounter for palliative care: Secondary | ICD-10-CM

## 2019-03-26 DIAGNOSIS — G629 Polyneuropathy, unspecified: Secondary | ICD-10-CM

## 2019-03-26 MED ORDER — MORPHINE SULFATE ER 40 MG PO CP24: 1.0000 | ORAL_CAPSULE | Freq: Two times a day (BID) | ORAL | 0 refills | Status: DC

## 2019-03-26 NOTE — Progress Notes (Signed)
I have reviewed his medication hx at [facility] controlled subs database.  I am his only prescriber for the last 2 months.

## 2019-03-28 ENCOUNTER — Ambulatory Visit: Payer: Medicare Other

## 2019-04-11 ENCOUNTER — Other Ambulatory Visit: Payer: Self-pay | Admitting: Family Medicine

## 2019-04-11 ENCOUNTER — Other Ambulatory Visit: Payer: Self-pay | Admitting: Internal Medicine

## 2019-04-11 ENCOUNTER — Other Ambulatory Visit: Payer: Self-pay | Admitting: Infectious Diseases

## 2019-04-11 DIAGNOSIS — I10 Essential (primary) hypertension: Secondary | ICD-10-CM

## 2019-04-11 DIAGNOSIS — R000 Tachycardia, unspecified: Secondary | ICD-10-CM

## 2019-04-11 DIAGNOSIS — I952 Hypotension due to drugs: Secondary | ICD-10-CM

## 2019-04-24 ENCOUNTER — Ambulatory Visit: Payer: Medicare Other

## 2019-04-26 ENCOUNTER — Telehealth: Payer: Self-pay

## 2019-04-26 NOTE — Telephone Encounter (Signed)
Thanks  Will refill on Monday when in clinic Thanks jeff

## 2019-04-26 NOTE — Telephone Encounter (Signed)
Patient calling to request refill for Morphine. Routing to provider to send electronically with finger print signature. [name]

## 2019-04-29 ENCOUNTER — Other Ambulatory Visit: Payer: Self-pay | Admitting: Infectious Diseases

## 2019-04-29 DIAGNOSIS — G629 Polyneuropathy, unspecified: Secondary | ICD-10-CM

## 2019-04-29 DIAGNOSIS — Z515 Encounter for palliative care: Secondary | ICD-10-CM

## 2019-04-29 DIAGNOSIS — B20 Human immunodeficiency virus [HIV] disease: Secondary | ICD-10-CM

## 2019-04-29 MED ORDER — MORPHINE SULFATE ER 40 MG PO CP24: 1.0000 | ORAL_CAPSULE | Freq: Two times a day (BID) | ORAL | 0 refills | Status: DC

## 2019-04-30 ENCOUNTER — Other Ambulatory Visit: Payer: Self-pay | Admitting: Infectious Diseases

## 2019-04-30 ENCOUNTER — Other Ambulatory Visit: Payer: Self-pay

## 2019-04-30 ENCOUNTER — Ambulatory Visit: Payer: Medicare Other

## 2019-04-30 DIAGNOSIS — B20 Human immunodeficiency virus [HIV] disease: Secondary | ICD-10-CM

## 2019-04-30 DIAGNOSIS — G629 Polyneuropathy, unspecified: Secondary | ICD-10-CM

## 2019-04-30 DIAGNOSIS — Z515 Encounter for palliative care: Secondary | ICD-10-CM

## 2019-04-30 MED ORDER — MORPHINE SULFATE ER 40 MG PO CP24: 1.0000 | ORAL_CAPSULE | Freq: Two times a day (BID) | ORAL | 0 refills | Status: DC

## 2019-05-06 MED ORDER — MORPHINE SULFATE ER 40 MG PO CP24: 1.0000 | ORAL_CAPSULE | Freq: Two times a day (BID) | ORAL | 0 refills | Status: DC

## 2019-05-28 ENCOUNTER — Ambulatory Visit: Payer: Medicare Other | Admitting: Podiatry

## 2019-05-29 ENCOUNTER — Ambulatory Visit: Payer: Medicare Other | Admitting: Podiatry

## 2019-06-03 ENCOUNTER — Other Ambulatory Visit: Payer: Self-pay | Admitting: Infectious Diseases

## 2019-06-03 DIAGNOSIS — G629 Polyneuropathy, unspecified: Secondary | ICD-10-CM

## 2019-06-03 DIAGNOSIS — Z515 Encounter for palliative care: Secondary | ICD-10-CM

## 2019-06-03 DIAGNOSIS — B20 Human immunodeficiency virus [HIV] disease: Secondary | ICD-10-CM

## 2019-06-03 NOTE — Telephone Encounter (Signed)
Refill request.   Thanks   Let us know once it's done.

## 2019-06-04 ENCOUNTER — Other Ambulatory Visit: Payer: Self-pay | Admitting: Infectious Diseases

## 2019-06-04 DIAGNOSIS — Z515 Encounter for palliative care: Secondary | ICD-10-CM

## 2019-06-04 DIAGNOSIS — B20 Human immunodeficiency virus [HIV] disease: Secondary | ICD-10-CM

## 2019-06-04 DIAGNOSIS — G629 Polyneuropathy, unspecified: Secondary | ICD-10-CM

## 2019-06-04 MED ORDER — MORPHINE SULFATE ER 40 MG PO CP24: 1.0000 | ORAL_CAPSULE | Freq: Two times a day (BID) | ORAL | 0 refills | Status: DC

## 2019-06-11 ENCOUNTER — Other Ambulatory Visit: Payer: Medicare Other

## 2019-06-18 ENCOUNTER — Telehealth: Payer: Self-pay

## 2019-06-18 ENCOUNTER — Other Ambulatory Visit: Payer: Medicare Other

## 2019-06-18 ENCOUNTER — Other Ambulatory Visit (HOSPITAL_COMMUNITY)
Admission: RE | Admit: 2019-06-18 | Discharge: 2019-06-18 | Disposition: A | Discharge status: Home / Self Care | Payer: Medicare Other | Source: Ambulatory Visit | Attending: Infectious Diseases | Admitting: Infectious Diseases

## 2019-06-18 ENCOUNTER — Other Ambulatory Visit: Payer: Self-pay

## 2019-06-18 DIAGNOSIS — Z79899 Other long term (current) drug therapy: Secondary | ICD-10-CM

## 2019-06-18 DIAGNOSIS — Z113 Encounter for screening for infections with a predominantly sexual mode of transmission: Secondary | ICD-10-CM | POA: Insufficient documentation

## 2019-06-18 DIAGNOSIS — B20 Human immunodeficiency virus [HIV] disease: Secondary | ICD-10-CM

## 2019-06-18 NOTE — Addendum Note (Signed)
Addended by: Monia Sabal on: 06/18/2019 03:29 PM   Modules accepted: Orders

## 2019-06-18 NOTE — Telephone Encounter (Signed)
-----   Message from [facility] sent at 06/18/2019  3:33 PM EST ----- Patient came in for lab and just wanted to leave a message for Dr. Johnnye Sima, If for this one time could he send in a prescription for testosterone to his pharmacy. Michela Pitcher it helps with mentality and energy, his PCP has left [facility] and that he wouldn't be able to look for one until next year 2021, if someone could give him a call at [phone].

## 2019-06-18 NOTE — Telephone Encounter (Signed)
Patient came in for lab and just wanted to leave a message for Dr. Johnnye Sima, If for this one time could he send in a prescription for testosterone to his pharmacy. Michela Pitcher it helps with mentality and energy, his PCP has left [facility] and that he wouldn't be able to look for one until next year [place]

## 2019-06-19 ENCOUNTER — Other Ambulatory Visit: Payer: Self-pay | Admitting: Infectious Diseases

## 2019-06-19 ENCOUNTER — Telehealth: Payer: Self-pay

## 2019-06-19 DIAGNOSIS — E349 Endocrine disorder, unspecified: Secondary | ICD-10-CM

## 2019-06-19 LAB — T-HELPER CELL (CD4) - (RCID CLINIC ONLY)
CD4 % Helper T Cell: 13 % — ABNORMAL LOW (ref 33–65)
CD4 T Cell Abs: 210 /uL — ABNORMAL LOW (ref 400–1790)

## 2019-06-19 LAB — URINE CYTOLOGY ANCILLARY ONLY
Chlamydia: NEGATIVE
Comment: NEGATIVE
Comment: NORMAL
Neisseria Gonorrhea: NEGATIVE

## 2019-06-19 NOTE — Telephone Encounter (Signed)
-----   Message from Aileen Cruz sent at 06/18/2019  3:33 PM EST ----- Patient came in for lab and just wanted to leave a message for Dr. Hatcher, If for this one time could he send in a prescription for testosterone to his pharmacy. Said it helps with mentality and energy, his PCP has left [facility] and that he wouldn't be able to look for one until next year 2021, if someone could give him a call at [phone].   

## 2019-06-20 ENCOUNTER — Other Ambulatory Visit: Payer: Self-pay | Admitting: Infectious Diseases

## 2019-06-20 DIAGNOSIS — E349 Endocrine disorder, unspecified: Secondary | ICD-10-CM

## 2019-06-20 MED ORDER — TESTOSTERONE 50 MG/5GM (1%) TD GEL: 5.0000 g | Freq: Every day | TRANSDERMAL | 3 refills | Status: AC

## 2019-06-25 ENCOUNTER — Ambulatory Visit (INDEPENDENT_AMBULATORY_CARE_PROVIDER_SITE_OTHER): Payer: Medicare Other | Admitting: Infectious Diseases

## 2019-06-25 ENCOUNTER — Encounter: Payer: Self-pay | Admitting: *Deleted

## 2019-06-25 ENCOUNTER — Other Ambulatory Visit: Payer: Self-pay

## 2019-06-25 ENCOUNTER — Encounter: Payer: Self-pay | Admitting: Infectious Diseases

## 2019-06-25 VITALS — BP 103/69 | HR 86 | Temp 98.2°F

## 2019-06-25 DIAGNOSIS — J449 Chronic obstructive pulmonary disease, unspecified: Secondary | ICD-10-CM

## 2019-06-25 DIAGNOSIS — E1142 Type 2 diabetes mellitus with diabetic polyneuropathy: Secondary | ICD-10-CM | POA: Diagnosis not present

## 2019-06-25 DIAGNOSIS — B20 Human immunodeficiency virus [HIV] disease: Secondary | ICD-10-CM | POA: Diagnosis not present

## 2019-06-25 DIAGNOSIS — Z113 Encounter for screening for infections with a predominantly sexual mode of transmission: Secondary | ICD-10-CM

## 2019-06-25 DIAGNOSIS — Z515 Encounter for palliative care: Secondary | ICD-10-CM

## 2019-06-25 DIAGNOSIS — H6691 Otitis media, unspecified, right ear: Secondary | ICD-10-CM

## 2019-06-25 DIAGNOSIS — B351 Tinea unguium: Secondary | ICD-10-CM

## 2019-06-25 DIAGNOSIS — T7491XA Unspecified adult maltreatment, confirmed, initial encounter: Secondary | ICD-10-CM

## 2019-06-25 DIAGNOSIS — G629 Polyneuropathy, unspecified: Secondary | ICD-10-CM

## 2019-06-25 DIAGNOSIS — Z79899 Other long term (current) drug therapy: Secondary | ICD-10-CM

## 2019-06-25 DIAGNOSIS — I87323 Chronic venous hypertension (idiopathic) with inflammation of bilateral lower extremity: Secondary | ICD-10-CM

## 2019-06-25 DIAGNOSIS — H6690 Otitis media, unspecified, unspecified ear: Secondary | ICD-10-CM | POA: Insufficient documentation

## 2019-06-25 MED ORDER — MORPHINE SULFATE ER 40 MG PO CP24: 1.0000 | ORAL_CAPSULE | Freq: Two times a day (BID) | ORAL | 0 refills | Status: AC

## 2019-06-25 NOTE — Assessment & Plan Note (Signed)
CD4 slightly better Has gotten flu shot.  Has housing, will persue new housing.  PCV23 rtc in 9 months with labs prior.

## 2019-06-25 NOTE — Addendum Note (Signed)
Addended by: Illyanna Petillo C on: 06/25/2019 03:23 PM   Modules accepted: Orders

## 2019-06-25 NOTE — Assessment & Plan Note (Signed)
He will f/u with ENT.

## 2019-06-25 NOTE — Assessment & Plan Note (Signed)
Will continue to watch.  He appears stable.

## 2019-06-25 NOTE — Assessment & Plan Note (Signed)
His most recent Glc is normal.  Will continue to watch.

## 2019-06-25 NOTE — Assessment & Plan Note (Signed)
Encouraged to quit smoking.  
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
## 2019-06-25 NOTE — Progress Notes (Signed)
[place], [place] [phone] (Phone) [phone] (Fax)   Pharmacy preference per patient. Landis Gandy, RN

## 2019-06-25 NOTE — Assessment & Plan Note (Signed)
Will send him to the foot center.
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
## 2019-07-02 LAB — RPR: RPR Ser Ql: NONREACTIVE

## 2019-07-02 LAB — COMPREHENSIVE METABOLIC PANEL
AG Ratio: 1.5 (calc) (ref 1.0–2.5)
ALT: 7 U/L — ABNORMAL LOW (ref 9–46)
AST: 9 U/L — ABNORMAL LOW (ref 10–35)
Albumin: 4.3 g/dL (ref 3.6–5.1)
Alkaline phosphatase (APISO): 102 U/L (ref 35–144)
BUN: 15 mg/dL (ref 7–25)
CO2: 23 mmol/L (ref 20–32)
Calcium: 9.6 mg/dL (ref 8.6–10.3)
Chloride: 104 mmol/L (ref 98–110)
Creat: 1.1 mg/dL (ref 0.70–1.33)
Globulin: 2.9 g/dL (calc) (ref 1.9–3.7)
Glucose, Bld: 90 mg/dL (ref 65–99)
Potassium: 4 mmol/L (ref 3.5–5.3)
Sodium: 139 mmol/L (ref 135–146)
Total Bilirubin: 0.5 mg/dL (ref 0.2–1.2)
Total Protein: 7.2 g/dL (ref 6.1–8.1)

## 2019-07-02 LAB — CBC
HCT: 43.3 % (ref 38.5–50.0)
Hemoglobin: 15 g/dL (ref 13.2–17.1)
MCH: 30.5 pg (ref 27.0–33.0)
MCHC: 34.6 g/dL (ref 32.0–36.0)
MCV: 88.2 fL (ref 80.0–100.0)
MPV: 10.1 fL (ref 7.5–12.5)
Platelets: 217 10*3/uL (ref 140–400)
RBC: 4.91 10*6/uL (ref 4.20–5.80)
RDW: 13.3 % (ref 11.0–15.0)
WBC: 5.8 10*3/uL (ref 3.8–10.8)

## 2019-07-02 LAB — LIPID PANEL
Cholesterol: 181 mg/dL (ref <200)
HDL: 36 mg/dL — ABNORMAL LOW (ref >=40)
LDL Cholesterol (Calc): 120 mg/dL (calc) — ABNORMAL HIGH
Non-HDL Cholesterol (Calc): 145 mg/dL (calc) — ABNORMAL HIGH (ref <130)
Total CHOL/HDL Ratio: 5 (calc) — ABNORMAL HIGH (ref <5.0)
Triglycerides: 134 mg/dL (ref <150)

## 2019-07-02 LAB — HIV-1 RNA QUANT-NO REFLEX-BLD
HIV 1 RNA Quant: <20 copies/mL
HIV-1 RNA Quant, Log: <1.3 Log copies/mL

## 2019-07-03 ENCOUNTER — Other Ambulatory Visit: Payer: Self-pay

## 2019-07-03 ENCOUNTER — Ambulatory Visit (INDEPENDENT_AMBULATORY_CARE_PROVIDER_SITE_OTHER): Payer: Medicare Other | Admitting: Podiatry

## 2019-07-03 ENCOUNTER — Encounter: Payer: Self-pay | Admitting: Podiatry

## 2019-07-03 DIAGNOSIS — B351 Tinea unguium: Secondary | ICD-10-CM

## 2019-07-03 DIAGNOSIS — M79675 Pain in left toe(s): Secondary | ICD-10-CM

## 2019-07-03 DIAGNOSIS — M79674 Pain in right toe(s): Secondary | ICD-10-CM

## 2019-07-03 DIAGNOSIS — E1142 Type 2 diabetes mellitus with diabetic polyneuropathy: Secondary | ICD-10-CM

## 2019-07-03 NOTE — Progress Notes (Signed)
This patient presents the office with chief complaint of long thick painful toenails especially both big toes.  He says these nails are painful walking and wearing his shoes.  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  He presents the office requesting an evaluation and treatment and possibly nail removal for both great toes.  Patient has not been seen in this office for 14 months.    General Appearance  Alert, conversant and in no acute stress.  Vascular  Dorsalis pedis and posterior tibial  pulses are palpable  bilaterally.  Capillary return is within normal limits  bilaterally. Temperature is within normal limits  bilaterally.  Neurologic  Senn-Weinstein monofilament wire test diminished   bilaterally. Muscle power diminished  bilaterally.  Nails Thick disfigured discolored nails with subungual debris  from hallux to fifth toes bilaterally. No evidence of bacterial infection or drainage bilaterally.  Orthopedic  No limitations of motion  feet .  No crepitus or effusions noted.  No bony pathology or digital deformities noted.  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  Skin  normotropic skin with no porokeratosis noted bilaterally.  No signs of infections or ulcers noted.    Onychomycosis  B/L.  ROV.  Debridement of nails  X 10.  Discussed this condition with this patient.  Recommended we treat him with conservative treatment and have him come back every 3 months for treatment.  If the nails persist and right long and painful then we will consider surgical correction.  RTC 3 months.   Gardiner Barefoot DPM

## 2019-07-10 ENCOUNTER — Other Ambulatory Visit: Payer: Self-pay | Admitting: Infectious Diseases

## 2019-07-10 DIAGNOSIS — B20 Human immunodeficiency virus [HIV] disease: Secondary | ICD-10-CM

## 2019-07-11 ENCOUNTER — Other Ambulatory Visit: Payer: Self-pay

## 2019-07-11 DIAGNOSIS — B20 Human immunodeficiency virus [HIV] disease: Secondary | ICD-10-CM

## 2019-07-11 MED ORDER — DESCOVY 200-25 MG PO TABS: 1.0000 | ORAL_TABLET | Freq: Every day | ORAL | 3 refills | Status: AC

## 2019-07-11 MED ORDER — TIVICAY 50 MG PO TABS: 50.0000 mg | ORAL_TABLET | Freq: Every day | ORAL | 3 refills | Status: AC

## 2019-07-11 MED ORDER — DESCOVY 200-25 MG PO TABS: 1.0000 | ORAL_TABLET | Freq: Every day | ORAL | 3 refills | Status: DC

## 2019-07-11 MED ORDER — TIVICAY 50 MG PO TABS: 50.0000 mg | ORAL_TABLET | Freq: Every day | ORAL | 3 refills | Status: DC

## 2019-08-05 DEATH — deceased

## 2019-10-02 ENCOUNTER — Ambulatory Visit: Payer: Medicare Other | Admitting: Podiatry
